# Patient Record
Sex: Female | Born: 1935 | Race: White | Hispanic: No | Smoking: Never smoker
Health system: Southern US, Community
[De-identification: ages and names within clinical notes are randomized; demographics above are authoritative.]

## PROBLEM LIST (undated history)

## (undated) DIAGNOSIS — F0280 Dementia in other diseases classified elsewhere without behavioral disturbance: Secondary | ICD-10-CM

## (undated) DIAGNOSIS — K5730 Diverticulosis of large intestine without perforation or abscess without bleeding: Secondary | ICD-10-CM

## (undated) DIAGNOSIS — E039 Hypothyroidism, unspecified: Secondary | ICD-10-CM

## (undated) DIAGNOSIS — I872 Venous insufficiency (chronic) (peripheral): Secondary | ICD-10-CM

## (undated) DIAGNOSIS — K5900 Constipation, unspecified: Secondary | ICD-10-CM

## (undated) DIAGNOSIS — E042 Nontoxic multinodular goiter: Secondary | ICD-10-CM

## (undated) DIAGNOSIS — I2510 Atherosclerotic heart disease of native coronary artery without angina pectoris: Secondary | ICD-10-CM

## (undated) DIAGNOSIS — R413 Other amnesia: Secondary | ICD-10-CM

## (undated) DIAGNOSIS — I10 Essential (primary) hypertension: Secondary | ICD-10-CM

## (undated) DIAGNOSIS — J45909 Unspecified asthma, uncomplicated: Secondary | ICD-10-CM

## (undated) DIAGNOSIS — G309 Alzheimer's disease, unspecified: Secondary | ICD-10-CM

## (undated) DIAGNOSIS — R269 Unspecified abnormalities of gait and mobility: Secondary | ICD-10-CM

## (undated) DIAGNOSIS — K219 Gastro-esophageal reflux disease without esophagitis: Secondary | ICD-10-CM

## (undated) DIAGNOSIS — M1990 Unspecified osteoarthritis, unspecified site: Secondary | ICD-10-CM

## (undated) DIAGNOSIS — E559 Vitamin D deficiency, unspecified: Secondary | ICD-10-CM

## (undated) DIAGNOSIS — E7800 Pure hypercholesterolemia, unspecified: Secondary | ICD-10-CM

## (undated) DIAGNOSIS — E785 Hyperlipidemia, unspecified: Secondary | ICD-10-CM

## (undated) DIAGNOSIS — K8020 Calculus of gallbladder without cholecystitis without obstruction: Secondary | ICD-10-CM

## (undated) DIAGNOSIS — N390 Urinary tract infection, site not specified: Secondary | ICD-10-CM

## (undated) DIAGNOSIS — G459 Transient cerebral ischemic attack, unspecified: Secondary | ICD-10-CM

## (undated) DIAGNOSIS — I671 Cerebral aneurysm, nonruptured: Secondary | ICD-10-CM

## (undated) DIAGNOSIS — F419 Anxiety disorder, unspecified: Secondary | ICD-10-CM

## (undated) DIAGNOSIS — Z9181 History of falling: Secondary | ICD-10-CM

## (undated) DIAGNOSIS — E876 Hypokalemia: Secondary | ICD-10-CM

## (undated) DIAGNOSIS — D649 Anemia, unspecified: Secondary | ICD-10-CM

## (undated) HISTORY — DX: Unspecified osteoarthritis, unspecified site: M19.90

## (undated) HISTORY — DX: Pure hypercholesterolemia, unspecified: E78.00

## (undated) HISTORY — DX: Other amnesia: R41.3

## (undated) HISTORY — DX: Transient cerebral ischemic attack, unspecified: G45.9

## (undated) HISTORY — DX: Hypothyroidism, unspecified: E03.9

## (undated) HISTORY — DX: Essential (primary) hypertension: I10

## (undated) HISTORY — DX: Cerebral aneurysm, nonruptured: I67.1

## (undated) HISTORY — DX: Atherosclerotic heart disease of native coronary artery without angina pectoris: I25.10

## (undated) HISTORY — PX: ABDOMINAL HYSTERECTOMY: SHX81

## (undated) HISTORY — DX: Anemia, unspecified: D64.9

## (undated) HISTORY — DX: Venous insufficiency (chronic) (peripheral): I87.2

## (undated) HISTORY — PX: OTHER SURGICAL HISTORY: SHX169

## (undated) HISTORY — DX: Urinary tract infection, site not specified: N39.0

## (undated) HISTORY — DX: Anxiety disorder, unspecified: F41.9

## (undated) HISTORY — DX: Unspecified asthma, uncomplicated: J45.909

## (undated) HISTORY — DX: Nontoxic multinodular goiter: E04.2

## (undated) HISTORY — PX: LUMBAR FUSION: SHX111

## (undated) HISTORY — DX: Gastro-esophageal reflux disease without esophagitis: K21.9

## (undated) HISTORY — DX: Diverticulosis of large intestine without perforation or abscess without bleeding: K57.30

---

## 1998-09-09 ENCOUNTER — Ambulatory Visit (HOSPITAL_COMMUNITY): Admission: RE | Admit: 1998-09-09 | Discharge: 1998-09-09 | Payer: Self-pay | Admitting: Gastroenterology

## 1998-10-17 ENCOUNTER — Ambulatory Visit (HOSPITAL_COMMUNITY): Admission: RE | Admit: 1998-10-17 | Discharge: 1998-10-17 | Payer: Self-pay | Admitting: Gastroenterology

## 1998-10-17 ENCOUNTER — Encounter: Payer: Self-pay | Admitting: Gastroenterology

## 1999-10-16 ENCOUNTER — Emergency Department (HOSPITAL_COMMUNITY): Admission: EM | Admit: 1999-10-16 | Discharge: 1999-10-16 | Payer: Self-pay | Admitting: Emergency Medicine

## 1999-10-31 ENCOUNTER — Emergency Department (HOSPITAL_COMMUNITY): Admission: EM | Admit: 1999-10-31 | Discharge: 1999-10-31 | Payer: Self-pay | Admitting: Emergency Medicine

## 1999-11-05 HISTORY — PX: OTHER SURGICAL HISTORY: SHX169

## 1999-11-06 ENCOUNTER — Encounter: Payer: Self-pay | Admitting: Orthopedic Surgery

## 1999-11-13 ENCOUNTER — Inpatient Hospital Stay (HOSPITAL_COMMUNITY): Admission: RE | Admit: 1999-11-13 | Discharge: 1999-11-18 | Payer: Self-pay | Admitting: Orthopedic Surgery

## 1999-11-14 ENCOUNTER — Encounter: Payer: Self-pay | Admitting: Orthopedic Surgery

## 1999-11-15 ENCOUNTER — Encounter: Payer: Self-pay | Admitting: Pulmonary Disease

## 2000-03-01 ENCOUNTER — Other Ambulatory Visit: Admission: RE | Admit: 2000-03-01 | Discharge: 2000-03-01 | Payer: Self-pay | Admitting: Gynecology

## 2001-03-10 ENCOUNTER — Other Ambulatory Visit: Admission: RE | Admit: 2001-03-10 | Discharge: 2001-03-10 | Payer: Self-pay | Admitting: Gynecology

## 2002-04-06 HISTORY — PX: OTHER SURGICAL HISTORY: SHX169

## 2002-04-12 ENCOUNTER — Inpatient Hospital Stay (HOSPITAL_COMMUNITY): Admission: EM | Admit: 2002-04-12 | Discharge: 2002-04-20 | Payer: Self-pay | Admitting: *Deleted

## 2002-04-14 ENCOUNTER — Encounter: Payer: Self-pay | Admitting: Orthopedic Surgery

## 2002-04-20 ENCOUNTER — Inpatient Hospital Stay
Admission: RE | Admit: 2002-04-20 | Discharge: 2002-05-01 | Payer: Self-pay | Admitting: Physical Medicine & Rehabilitation

## 2003-03-30 ENCOUNTER — Encounter (INDEPENDENT_AMBULATORY_CARE_PROVIDER_SITE_OTHER): Payer: Self-pay | Admitting: Specialist

## 2003-03-30 ENCOUNTER — Ambulatory Visit (HOSPITAL_COMMUNITY): Admission: RE | Admit: 2003-03-30 | Discharge: 2003-03-30 | Payer: Self-pay | Admitting: Gastroenterology

## 2004-04-26 ENCOUNTER — Inpatient Hospital Stay (HOSPITAL_COMMUNITY): Admission: EM | Admit: 2004-04-26 | Discharge: 2004-04-28 | Payer: Self-pay | Admitting: Emergency Medicine

## 2004-08-01 ENCOUNTER — Ambulatory Visit: Payer: Self-pay | Admitting: Pulmonary Disease

## 2004-08-14 ENCOUNTER — Ambulatory Visit: Payer: Self-pay | Admitting: Internal Medicine

## 2004-08-31 ENCOUNTER — Ambulatory Visit: Payer: Self-pay | Admitting: Pulmonary Disease

## 2004-10-30 ENCOUNTER — Ambulatory Visit: Payer: Self-pay | Admitting: Pulmonary Disease

## 2004-11-01 ENCOUNTER — Ambulatory Visit (HOSPITAL_COMMUNITY): Admission: RE | Admit: 2004-11-01 | Discharge: 2004-11-01 | Payer: Self-pay | Admitting: Pulmonary Disease

## 2004-11-14 ENCOUNTER — Ambulatory Visit: Payer: Self-pay | Admitting: Pulmonary Disease

## 2005-01-23 ENCOUNTER — Ambulatory Visit: Payer: Self-pay | Admitting: Pulmonary Disease

## 2005-04-25 ENCOUNTER — Ambulatory Visit: Payer: Self-pay | Admitting: Pulmonary Disease

## 2005-06-04 ENCOUNTER — Encounter: Admission: RE | Admit: 2005-06-04 | Discharge: 2005-06-04 | Payer: Self-pay | Admitting: Gastroenterology

## 2005-07-23 ENCOUNTER — Other Ambulatory Visit: Admission: RE | Admit: 2005-07-23 | Discharge: 2005-07-23 | Payer: Self-pay | Admitting: Gynecology

## 2005-07-26 ENCOUNTER — Ambulatory Visit: Payer: Self-pay | Admitting: Pulmonary Disease

## 2005-11-02 ENCOUNTER — Ambulatory Visit: Payer: Self-pay | Admitting: Pulmonary Disease

## 2005-11-27 ENCOUNTER — Ambulatory Visit: Payer: Self-pay | Admitting: Pulmonary Disease

## 2006-01-26 ENCOUNTER — Emergency Department (HOSPITAL_COMMUNITY): Admission: EM | Admit: 2006-01-26 | Discharge: 2006-01-26 | Payer: Self-pay | Admitting: Emergency Medicine

## 2006-05-28 ENCOUNTER — Ambulatory Visit: Payer: Self-pay | Admitting: Pulmonary Disease

## 2006-09-13 ENCOUNTER — Ambulatory Visit: Payer: Self-pay | Admitting: Pulmonary Disease

## 2006-09-13 LAB — CONVERTED CEMR LAB
Basophils Absolute: 0 10*3/uL (ref 0.0–0.1)
Hemoglobin: 14.8 g/dL (ref 12.0–15.0)
Lymphocytes Relative: 21.8 % (ref 12.0–46.0)
MCHC: 35 g/dL (ref 30.0–36.0)
MCV: 94 fL (ref 78.0–100.0)
Platelets: 195 10*3/uL (ref 150–400)
RBC: 4.49 M/uL (ref 3.87–5.11)
RDW: 12.2 % (ref 11.5–14.6)
TSH: 1.26 microintl units/mL (ref 0.35–5.50)

## 2006-10-17 ENCOUNTER — Ambulatory Visit: Payer: Self-pay | Admitting: Pulmonary Disease

## 2006-12-03 ENCOUNTER — Encounter: Admission: RE | Admit: 2006-12-03 | Discharge: 2006-12-03 | Payer: Self-pay | Admitting: Gastroenterology

## 2007-01-05 HISTORY — PX: LAPAROSCOPIC CHOLECYSTECTOMY: SUR755

## 2007-01-06 ENCOUNTER — Ambulatory Visit: Payer: Self-pay | Admitting: Pulmonary Disease

## 2007-01-16 ENCOUNTER — Ambulatory Visit (HOSPITAL_COMMUNITY): Admission: RE | Admit: 2007-01-16 | Discharge: 2007-01-16 | Payer: Self-pay | Admitting: General Surgery

## 2007-01-16 ENCOUNTER — Encounter (INDEPENDENT_AMBULATORY_CARE_PROVIDER_SITE_OTHER): Payer: Self-pay | Admitting: General Surgery

## 2007-04-14 ENCOUNTER — Ambulatory Visit: Payer: Self-pay | Admitting: Pulmonary Disease

## 2007-04-14 LAB — CONVERTED CEMR LAB
AST: 24 units/L (ref 0–37)
BUN: 17 mg/dL (ref 6–23)
Basophils Absolute: 0 10*3/uL (ref 0.0–0.1)
Basophils Relative: 0.3 % (ref 0.0–1.0)
Bilirubin, Direct: 0.1 mg/dL (ref 0.0–0.3)
Chloride: 111 meq/L (ref 96–112)
Creatinine, Ser: 0.9 mg/dL (ref 0.4–1.2)
Eosinophils Absolute: 0 10*3/uL (ref 0.0–0.6)
Eosinophils Relative: 0.1 % (ref 0.0–5.0)
GFR calc Af Amer: 79 mL/min
HCT: 38.7 % (ref 36.0–46.0)
Hemoglobin: 13.5 g/dL (ref 12.0–15.0)
Monocytes Absolute: 0.4 10*3/uL (ref 0.2–0.7)
Monocytes Relative: 6.4 % (ref 3.0–11.0)
Platelets: 209 10*3/uL (ref 150–400)
RDW: 12.5 % (ref 11.5–14.6)
Sodium: 148 meq/L — ABNORMAL HIGH (ref 135–145)
TSH: 0.95 microintl units/mL (ref 0.35–5.50)
Total Bilirubin: 0.7 mg/dL (ref 0.3–1.2)
Triglycerides: 204 mg/dL (ref 0–149)
WBC: 6 10*3/uL (ref 4.5–10.5)

## 2007-04-19 ENCOUNTER — Emergency Department (HOSPITAL_COMMUNITY): Admission: EM | Admit: 2007-04-19 | Discharge: 2007-04-19 | Payer: Self-pay | Admitting: Emergency Medicine

## 2007-05-05 ENCOUNTER — Ambulatory Visit: Payer: Self-pay | Admitting: Pulmonary Disease

## 2007-05-09 ENCOUNTER — Ambulatory Visit: Payer: Self-pay

## 2007-06-04 ENCOUNTER — Ambulatory Visit: Payer: Self-pay | Admitting: Pulmonary Disease

## 2007-06-26 ENCOUNTER — Ambulatory Visit: Payer: Self-pay | Admitting: Internal Medicine

## 2007-06-26 ENCOUNTER — Emergency Department (HOSPITAL_COMMUNITY): Admission: EM | Admit: 2007-06-26 | Discharge: 2007-06-26 | Payer: Self-pay | Admitting: Emergency Medicine

## 2007-06-27 ENCOUNTER — Ambulatory Visit: Payer: Self-pay

## 2007-07-01 ENCOUNTER — Other Ambulatory Visit: Admission: RE | Admit: 2007-07-01 | Discharge: 2007-07-01 | Payer: Self-pay | Admitting: Gynecology

## 2007-07-07 ENCOUNTER — Ambulatory Visit: Payer: Self-pay | Admitting: Pulmonary Disease

## 2007-07-14 ENCOUNTER — Telehealth (INDEPENDENT_AMBULATORY_CARE_PROVIDER_SITE_OTHER): Payer: Self-pay | Admitting: *Deleted

## 2007-07-15 ENCOUNTER — Telehealth: Payer: Self-pay | Admitting: Pulmonary Disease

## 2007-07-16 DIAGNOSIS — E7800 Pure hypercholesterolemia, unspecified: Secondary | ICD-10-CM

## 2007-07-16 DIAGNOSIS — F411 Generalized anxiety disorder: Secondary | ICD-10-CM | POA: Insufficient documentation

## 2007-07-16 DIAGNOSIS — I2510 Atherosclerotic heart disease of native coronary artery without angina pectoris: Secondary | ICD-10-CM | POA: Insufficient documentation

## 2007-07-16 DIAGNOSIS — I10 Essential (primary) hypertension: Secondary | ICD-10-CM

## 2007-07-16 DIAGNOSIS — M1990 Unspecified osteoarthritis, unspecified site: Secondary | ICD-10-CM | POA: Insufficient documentation

## 2007-07-16 DIAGNOSIS — D649 Anemia, unspecified: Secondary | ICD-10-CM

## 2007-07-16 DIAGNOSIS — N390 Urinary tract infection, site not specified: Secondary | ICD-10-CM

## 2007-07-16 DIAGNOSIS — K219 Gastro-esophageal reflux disease without esophagitis: Secondary | ICD-10-CM

## 2007-09-02 ENCOUNTER — Telehealth (INDEPENDENT_AMBULATORY_CARE_PROVIDER_SITE_OTHER): Payer: Self-pay | Admitting: *Deleted

## 2007-09-03 ENCOUNTER — Ambulatory Visit: Payer: Self-pay | Admitting: Pulmonary Disease

## 2007-09-03 DIAGNOSIS — G459 Transient cerebral ischemic attack, unspecified: Secondary | ICD-10-CM | POA: Insufficient documentation

## 2007-09-08 ENCOUNTER — Ambulatory Visit: Payer: Self-pay | Admitting: Internal Medicine

## 2007-09-09 ENCOUNTER — Telehealth: Payer: Self-pay | Admitting: Pulmonary Disease

## 2007-09-23 ENCOUNTER — Ambulatory Visit: Payer: Self-pay | Admitting: Pulmonary Disease

## 2007-09-23 DIAGNOSIS — K5730 Diverticulosis of large intestine without perforation or abscess without bleeding: Secondary | ICD-10-CM | POA: Insufficient documentation

## 2007-09-23 DIAGNOSIS — R413 Other amnesia: Secondary | ICD-10-CM | POA: Insufficient documentation

## 2007-10-15 ENCOUNTER — Ambulatory Visit: Payer: Self-pay | Admitting: Pulmonary Disease

## 2007-10-20 LAB — CONVERTED CEMR LAB
AST: 29 units/L (ref 0–37)
Albumin: 3.8 g/dL (ref 3.5–5.2)
Basophils Absolute: 0.1 10*3/uL (ref 0.0–0.1)
Basophils Relative: 1 % (ref 0.0–1.0)
Bilirubin, Direct: 0.1 mg/dL (ref 0.0–0.3)
Calcium: 9.9 mg/dL (ref 8.4–10.5)
Cholesterol: 143 mg/dL (ref 0–200)
Eosinophils Absolute: 0.1 10*3/uL (ref 0.0–0.6)
Glucose, Bld: 103 mg/dL — ABNORMAL HIGH (ref 70–99)
HCT: 41.2 % (ref 36.0–46.0)
HDL: 33 mg/dL — ABNORMAL LOW (ref >39.0)
Lymphocytes Relative: 30.4 % (ref 12.0–46.0)
MCHC: 33.4 g/dL (ref 30.0–36.0)
Monocytes Relative: 6.9 % (ref 3.0–11.0)
Neutro Abs: 3.5 10*3/uL (ref 1.4–7.7)
Neutrophils Relative %: 60.1 % (ref 43.0–77.0)
Platelets: 196 10*3/uL (ref 150–400)
Sodium: 144 meq/L (ref 135–145)
TSH: 0.98 microintl units/mL (ref 0.35–5.50)
Total CHOL/HDL Ratio: 4.3

## 2007-11-19 ENCOUNTER — Telehealth (INDEPENDENT_AMBULATORY_CARE_PROVIDER_SITE_OTHER): Payer: Self-pay | Admitting: *Deleted

## 2007-11-21 ENCOUNTER — Ambulatory Visit: Payer: Self-pay | Admitting: Internal Medicine

## 2007-11-24 LAB — CONVERTED CEMR LAB
Folate: >20 ng/mL
Sed Rate: 14 mm/hr (ref 0–22)
Vitamin B-12: 624 pg/mL (ref 211–911)

## 2007-12-24 ENCOUNTER — Ambulatory Visit: Payer: Self-pay | Admitting: Pulmonary Disease

## 2008-02-24 ENCOUNTER — Ambulatory Visit: Payer: Self-pay | Admitting: Internal Medicine

## 2008-03-11 ENCOUNTER — Emergency Department (HOSPITAL_COMMUNITY): Admission: EM | Admit: 2008-03-11 | Discharge: 2008-03-11 | Payer: Self-pay | Admitting: Emergency Medicine

## 2008-03-11 ENCOUNTER — Telehealth (INDEPENDENT_AMBULATORY_CARE_PROVIDER_SITE_OTHER): Payer: Self-pay | Admitting: *Deleted

## 2008-03-19 ENCOUNTER — Encounter: Payer: Self-pay | Admitting: Pulmonary Disease

## 2008-03-24 ENCOUNTER — Telehealth (INDEPENDENT_AMBULATORY_CARE_PROVIDER_SITE_OTHER): Payer: Self-pay | Admitting: *Deleted

## 2008-04-30 ENCOUNTER — Encounter: Payer: Self-pay | Admitting: Pulmonary Disease

## 2008-05-25 ENCOUNTER — Ambulatory Visit: Payer: Self-pay | Admitting: Pulmonary Disease

## 2008-05-25 DIAGNOSIS — E039 Hypothyroidism, unspecified: Secondary | ICD-10-CM

## 2008-05-25 DIAGNOSIS — J209 Acute bronchitis, unspecified: Secondary | ICD-10-CM | POA: Insufficient documentation

## 2008-05-26 ENCOUNTER — Encounter: Payer: Self-pay | Admitting: Pulmonary Disease

## 2008-05-26 LAB — CONVERTED CEMR LAB
Ketones, ur: NEGATIVE mg/dL
Mucus, UA: NEGATIVE
Nitrite: NEGATIVE
Specific Gravity, Urine: 1.015 (ref 1.000–1.03)
Total Protein, Urine: NEGATIVE mg/dL
Urine Glucose: NEGATIVE mg/dL
Urobilinogen, UA: 0.2 (ref 0.0–1.0)

## 2008-06-02 ENCOUNTER — Telehealth (INDEPENDENT_AMBULATORY_CARE_PROVIDER_SITE_OTHER): Payer: Self-pay | Admitting: *Deleted

## 2008-07-05 ENCOUNTER — Encounter: Payer: Self-pay | Admitting: Pulmonary Disease

## 2008-08-17 ENCOUNTER — Encounter: Payer: Self-pay | Admitting: Adult Health

## 2008-08-18 ENCOUNTER — Encounter: Payer: Self-pay | Admitting: Pulmonary Disease

## 2008-08-20 ENCOUNTER — Ambulatory Visit: Payer: Self-pay | Admitting: Internal Medicine

## 2008-08-20 ENCOUNTER — Encounter: Payer: Self-pay | Admitting: Adult Health

## 2008-08-20 DIAGNOSIS — J309 Allergic rhinitis, unspecified: Secondary | ICD-10-CM | POA: Insufficient documentation

## 2008-08-24 LAB — CONVERTED CEMR LAB
AST: 31 units/L (ref 0–37)
Albumin: 3.7 g/dL (ref 3.5–5.2)
Alkaline Phosphatase: 87 units/L (ref 39–117)
BUN: 15 mg/dL (ref 6–23)
CO2: 32 meq/L (ref 19–32)
Calcium: 9.6 mg/dL (ref 8.4–10.5)
Chloride: 104 meq/L (ref 96–112)
Creatinine, Ser: 1 mg/dL (ref 0.4–1.2)
GFR calc non Af Amer: 58 mL/min
HDL: 38.9 mg/dL — ABNORMAL LOW (ref >39.0)
Ketones, ur: NEGATIVE mg/dL
Nitrite: NEGATIVE
Potassium: 4.4 meq/L (ref 3.5–5.1)
Sodium: 142 meq/L (ref 135–145)
Specific Gravity, Urine: 1.02 (ref 1.000–1.03)
Total Protein, Urine: 30 mg/dL — AB
Total Protein: 6.6 g/dL (ref 6.0–8.3)
Triglycerides: 162 mg/dL — ABNORMAL HIGH (ref 0–149)
VLDL: 32 mg/dL (ref 0–40)
pH: 5.5 (ref 5.0–8.0)

## 2008-09-03 ENCOUNTER — Telehealth (INDEPENDENT_AMBULATORY_CARE_PROVIDER_SITE_OTHER): Payer: Self-pay | Admitting: *Deleted

## 2008-09-06 ENCOUNTER — Telehealth (INDEPENDENT_AMBULATORY_CARE_PROVIDER_SITE_OTHER): Payer: Self-pay | Admitting: *Deleted

## 2008-09-24 ENCOUNTER — Telehealth: Payer: Self-pay | Admitting: Pulmonary Disease

## 2008-09-24 ENCOUNTER — Ambulatory Visit: Payer: Self-pay | Admitting: Pulmonary Disease

## 2008-10-27 ENCOUNTER — Ambulatory Visit: Payer: Self-pay | Admitting: Pulmonary Disease

## 2008-11-18 ENCOUNTER — Ambulatory Visit: Payer: Self-pay | Admitting: Pulmonary Disease

## 2008-11-18 ENCOUNTER — Telehealth (INDEPENDENT_AMBULATORY_CARE_PROVIDER_SITE_OTHER): Payer: Self-pay | Admitting: *Deleted

## 2008-11-18 DIAGNOSIS — R209 Unspecified disturbances of skin sensation: Secondary | ICD-10-CM

## 2008-11-22 ENCOUNTER — Ambulatory Visit (HOSPITAL_COMMUNITY): Admission: RE | Admit: 2008-11-22 | Discharge: 2008-11-22 | Payer: Self-pay | Admitting: Pulmonary Disease

## 2008-11-24 LAB — CONVERTED CEMR LAB
BUN: 21 mg/dL (ref 6–23)
Calcium: 9.6 mg/dL (ref 8.4–10.5)
Chloride: 102 meq/L (ref 96–112)
Creatinine, Ser: 1.1 mg/dL (ref 0.4–1.2)

## 2008-12-29 ENCOUNTER — Telehealth (INDEPENDENT_AMBULATORY_CARE_PROVIDER_SITE_OTHER): Payer: Self-pay | Admitting: *Deleted

## 2009-01-11 ENCOUNTER — Encounter: Payer: Self-pay | Admitting: Pulmonary Disease

## 2009-02-28 ENCOUNTER — Telehealth (INDEPENDENT_AMBULATORY_CARE_PROVIDER_SITE_OTHER): Payer: Self-pay | Admitting: *Deleted

## 2009-03-02 ENCOUNTER — Ambulatory Visit: Payer: Self-pay | Admitting: Pulmonary Disease

## 2009-03-06 LAB — CONVERTED CEMR LAB
ALT: 21 units/L (ref 0–35)
Albumin: 3.8 g/dL (ref 3.5–5.2)
Basophils Relative: 0.5 % (ref 0.0–3.0)
Bilirubin, Direct: 0.1 mg/dL (ref 0.0–0.3)
Calcium: 9.5 mg/dL (ref 8.4–10.5)
Creatinine, Ser: 1 mg/dL (ref 0.4–1.2)
Eosinophils Relative: 2.9 % (ref 0.0–5.0)
Lymphocytes Relative: 31.5 % (ref 12.0–46.0)
Lymphs Abs: 1.5 10*3/uL (ref 0.7–4.0)
MCHC: 34.9 g/dL (ref 30.0–36.0)
MCV: 95.6 fL (ref 78.0–100.0)
Neutro Abs: 2.7 10*3/uL (ref 1.4–7.7)
Neutrophils Relative %: 56.9 % (ref 43.0–77.0)
Platelets: 185 10*3/uL (ref 150.0–400.0)
Potassium: 4.2 meq/L (ref 3.5–5.1)
RBC: 4.2 M/uL (ref 3.87–5.11)
RDW: 12.1 % (ref 11.5–14.6)
Sed Rate: 14 mm/hr (ref 0–22)
Sodium: 145 meq/L (ref 135–145)
Total CHOL/HDL Ratio: 4
Total Protein: 6.9 g/dL (ref 6.0–8.3)
Vit D, 25-Hydroxy: 31 ng/mL (ref 30–89)
WBC: 4.7 10*3/uL (ref 4.5–10.5)

## 2009-06-13 ENCOUNTER — Ambulatory Visit: Payer: Self-pay | Admitting: Pulmonary Disease

## 2009-06-15 ENCOUNTER — Telehealth: Payer: Self-pay | Admitting: Pulmonary Disease

## 2009-06-16 ENCOUNTER — Encounter: Payer: Self-pay | Admitting: Pulmonary Disease

## 2009-06-17 ENCOUNTER — Encounter: Payer: Self-pay | Admitting: Pulmonary Disease

## 2009-06-20 ENCOUNTER — Encounter: Payer: Self-pay | Admitting: Pulmonary Disease

## 2009-06-23 ENCOUNTER — Ambulatory Visit: Payer: Self-pay | Admitting: Internal Medicine

## 2009-07-04 ENCOUNTER — Telehealth (INDEPENDENT_AMBULATORY_CARE_PROVIDER_SITE_OTHER): Payer: Self-pay | Admitting: *Deleted

## 2009-07-06 ENCOUNTER — Telehealth (INDEPENDENT_AMBULATORY_CARE_PROVIDER_SITE_OTHER): Payer: Self-pay | Admitting: *Deleted

## 2009-10-19 ENCOUNTER — Encounter: Payer: Self-pay | Admitting: Pulmonary Disease

## 2009-11-11 ENCOUNTER — Ambulatory Visit: Payer: Self-pay | Admitting: Pulmonary Disease

## 2009-11-11 LAB — CONVERTED CEMR LAB
AST: 28 units/L (ref 0–37)
BUN: 18 mg/dL (ref 6–23)
CO2: 30 meq/L (ref 19–32)
Chloride: 107 meq/L (ref 96–112)
Cholesterol: 154 mg/dL (ref 0–200)
Eosinophils Relative: 2.8 % (ref 0.0–5.0)
Glucose, Bld: 118 mg/dL — ABNORMAL HIGH (ref 70–99)
HCT: 42.6 % (ref 36.0–46.0)
HDL: 44 mg/dL (ref >39.00)
Hemoglobin: 14.7 g/dL (ref 12.0–15.0)
Lymphocytes Relative: 27.6 % (ref 12.0–46.0)
Lymphs Abs: 1.5 10*3/uL (ref 0.7–4.0)
MCV: 97 fL (ref 78.0–100.0)
Monocytes Absolute: 0.4 10*3/uL (ref 0.1–1.0)
Monocytes Relative: 7.7 % (ref 3.0–12.0)
Neutro Abs: 3.3 10*3/uL (ref 1.4–7.7)
Platelets: 200 10*3/uL (ref 150.0–400.0)
Potassium: 4.2 meq/L (ref 3.5–5.1)
RDW: 12.8 % (ref 11.5–14.6)
TSH: 1.39 microintl units/mL (ref 0.35–5.50)

## 2009-11-16 ENCOUNTER — Encounter: Payer: Self-pay | Admitting: Pulmonary Disease

## 2009-12-25 ENCOUNTER — Emergency Department (HOSPITAL_COMMUNITY): Admission: EM | Admit: 2009-12-25 | Discharge: 2009-12-26 | Payer: Self-pay | Admitting: Emergency Medicine

## 2009-12-26 ENCOUNTER — Telehealth (INDEPENDENT_AMBULATORY_CARE_PROVIDER_SITE_OTHER): Payer: Self-pay | Admitting: *Deleted

## 2010-01-09 ENCOUNTER — Ambulatory Visit: Payer: Self-pay | Admitting: Pulmonary Disease

## 2010-01-09 DIAGNOSIS — I872 Venous insufficiency (chronic) (peripheral): Secondary | ICD-10-CM | POA: Insufficient documentation

## 2010-02-24 ENCOUNTER — Telehealth (INDEPENDENT_AMBULATORY_CARE_PROVIDER_SITE_OTHER): Payer: Self-pay | Admitting: *Deleted

## 2010-02-26 ENCOUNTER — Emergency Department (HOSPITAL_COMMUNITY): Admission: EM | Admit: 2010-02-26 | Discharge: 2010-02-26 | Payer: Self-pay | Admitting: Emergency Medicine

## 2010-02-27 ENCOUNTER — Telehealth: Payer: Self-pay | Admitting: Pulmonary Disease

## 2010-02-28 ENCOUNTER — Ambulatory Visit: Payer: Self-pay | Admitting: Pulmonary Disease

## 2010-02-28 ENCOUNTER — Encounter: Payer: Self-pay | Admitting: Adult Health

## 2010-03-01 ENCOUNTER — Telehealth (INDEPENDENT_AMBULATORY_CARE_PROVIDER_SITE_OTHER): Payer: Self-pay | Admitting: *Deleted

## 2010-03-01 LAB — CONVERTED CEMR LAB
Ketones, ur: NEGATIVE mg/dL
Nitrite: NEGATIVE
Specific Gravity, Urine: 1.015 (ref 1.000–1.030)
Total Protein, Urine: NEGATIVE mg/dL

## 2010-03-03 ENCOUNTER — Encounter: Payer: Self-pay | Admitting: Pulmonary Disease

## 2010-03-07 ENCOUNTER — Ambulatory Visit (HOSPITAL_COMMUNITY): Admission: RE | Admit: 2010-03-07 | Discharge: 2010-03-07 | Payer: Self-pay | Admitting: Pulmonary Disease

## 2010-03-10 ENCOUNTER — Encounter: Payer: Self-pay | Admitting: Pulmonary Disease

## 2010-03-10 ENCOUNTER — Encounter: Payer: Self-pay | Admitting: Cardiovascular Disease

## 2010-03-10 ENCOUNTER — Ambulatory Visit: Payer: Self-pay

## 2010-03-14 ENCOUNTER — Telehealth: Payer: Self-pay | Admitting: Pulmonary Disease

## 2010-03-17 ENCOUNTER — Ambulatory Visit (HOSPITAL_COMMUNITY): Admission: RE | Admit: 2010-03-17 | Discharge: 2010-03-17 | Payer: Self-pay | Admitting: Pulmonary Disease

## 2010-03-20 ENCOUNTER — Telehealth (INDEPENDENT_AMBULATORY_CARE_PROVIDER_SITE_OTHER): Payer: Self-pay | Admitting: *Deleted

## 2010-03-27 ENCOUNTER — Telehealth (INDEPENDENT_AMBULATORY_CARE_PROVIDER_SITE_OTHER): Payer: Self-pay | Admitting: *Deleted

## 2010-03-29 ENCOUNTER — Encounter: Admission: RE | Admit: 2010-03-29 | Discharge: 2010-03-29 | Payer: Self-pay | Admitting: Pulmonary Disease

## 2010-03-29 ENCOUNTER — Other Ambulatory Visit: Admission: RE | Admit: 2010-03-29 | Discharge: 2010-03-29 | Payer: Self-pay | Admitting: Interventional Radiology

## 2010-03-29 ENCOUNTER — Encounter: Payer: Self-pay | Admitting: Adult Health

## 2010-03-29 ENCOUNTER — Encounter: Payer: Self-pay | Admitting: Pulmonary Disease

## 2010-05-11 ENCOUNTER — Ambulatory Visit: Payer: Self-pay | Admitting: Pulmonary Disease

## 2010-05-13 DIAGNOSIS — E042 Nontoxic multinodular goiter: Secondary | ICD-10-CM | POA: Insufficient documentation

## 2010-05-13 DIAGNOSIS — I671 Cerebral aneurysm, nonruptured: Secondary | ICD-10-CM

## 2010-05-24 ENCOUNTER — Encounter: Payer: Self-pay | Admitting: Pulmonary Disease

## 2010-05-31 ENCOUNTER — Telehealth: Payer: Self-pay | Admitting: Pulmonary Disease

## 2010-06-08 ENCOUNTER — Telehealth (INDEPENDENT_AMBULATORY_CARE_PROVIDER_SITE_OTHER): Payer: Self-pay | Admitting: *Deleted

## 2010-06-12 ENCOUNTER — Ambulatory Visit: Payer: Self-pay | Admitting: Pulmonary Disease

## 2010-06-13 ENCOUNTER — Encounter: Payer: Self-pay | Admitting: Pulmonary Disease

## 2010-07-06 ENCOUNTER — Telehealth (INDEPENDENT_AMBULATORY_CARE_PROVIDER_SITE_OTHER): Payer: Self-pay | Admitting: *Deleted

## 2010-07-20 ENCOUNTER — Telehealth: Payer: Self-pay | Admitting: Internal Medicine

## 2010-07-21 ENCOUNTER — Telehealth (INDEPENDENT_AMBULATORY_CARE_PROVIDER_SITE_OTHER): Payer: Self-pay | Admitting: *Deleted

## 2010-08-04 ENCOUNTER — Ambulatory Visit: Payer: Self-pay | Admitting: Internal Medicine

## 2010-08-04 ENCOUNTER — Encounter: Payer: Self-pay | Admitting: Internal Medicine

## 2010-09-05 ENCOUNTER — Encounter: Payer: Self-pay | Admitting: Pulmonary Disease

## 2010-09-05 NOTE — Progress Notes (Signed)
Summary: rx refills  Phone Note Call from Patient Call back at Home Phone [phone]   Caller: Patient Call For: nadel Reason for Call: Talk to Nurse Summary of Call: Need refills on all her meds except X-Forge and Plavix. [facility] Initial call taken by: Eugene Gavia,  February 24, 2010 10:58 AM  Follow-up for Phone Call        called to speak with patient to verify all meds that need to be refilled.  pt stated she had someone "beeping" in and requested to be called back. Boone Master CNA/MA  February 24, 2010 11:21 AM   LMOMTCB [name]  February 24, 2010 11:38 AM   Additional Follow-up for Phone Call Additional follow up Details #1::        Spoke with pt and verified the rxs needed.  These were called to [facility].  Additional Follow-up by: [name],  February 24, 2010 11:59 AM    Prescriptions: ALPRAZOLAM 0.5 MG  TABS (ALPRAZOLAM) Take 1 tablet by mouth two times a day  #180 x 1   Entered by:   [name]   Authorized by:   Michele Mcalpine MD   Signed by:   [name] on 02/24/2010   Method used:   Telephoned to ...       [facility] (retail)       [address] [place], [place]  [address]       Ph: [phone]       Fax: [phone]   RxID:   [id number] AMITRIPTYLINE HCL 25 MG  TABS (AMITRIPTYLINE HCL) Take 1 tab by mouth at bedtime  #90 x 3   Entered by:   [name]   Authorized by:   Michele Mcalpine MD   Signed by:   [name] on 02/24/2010   Method used:   Telephoned to ...       [facility] ([facility])       [address] [place], [place]  [address]       Ph: [phone]       Fax: [phone]   RxID:   [id number] ARICEPT 10 MG  TABS (DONEPEZIL HCL) take 1 tab by mouth once daily...  #90 x 3   Entered by:   [name]   Authorized by:   Michele Mcalpine MD   Signed by:   [name] on 02/24/2010   Method used:   Telephoned to ...       [facility] ([facility])       [address] [place],  [place]  [address]       Ph: [phone]       Fax: [phone]   RxID:   [id number] LEVOTHYROXINE SODIUM 75 MCG  TABS (LEVOTHYROXINE SODIUM) Take 1 tablet by mouth once a day  #90 x 3   Entered by:   [name]   Authorized by:   Michele Mcalpine MD   Signed by:   [name] on 02/24/2010   Method used:   Telephoned to ...       [facility] ([facility])       [address] [place], [place]  [address]       Ph: [phone]       Fax: [phone]   RxID:   [id number] SIMVASTATIN 40 MG  TABS (SIMVASTATIN) Take 1 tablet by mouth once a day  #90 x 3   Entered by:   [name]   Authorized by:   Michele Mcalpine MD   Signed by:   [name] on 02/24/2010   Method used:   Telephoned to ...       [facility] ([facility])       [address] [place], [place]  [address]       Ph: [phone]       Fax: [phone]   RxID:   [id number] KLOR-CON M20 20 MEQ  TBCR (POTASSIUM CHLORIDE CRYS CR) Take 1 tablet by mouth once a day  #90 x 3   Entered by:   [name]   Authorized by:   Michele Mcalpine MD   Signed by:   [name] on 02/24/2010   Method used:   Telephoned to ...       [facility] ([facility])       [address] [place], [place]  [address]       Ph: [phone]       Fax: [phone]   RxID:   [id number] FUROSEMIDE 40 MG  TABS (FUROSEMIDE) Take 1 tablet by mouth once a day  #90 x 3   Entered by:   [name]   Authorized by:   Michele Mcalpine MD   Signed by:   [name] on 02/24/2010   Method used:   Telephoned to ...       [facility] ([facility])       [address] [place], [place]  [address]       Ph: [phone]       Fax: [phone]   RxID:   [id number] ISOSORBIDE MONONITRATE CR 30 MG  TB24 (ISOSORBIDE MONONITRATE) 1/2 tab once daily  #45 x 3   Entered by:   [name]   Authorized by:   Michele Mcalpine MD   Signed by:   [name] on 02/24/2010   Method used:   Telephoned to ...       [facility])       [address] [place], [place]  [address]       Ph: [phone]       Fax: [phone]   RxID:   [id number] TOPROL XL 100 MG  TB24 (METOPROLOL SUCCINATE) 1 by mouth once daily  #90 x 3   Entered by:   [name]   Authorized by:   Michele Mcalpine MD   Signed by:   [name] on 02/24/2010   Method used:   Telephoned to ...       [facility] ([facility]ail)       [address] [place], [place]  [address]       Ph: [phone]       Fax: [phone]   RxID:   [id number]

## 2010-09-05 NOTE — Progress Notes (Signed)
Summary: FYI  Phone Note Call from Patient Call back at [phone]   Caller: Daughter Andrea Mccoy Call For: NADEL Summary of Call: PT WENT TO ER FOR NUMBNESS IN HAND AND MOUTH/ WAS DIAG WITH URINARY INFECTION . DOES PT NEED TO BE SEEN BY DR NADEL Initial call taken by: Rickard Patience,  Dec 26, 2009 9:15 AM  Follow-up for Phone Call        Daughter c/o pt having shakes "to the point of jerking", decrease in appetite, mouth and hand numbness on yesterday. Pt was taken to the ER and had EKG, labs, and x-rays done. Pt is being treated with Bactrim DS two times a day. Today pt still c/o fingers and area around lips numb, no pain or other complaints. Does pt need to come in for HFU? If so when and where?  Thanks. [name] CMA  Dec 26, 2009 10:57 AM   Additional Follow-up for Phone Call Additional follow up Details #1::        per SN---use the alprazolam 0.5mg   1 by mouth three times a day regularly three times a day --rov with SN in 2-3 weeks--6-6 at 3:30.  thanks [name] CMA  Dec 26, 2009 2:21 PM     Additional Follow-up for Phone Call Additional follow up Details #2::    Spoke with pt's daughter Andrea Mccoy and advised her of the above recs per SN.  Appt was sched for 01/09/10 at 3:30 pm. Follow-up by: [name],  Dec 26, 2009 2:25 PM

## 2010-09-05 NOTE — Progress Notes (Signed)
Summary: questions for nurse  Phone Note Call from Patient Call back at Home Phone [phone]   Caller: Patient Call For: nadel Summary of Call: Pt wants to know it it's ok to eat and take meds before her biopsy on Wed.  Follow-up for Phone Call        Pt having thyroid bx on 03/29/10- wants to know if she can eat and take her meds same day before procedure.  Pls advise, thanks! Follow-up by: [name],  March 27, 2010 2:40 PM  Additional Follow-up for Phone Call Additional follow up Details #1::        per SN---ok meds with water and clear liquids for breakfast only.  thanks [name] CMA  March 27, 2010 3:49 PM     Additional Follow-up for Phone Call Additional follow up Details #2::    Spoke with pt and notified of the above recs per SN.  Pt verbalized understanding. Follow-up by: [name],  March 27, 2010 3:56 PM

## 2010-09-05 NOTE — Progress Notes (Signed)
Summary: DRUG INTERACTIONS- PHARM CALLING  Phone Note From Pharmacy   Caller: CONNIE W/ [facility] Call For: NADEL/ TP  Summary of Call: REQUESTS TO SPEAK TO NURSE RE: SIMVASTATIN AND AMLODIPINE (COMBINATION MAY CAUSE MYOPATHY PER CALLER). ALSO DOSAGE QUESTION RE: 40MG  OF SIMVASTATIN. [phone]. REF # [id number] Initial call taken by: Tivis Ringer, CNA,  March 01, 2010 11:14 AM  Follow-up for Phone Call        Okay to fill simvastatin and will forward msg so SN is aware of possible myopathy with combo of simvastatin and exforge. We will call or send new RX if any changes are made by SN. Michel Bickers [facility]  March 01, 2010 11:25 AM  called spoke with pharmacy to inform them that SN is out of the office until 8.1.11 - we will let him know when he returns to the office, but was told by pharmacist that Lawson Fiscal already called and told them that it is okay for pt to continue taking the simvastatin and amlodipine.  *note: these are NOT new meds for pt.  will sign off on msg and forward to [facility] for SN's review.  Follow-up by: Boone Master CNA/MA,  March 01, 2010 5:14 PM

## 2010-09-05 NOTE — Progress Notes (Signed)
Summary: ultrasound  Phone Note Other Incoming   Caller: daughter--regina--[phone] Summary of Call: Patient was called and given results of thryoid findings yesterday and she said that she wanted to hold off on [place].  Daughter, Rene Kocher, now calling asking for [place] to be scheduled. Initial call taken by: Lehman Prom,  March 14, 2010 12:27 PM  Follow-up for Phone Call        called and spoke with pt's daughter, Rene Kocher.  Rene Kocher states pt just spoke with TP today regarding doppler results.  Rene Kocher states TP wanted to hold off on [place] for now.  Rene Kocher states both pt and her would like to go ahead and schedule [place].  Will forward message to TP to address. Arman Filter LPN  March 14, 2010 1:12 PM   Additional Follow-up for Phone Call Additional follow up Details #1::        needs ov w/ Dr. Kriste Basque after [place]  pt had declined [place] previously as recommended  set up [place] of thryoid  Additional Follow-up by: Rubye Oaks NP,  March 14, 2010 7:21 PM    Additional Follow-up for Phone Call Additional follow up Details #2::    [place] set up for Friday, Aug 12 at 2:15.  Daughter requesting OV for that evening with SN but doesn't look like he is in the office.  Leigh, pls advise where we can work pt in.  Thanks! Gweneth Dimitri RN  March 15, 2010 9:19 AM   called and spoke with regina and explained the process of the ultrasound---pt is scheduled for [place] on friday and i told her that we will call with the results as soon as they are back---and if SN feels that biopsy or other tests are needed then we will do these on an outpt status.  regina voiced her understanding of this  and will call if any problems [name] CMA  March 15, 2010 9:40 AM

## 2010-09-05 NOTE — Assessment & Plan Note (Signed)
Summary: rov//lmr   Primary Care Provider:  Dr Kriste Basque  CC:  Office ROV- post ER visit 12/26/09....  History of Present Illness: [name] here for a follow up visit... she has multiple medical problems as noted below...    ~  Apr10:  pt fell in her yard 2d ago & laid there for 2H before help arrived- no apparent injury... she's had bilat THRs and right TKR in past> we discussed PhysTherapy for her...   She awoke today w/ numbness in her face bilaterally and in both arms- mostly hands... denies weakness anywhere, speech prob, etc... she notes hands stay numb most of the time & we discussed CTS & need for wrist splints... she had similar numbness & paresthesias in the past w/ neuro eval DrWeymann in 1996 w/ neg MRI (?TIA)... also seen 4/09 w/ similar symptoms & labs all normal- she was reassured and symptoms improved... 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 Plavix added.  ~  Jul10:  she is c/o incr edema- has VI weight up 5# on low sodium diet & Lasix 40mg /d... also notes sweating, bruising, no appetite, no energy, etc...   ~  Nov10:  41mo f/u doing satis- wt up 2# to 198#, she has 1-2+ edema but NOT restricting salt... BP controlled, prev Chol looked great on meds, stable neurologically... OK flu shot today.   ~  November 11, 2009:  she states "I feel pretty good" w/o new complaints or concerns today x bruising w/ thin skin & her ASA/ Plavix... BP controlled on meds;  no angina w/ activities;  Chol controlled on the Simva40;  & stable on her other meds as listed...   ~  January 09, 2010:  Post ER f/u> seen in ER 12/25/09 w/ mult symptoms- CC= "weakness", n/v, constip, shaking, perioral numbness, etc... CXR/ Abd films= neg;  Labs all WNL x UTI> Rx Bacterim... Improved, still sl weak, wants something for dry cough... note: some of ER presentation symptoms sound like hypervent  syndrome> encouraged to take the Alpraz 0.5mg  Bid regularly.   Current Problem List  ASTHMATIC BRONCHITIS, ACUTE (ICD-466.0) - she is a never smoker...  breathing has been good... but c/o dry cough, min yellow sputum, she wants cough syrup- OK Hydromet Prn + Mucinex DM 2Bid w/ fluids...  HYPERTENSION (ICD-401.9) - controlled on TOPROL XL 100/d,  EXFORGE 5-160 daily,  LASIX 40mg /d, KCl 55mEq/d, & IMDUR 30mg - 1/2tab/d... BP today= 114/72 today & even better at home- notes reduced symptoms of lightheadedness & denies HA, visual changes, CP, palipit, syncope, edema, etc...  ~  labs 4/11 showed normal BMet... & norm labs in ER 5/11 x BS=189.  ARTERIOSCLEROTIC HEART DISEASE (ICD-414.00) - on IMDUR 30mg - 1/2 tab daily, plus ASA 81mg /d & PLAVIX 75mg /d...  ~  cath 1998 & 2003 w/ LAD lesion ~50%, no other abn seen...  ~  neg Myoview 11/08 w/o ischemia and EF=83%.  ~  saw DrRoss on 2/09 w/ review of 11/08 hosp for atypical CP...  doing satis without recurrent CP but too sedentary and difficulty getting around w/ her arthritis...  ~  last saw DrRoss Jan10- stable, same meds.  VENOUS INSUFFICIENCY, CHRONIC (ICD-459.81) - she has chronic venous insuffic changes in her LE's w/ edema, bruising, etc... she knows to elim sodium, elevate legs, wear support hose when able, etc...  HYPERCHOLESTEROLEMIA (ICD-272.0) - on SIMVASTATIN 40mg /d &  FISH OIL...  ~  FLP 9/08 showed TChol 144, TG 204, HDL 34, LDL 72  ~  FLP 3/09 showed TChol 143, TG 178, HDL 33, LDL 74... rec- contin meds, incr exercise.  ~  FLP 1/10 showed TChol 150, TG 162, HDL 39, LDL 79... DrRoss rec same meds, better diet.  ~  FLP 7/10 showed TChol 143, TG 176, HDL 37, LDL 71  ~  FLP 4/11 showed TChol 154, TG 154, HDL 44, LDL 79  HYPOTHYROIDISM (ICD-244.9) - on LEVOTHYROID 107mcg/d...  ~  labs 3/09 showed TSH= 0.98  ~  labs 7/10 showed TSH= 1.37  ~  labs 4/11 showed TSH= 1.39  GERD (ICD-530.81) - EGD [facility] 6/06 showed HH, mild gastritis...  ~   last saw [facility] Aug09 w/ Carafate added (off now).  DIVERTICULOSIS OF COLON (ICD-562.10) - followed by [facility] and last colonoscopy was 4/08 showing divertics, 2 sm polyps (tubular adenomas), & hems...  CHOLELITHIASIS (ICD-574.20) - seen on prev CTAbd 4/08... s/p lap chole 6/08 by DrHoxworth...  URINARY TRACT INFECTION, CHRONIC (ICD-599.0) - mult cultures in 2009 showed sens EColi & Rx w/ Cipro... we discussed the need for Urology eval if UTI's continue to recur (for further eval).  ~  5/11:  ER eval for mult symptoms and found to have a UTI- treated w/ Bacterim & resolved.  DEGENERATIVE JOINT DISEASE (ICD-715.90) - she has severe DJD w/ prev bilat THR, right TKR, and left femur fx w/ pinning 2003... c/o difficulty w/ ambulation, exercise, etc... she takes Glucoamine Bid + MVI etc...  ~  labs 7/10 showed Vit D level = 31... rec> start Vit D 1000 u daily.  TIA (ICD-435.9) - she had left side numbness in 1996 and saw DrWeymann... MRI was neg... Rx w/ ASA... seen by TParrett,NP 4/09 w/ numbness & paresthesias- norm sed, B12, folate... improved w/ reassurance.  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 PLAVIX 75mg /d added.  MEMORY LOSS (ICD-780.93) - started on ARICEPT 2/09 and sl better over time... she does her own meds w/ daughter checking on her...  ANXIETY (ICD-300.00) - on ALPRAZOLAM 0.5mg  Bid,  & AMITRIPTYLINE 25mg Qhs...  Hx of ANEMIA (ICD-285.9) - Hx of anemia in the past... Hg has remained WNL since then...  ~  labs 7/10 showed Hg= 14.0  ~  labs 4/11 showed Hg= 14.7   Preventive Screening-Counseling & Management  Alcohol-Tobacco     Smoking Status: never  Allergies: 1)  ! Floxin 2)  ! Morphine  Past History:  Past Medical History: ALLERGIC RHINITIS (ICD-477.9) Hx of ASTHMATIC BRONCHITIS, ACUTE (ICD-466.0) HYPERTENSION  (ICD-401.9) ARTERIOSCLEROTIC HEART DISEASE (ICD-414.00) VENOUS INSUFFICIENCY, CHRONIC (ICD-459.81) HYPERCHOLESTEROLEMIA (ICD-272.0) HYPOTHYROIDISM (ICD-244.9) GERD (ICD-530.81) DIVERTICULOSIS OF COLON (ICD-562.10) CHOLELITHIASIS (ICD-574.20) URINARY TRACT INFECTION, CHRONIC (ICD-599.0) DEGENERATIVE JOINT DISEASE (ICD-715.90) TIA (ICD-435.9) MEMORY LOSS (ICD-780.93) ANXIETY (ICD-300.00) ANEMIA (ICD-285.9)  Past Surgical History: S/P lap cholecystectomy 6/08 by DrHoxsworth S/P hysterectomy S/P bilat THR's S/P right TKR 4/01 by DrAplington S/P left femur fracture nail 9/03 by DrNorris S/P lumbar fusion  Family History: Reviewed history from 12/24/2007 and no changes required. mother died at age 60 from heart attack father died at age 67 from abscessed liver 4 siblings: 1 brother alive and well at age 75 1 sister alive---hx of cancer age 58 1 sister alive and well age 27 1 sister alive and well age 66  Social History:  Reviewed history from 12/09/2008 and no changes required. pt is retired widowed lives alone has 2 children pt does not smoke or drink  Review of Systems      See HPI       The patient complains of dyspnea on exertion, peripheral edema, muscle weakness, and difficulty walking.  The patient denies anorexia, fever, weight loss, weight gain, vision loss, decreased hearing, hoarseness, chest pain, syncope, prolonged cough, headaches, hemoptysis, abdominal pain, melena, hematochezia, severe indigestion/heartburn, hematuria, incontinence, suspicious skin lesions, transient blindness, depression, unusual weight change, abnormal bleeding, enlarged lymph nodes, and angioedema.    Vital Signs:  Patient profile:   77 year old female Height:      63 inches Weight:      193 pounds BMI:     34.31 O2 Sat:      95 % on Room air Temp:     98.0 degrees F oral Pulse rate:   72 / minute BP sitting:   114 / 72  (left arm) Cuff size:   large  Vitals Entered By: [name] CMA (January 09, 2010 3:10 PM)  O2 Sat at Rest %:  95 O2 Flow:  Room air CC: Office ROV- post ER visit 12/26/09... Is Patient Diabetic? No Pain Assessment Patient in pain? no      Comments no changes in meds today   Physical Exam  Additional Exam:  WD, Overweight, [name] in NAD... GENERAL:  Alert & oriented; pleasant & cooperative... HEENT:  [place]/AT, EOM-wnl, PERRLA, EACs-clear, TMs-wnl, NOSE-clear, THROAT-clear & wnl. NECK:  Supple w/ fairROM; no JVD; normal carotid impulses w/o bruits; no thyromegaly or nodules palpated; no lymphadenopathy. CHEST:  Clear to P & A; without wheezes/ rales/ or rhonchi heard... HEART:  Regular Rhythm; without murmurs/ rubs/ or gallops detected... ABDOMEN:  Soft & nontender; normal bowel sounds; no organomegaly or masses palpated... EXT: mod arthritic changes; +varicose veins/ +venous insuffic/ 1+ edema;  intact pulses in LE's... NEURO:  CN's intact; no focal neuro deficit... no weakness, neg babinsky... DERM:  No lesions noted; no rash etc...    Impression & Recommendations:  Problem # 1:  URINARY TRACT INFECTION, CHRONIC (ICD-599.0) She was evaluated in ER 12/25/09 w/ mult symptoms> neg CXR, Abd film, Labs, etc... she had UTI Rx'd Bacterim & improved...  Problem # 2:  Hx of ASTHMATIC BRONCHITIS, ACUTE (ICD-466.0) She is c/o dry cough, congestion & we reviewed recent labs, CXR, etc... discussed Rx w/ Hydromet syrup Prn + Mucinx DM 2Bid w/ fluids...  Her updated medication list for this problem includes:    Mucinex Dm 30-600 Mg Xr12h-tab (Dextromethorphan-guaifenesin) .Marland Kitchen... Take 2 tabs by mouth two times a day w/ plenty of fluids...    Hydromet 5-1.5 Mg/51ml Syrp (Hydrocodone-homatropine) .Marland Kitchen... Take 1-2 tsp by mouth every 6 h as needed for cough...  Problem # 3:  HYPERTENSION (ICD-401.9) Controlled>  same meds. Her updated medication list for this problem includes:    Toprol Xl 100 Mg Tb24 (Metoprolol succinate) .Marland Kitchen... 1 by mouth once daily     Exforge 5-160 Mg Tabs (Amlodipine besylate-valsartan) .Marland Kitchen... Take 1 tab by mouth once daily...    Furosemide 40 Mg Tabs (Furosemide) .Marland Kitchen... Take 1 tablet by mouth once a day  Problem # 4:  ARTERIOSCLEROTIC HEART DISEASE (ICD-414.00) Stabler on current regimen>  no angina... Her updated medication list for this problem includes:    Adult Aspirin Low Strength 81 Mg Tbdp (Aspirin) .Marland Kitchen... Take 1 tablet by mouth once a day  Plavix 75 Mg Tabs (Clopidogrel bisulfate) .Marland Kitchen... Take 1 tablet by mouth once a day    Toprol Xl 100 Mg Tb24 (Metoprolol succinate) .Marland Kitchen... 1 by mouth once daily    Exforge 5-160 Mg Tabs (Amlodipine besylate-valsartan) .Marland Kitchen... Take 1 tab by mouth once daily...    Isosorbide Mononitrate Cr 30 Mg Tb24 (Isosorbide mononitrate) .Marland Kitchen... 1/2 tab once daily    Furosemide 40 Mg Tabs (Furosemide) .Marland Kitchen... Take 1 tablet by mouth once a day  Problem # 5:  VENOUS INSUFFICIENCY, CHRONIC (ICD-459.81) She has bruised her legs in falls> discussed care, offered more therapy, etc...  Problem # 6:  HYPERCHOLESTEROLEMIA (ICD-272.0) Stable on the Simva40... Her updated medication list for this problem includes:    Simvastatin 40 Mg Tabs (Simvastatin) .Marland Kitchen... Take 1 tablet by mouth once a day  Problem # 7:  HYPOTHYROIDISM (ICD-244.9) Stable on the Levo75... Her updated medication list for this problem includes:    Levothyroxine Sodium 75 Mcg Tabs (Levothyroxine sodium) .Marland Kitchen... Take 1 tablet by mouth once a day  Problem # 8:  ANXIETY (ICD-300.00) Reminded to take the Alpraz regularly at least Bid... Her updated medication list for this problem includes:    Amitriptyline Hcl 25 Mg Tabs (Amitriptyline hcl) .Marland Kitchen... Take 1 tab by mouth at bedtime    Alprazolam 0.5 Mg Tabs (Alprazolam) .Marland Kitchen... Take 1 tablet by mouth two times a day  Complete Medication List: 1)  Adult Aspirin Low Strength 81 Mg Tbdp (Aspirin) .... Take 1 tablet by mouth once a day 2)  Plavix 75 Mg Tabs (Clopidogrel bisulfate) .... Take 1 tablet  by mouth once a day 3)  Toprol Xl 100 Mg Tb24 (Metoprolol succinate) .Marland Kitchen.. 1 by mouth once daily 4)  Exforge 5-160 Mg Tabs (Amlodipine besylate-valsartan) .... Take 1 tab by mouth once daily.Marland KitchenMarland Kitchen 5)  Isosorbide Mononitrate Cr 30 Mg Tb24 (Isosorbide mononitrate) .... 1/2 tab once daily 6)  Furosemide 40 Mg Tabs (Furosemide) .... Take 1 tablet by mouth once a day 7)  Klor-con M20 20 Meq Tbcr (Potassium chloride crys cr) .... Take 1 tablet by mouth once a day 8)  Simvastatin 40 Mg Tabs (Simvastatin) .... Take 1 tablet by mouth once a day 9)  Omega-3 Fish Oil 1000 Mg Caps (Omega-3 fatty acids) .... Take 2 capsules by mouth once daily 10)  Levothyroxine Sodium 75 Mcg Tabs (Levothyroxine sodium) .... Take 1 tablet by mouth once a day 11)  Cosamin Ds 500-400 Mg Tabs (Glucosamine-chondroitin) .... Take 1 tablet by mouth once a day 12)  Centrum Silver Tabs (Multiple vitamins-minerals) .... Take 1 tablet by mouth once a day 13)  Aricept 10 Mg Tabs (Donepezil hcl) .... Take 1 tab by mouth once daily... 14)  Amitriptyline Hcl 25 Mg Tabs (Amitriptyline hcl) .... Take 1 tab by mouth at bedtime 15)  Alprazolam 0.5 Mg Tabs (Alprazolam) .... Take 1 tablet by mouth two times a day 16)  Mucinex Dm 30-600 Mg Xr12h-tab (Dextromethorphan-guaifenesin) .... Take 2 tabs by mouth two times a day w/ plenty of fluids... 17)  Hydromet 5-1.5 Mg/66ml Syrp (Hydrocodone-homatropine) .... Take 1-2 tsp by mouth every 6 h as needed for cough...  Patient Instructions: 1)  Today we updated your med list- see below.... 2)  Please start taking MUCINEX DM 2 tabs by mouth two times a day w/ plenty of fluids.Marland KitchenMarland Kitchen 3)  We also wrote a perscription for hydromet cough syrup to use as needed...  4)  We reviewed your recent XRay, & Labs from the ER visit.Marland KitchenMarland Kitchen 5)  Call for any problems.Marland KitchenMarland Kitchen 6)  Keep your prev sched f/u appt... Prescriptions: HYDROMET 5-1.5 MG/5ML SYRP (HYDROCODONE-HOMATROPINE) take 1-2 tsp by mouth every 6 H as needed for cough...   #8 oz x 2   Entered and Authorized by:   Michele Mcalpine MD   Signed by:   Michele Mcalpine MD on 01/09/2010   Method used:   Print then Give to Patient   RxID:   [id number]

## 2010-09-05 NOTE — Progress Notes (Signed)
Summary: refill  Phone Note Call from Patient Call back at Home Phone [phone] Call back at [phone]   Caller: Patient Call For: nadel Reason for Call: Talk to Nurse Summary of Call: Requesting refill--ALPRAZOLAM 0.5 MG--[facility] [place] Initial call taken by: Lehman Prom,  July 06, 2010 2:49 PM  Follow-up for Phone Call        Spoke with pt.  She states that she is needing a refill on her alprazolam.  I advised looks like we already faxed her a 90 day supply for this with 1 refill the the right source pharm.  She states that she received a call from them stating they faxed [place] something and "needing Dr Kriste Basque to do something" before she could get her med mailed.  She states that she only has a few pills left and wants new rx sent to [facility] [place].  Pls advise thanks Follow-up by: [name],  July 06, 2010 3:00 PM     Appended Document: refill Sorry, accidentally signed.  Will forward to [place].  Appended Document: refill alprazolam to [facility] in [place] Medications Added ALPRAZOLAM 0.5 MG  TABS (ALPRAZOLAM) Take 1/2-1  tablet by mouth three times a day as needed nerves       per SN: ok for alprazolam 0.5mg  #100.  sig: 1/2 - 1 tab by mouth three times a day as needed nerves.  rx called into [facility] [place].  pt is aware. Boone Master CNA/MA  July 06, 2010 5:12 PM   Clinical Lists Changes  Medications: Changed medication from ALPRAZOLAM 0.5 MG  TABS (ALPRAZOLAM) Take 1 tablet by mouth two times a day to ALPRAZOLAM 0.5 MG  TABS (ALPRAZOLAM) Take 1/2-1  tablet by mouth three times a day as needed nerves - Signed Rx of ALPRAZOLAM 0.5 MG  TABS (ALPRAZOLAM) Take 1/2-1  tablet by mouth three times a day as needed nerves;  #100 x 0;  Signed;  Entered by: Boone Master CNA/MA;  Authorized by: Michele Mcalpine MD;  Method used: Telephoned to [facility] [address], [place], [place], [place]  [address], Ph: [phone], Fax:  [phone]    Prescriptions: ALPRAZOLAM 0.5 MG  TABS (ALPRAZOLAM) Take 1/2-1  tablet by mouth three times a day as needed nerves  #100 x 0   Entered by:   Boone Master CNA/MA   Authorized by:   Michele Mcalpine MD   Signed by:   Boone Master CNA/MA on 07/06/2010   Method used:   Telephoned to ...       [facility] [address] (retail)       [address]       [place], [place]  [address]       Ph: [phone]       Fax: [phone]   RxID:   [id number]

## 2010-09-05 NOTE — Assessment & Plan Note (Signed)
Summary: 4-6 month return/mhh   Primary Care Provider:  Dr Kriste Basque  CC:  5 month ROV 7 review of mult medical problems....  History of Present Illness: [name] here for a follow up visit... she has multiple medical problems as noted below...    ~  Jan10:  saw DrRoss for f/u CAD, HBP, Dyslipidemia... BP was sl elevated- no changes made.  ~  Feb10:  states BP has been up at home- getting 160-170/ 80-90's much of the time... she notes sl lightheaded, sl SOB, otherw neg ROS... BP here is 140-160/ 80-92 on several checks today... ** we decided to add EXFORGE 5-160 (samples).  ~  Mar10:  BPs much improved w/ the Exforge 5-160 added... 122/70 today, and 120-130/ 70-80 at home... tolerating well & feeling better.  ~  Apr10:  pt fell in her yard 2d ago & laid there for 2H before help arrived- no apparent injury... she's had bilat THRs and right TKR in past- ** we discussed PhysTerapy for her...   She awoke today w/ numbness in her face bilaterally and in both arms- mostly hands... denies weakness anywhere, speech prob, etc... she notes hands stay numb most of the time & we discussed CTS & need for wrist splints... she had similar numbness & paresthesias in the past w/ neuro eval DrWeymann in 1996 w/ neg MRI (?TIA)... also seen 4/09 w/ similar symptoms & labs all normal- she was reassured and symptoms improved... ** exam neg & MRI was rechecked>>> sm vessel infarct & mild intracranial atherosclerotic dis + very small left paraophthalmic artery ICA aneurysm measuring 2x 3 x 3 mm (53yr f/u MRA rec)... plavix added.  ~  Jul10:  she is c/o incr edema- has VI weight up 5# on low sodium diet & Lasix 40mg /d... also notes sweating, bruising, no appetite, no energy, etc...   ~  Nov10:  12mo f/u doing satis- wt up 2# to 198#, she has 1-2+ edema but NOT restricting salt... BP controlled, prev Chol looked great on meds, stable neurologically... OK flu shot today.   ~  November 11, 2009:  she states "I feel pretty good" w/o  new complaints or concerns today x bruising w/ thin skin & her ASA/ Plavix... BP controlled on meds;  no angina w/ activities;  Chol controlled on the simva40;  & stable on her other meds as listed... f/u FASTING blood work today.   Current Problem List  ASTHMATIC BRONCHITIS, ACUTE (ICD-466.0) - she is a never smoker...  breathing has been good... no problems.  HYPERTENSION (ICD-401.9) - controlled on TOPROL XL 100/d,  EXFORGE 5-160 daily,  LASIX 40mg /d, KCl 84mEq/d, & IMDUR 30mg - 1/2tab/d... BP today= 130/84 today & even better at home- notes reduced symptoms of lightheadedness & denies HA, visual changes, CP, palipit, syncope, edema, etc...  ~  labs 4/11 showed normal BMet...  ARTERIOSCLEROTIC HEART DISEASE (ICD-414.00) - on IMDUR 30mg - 1/2 tab daily, plus ASA 81mg /d & PLAVIX 75mg /d...  ~  cath 1998 & 2003 w/ LAD lesion ~50%, no other abn seen...  ~  neg Myoview 11/08 w/o ischemia and EF=83%.  ~  saw DrRoss on 2/09 w/ review of 11/08 hosp for atypical CP...  doing satis without recurrent CP but too sedentary and difficulty getting around w/ her arthritis...  ~  last saw DrRoss Jan10- stable, same meds.  HYPERCHOLESTEROLEMIA (ICD-272.0) - on SIMVASTATIN 40mg /d & FISH OIL...  ~  FLP 9/08 showed TChol 144, TG 204, HDL 34, LDL 72  ~  FLP 3/09 showed TChol 143, TG 178, HDL 33, LDL 74... rec- contin meds, incr exercise.  ~  FLP 1/10 showed TChol 150, TG 162, HDL 39, LDL 79... DrRoss rec same meds, better diet.  ~  FLP 7/10 showed TChol 143, TG 176, HDL 37, LDL 71  ~  FLP 4/11 showed TChol 154, TG 154, HDL 44, LDL 79  HYPOTHYROIDISM (ICD-244.9) - on LEVOTHYROID 59mcg/d...  ~  labs 3/09 showed TSH= 0.98  ~  labs 7/10 showed TSH= 1.37  ~  labs 4/11 showed TSH= 1.39  GERD (ICD-530.81) - EGD by [facility] 6/06 showed HH, mild gastritis...  ~  last saw [facility] Aug09 w/ Carafate added (off now).  DIVERTICULOSIS OF COLON (ICD-562.10) - followed by [facility] and last colonoscopy was 4/08 showing  divertics, 2 sm polyps (tubular adenomas), & hems...  CHOLELITHIASIS (ICD-574.20) - seen on prev CTAbd 4/08... s/p lap chole 6/08 by DrHoxworth...  URINARY TRACT INFECTION, CHRONIC (ICD-599.0) - mult cultures in 2009 showed sens EColi & Rx w/ Cipro... we discussed the need for Urology eval if UTI's continue to recur (for further eval).  DEGENERATIVE JOINT DISEASE (ICD-715.90) - she has severe DJD w/ prev bilat THR, right TKR, and left femur fx w/ pinning 2003... c/o difficulty w/ ambulation, exercise, etc... she takes Glucoamine Bid + MVI etc...  ~  labs 7/10 showed Vit D level = 31... rec> start Vit D 1000 u daily.  TIA (ICD-435.9) - she had left side numbness in 1996 and saw DrWeymann... MRI was neg... Rx w/ ASA... seen by TParrett,NP 4/09 w/ numbness & paresthesias- norm sed, B12, folate... improved w/ reassurance.  ~  Apr10: add-on for numbness & recent fall-  MRI showed sm vessel infarct & mild intracranial atherosclerotic dis + very small left paraophthalmic artery ICA aneurysm measuring 2x 3 x 3 mm (29yr f/u MRA rec)... PLAVIX 75mg /d added.  MEMORY LOSS (ICD-780.93) - started on ARICEPT 2/09 and sl better over time... she does her own meds w/ daughter checking on her...  ANXIETY (ICD-300.00) - on ALPRAZOLAM 0.5mg  Bid,  & AMITRIPTYLINE 25mg Qhs...  Hx of ANEMIA (ICD-285.9) - Hx of anemia in the past... Hg has remained WNL since then...  ~  labs 7/10 showed Hg= 14.0  ~  labs 4/11 showed Hg= 14.7   Allergies: 1)  ! Floxin 2)  ! Morphine  Comments:  Nurse/Medical Assistant: The patient's medications and allergies were reviewed with the patient and were updated in the Medication and Allergy Lists.  Past History:  Past Medical History:  ALLERGIC RHINITIS (ICD-477.9) ARTERIOSCLEROTIC HEART DISEASE (ICD-414.00) HYPERTENSION (ICD-401.9) Hx of ASTHMATIC BRONCHITIS, ACUTE (ICD-466.0) HYPERCHOLESTEROLEMIA (ICD-272.0) HYPOTHYROIDISM (ICD-244.9) GERD (ICD-530.81) DIVERTICULOSIS OF  COLON (ICD-562.10) CHOLELITHIASIS (ICD-574.20) URINARY TRACT INFECTION, CHRONIC (ICD-599.0) DEGENERATIVE JOINT DISEASE (ICD-715.90) TIA (ICD-435.9) MEMORY LOSS (ICD-780.93) ANXIETY (ICD-300.00) ANEMIA (ICD-285.9)  Past Surgical History: S/P lap cholecystectomy 6/08 by DrHoxsworth S/P hysterectomy S/P bilat THR's S/P right TKR 4/01 by DrAplington S/P left femur fracture nail 9/03 by DrNorris S/P lumbar fusion  Family History: Reviewed history from 12/24/2007 and no changes required. mother died at age 82 from heart attack father died at age 33 from abscessed liver 4 siblings: 1 brother alive and well at age 22 1 sister alive---hx of cancer age 3 1 sister alive and well age 26 1 sister alive and well age 3  Social History: Reviewed history from 02-13-202010 and no changes required. pt is retired widowed lives alone has 2 children pt does not smoke or drink  Review of  Systems      See HPI       The patient complains of dyspnea on exertion, peripheral edema, and difficulty walking.  The patient denies anorexia, fever, weight loss, weight gain, vision loss, decreased hearing, hoarseness, chest pain, syncope, prolonged cough, headaches, hemoptysis, abdominal pain, melena, hematochezia, severe indigestion/heartburn, hematuria, incontinence, muscle weakness, suspicious skin lesions, transient blindness, depression, unusual weight change, abnormal bleeding, enlarged lymph nodes, and angioedema.    Vital Signs:  Patient profile:   77 year old female Height:      63 inches Weight:      195.38 pounds BMI:     34.74 O2 Sat:      96 % on Room air Temp:     97.6 degrees F oral Pulse rate:   62 / minute BP sitting:   138 / 80  (left arm) Cuff size:   regular  Vitals Entered By: [name] CMA (November 11, 2009 10:13 AM)  O2 Sat at Rest %:  96 O2 Flow:  Room air CC: 5 month ROV 7 review of mult medical problems... Is Patient Diabetic? No Pain Assessment Patient in pain? no        Comments no changes in meds today   Physical Exam  Additional Exam:  WD, Overweight, [name] in NAD... GENERAL:  Alert & oriented; pleasant & cooperative... HEENT:  [place]/AT, EOM-wnl, PERRLA, EACs-clear, TMs-wnl, NOSE-clear, THROAT-clear & wnl. NECK:  Supple w/ fairROM; no JVD; normal carotid impulses w/o bruits; no thyromegaly or nodules palpated; no lymphadenopathy. CHEST:  Clear to P & A; without wheezes/ rales/ or rhonchi heard... HEART:  Regular Rhythm; without murmurs/ rubs/ or gallops detected... ABDOMEN:  Soft & nontender; normal bowel sounds; no organomegaly or masses palpated... EXT: mod arthritic changes; +varicose veins/ +venous insuffic/ 1+ edema;  intact pulses in LE's... NEURO:  CN's intact; no focal neuro deficit... no weakness, neg babinsky... DERM:  No lesions noted; no rash etc...    MISC. Report  Procedure date:  11/11/2009  Findings:      Lipid Panel (LIPID)   Cholesterol               154 mg/dL                   1-610   Triglycerides        [H]  154.0 mg/dL                 9.6-045.4   HDL                       09.81 mg/dL                 >19.14   LDL Cholesterol           79 mg/dL                    7-82  BMP (METABOL)   Sodium                    144 mEq/L                   135-145   Potassium                 4.2 mEq/L                   3.5-5.1   Chloride  107 mEq/L                   96-112   Carbon Dioxide            30 mEq/L                    19-32   Glucose              [H]  118 mg/dL                   16-10   BUN                       18 mg/dL                    9-60   Creatinine                1.0 mg/dL                   4.5-4.0   Calcium                   9.8 mg/dL                   9.8-11.9   GFR                       57.37 mL/min                >60   Hepatic/Liver Function Panel (HEPATIC)   Total Bilirubin           0.5 mg/dL                   1.4-7.8   Direct Bilirubin          0.2 mg/dL                   2.9-5.6    Alkaline Phosphatase      73 U/L                      39-117   AST                       28 U/L                      0-37   ALT                       19 U/L                      0-35   Total Protein             7.1 g/dL                    2.1-3.0   Albumin                   4.0 g/dL                    8.6-5.7  Comments:      CBC Platelet w/Diff (CBCD)   White Cell Count          5.3 K/uL  4.5-10.5   Red Cell Count            4.39 Mil/uL                 3.87-5.11   Hemoglobin                14.7 g/dL                   16.1-09.6   Hematocrit                42.6 %                      36.0-46.0   MCV                       97.0 fl                     78.0-100.0   Platelet Count            200.0 K/uL                  150.0-400.0   Neutrophil %              61.5 %                      43.0-77.0   Lymphocyte %              27.6 %                      12.0-46.0   Monocyte %                7.7 %                       3.0-12.0   Eosinophils%              2.8 %                       0.0-5.0   Basophils %               0.4 %                       0.0-3.0   TSH (TSH)   FastTSH                   1.39 uIU/mL                 0.35-5.50   Impression & Recommendations:  Problem # 1:  HYPERTENSION (ICD-401.9) Controlled-  continue same meds... Her updated medication list for this problem includes:    Toprol Xl 100 Mg Tb24 (Metoprolol succinate) .Marland Kitchen... 1 by mouth once daily    Exforge 5-160 Mg Tabs (Amlodipine besylate-valsartan) .Marland Kitchen... Take 1 tab by mouth once daily...    Furosemide 40 Mg Tabs (Furosemide) .Marland Kitchen... Take 1 tablet by mouth once a day  Orders: TLB-Lipid Panel (80061-LIPID) TLB-BMP (Basic Metabolic Panel-BMET) (80048-METABOL) TLB-Hepatic/Liver Function Pnl (80076-HEPATIC) TLB-CBC Platelet - w/Differential (85025-CBCD) TLB-TSH (Thyroid Stimulating Hormone) (84443-TSH)  Problem # 2:  ARTERIOSCLEROTIC HEART DISEASE (ICD-414.00) Stable-  no angina, pt asked to incr  activity/ exercise. Her updated medication list for this problem includes:    Adult Aspirin Low Strength 81 Mg Tbdp (Aspirin) .Marland Kitchen... Take 1 tablet by mouth once a  day    Plavix 75 Mg Tabs (Clopidogrel bisulfate) .Marland Kitchen... Take 1 tablet by mouth once a day    Toprol Xl 100 Mg Tb24 (Metoprolol succinate) .Marland Kitchen... 1 by mouth once daily    Exforge 5-160 Mg Tabs (Amlodipine besylate-valsartan) .Marland Kitchen... Take 1 tab by mouth once daily...    Isosorbide Mononitrate Cr 30 Mg Tb24 (Isosorbide mononitrate) .Marland Kitchen... 1/2 tab once daily    Furosemide 40 Mg Tabs (Furosemide) .Marland Kitchen... Take 1 tablet by mouth once a day  Problem # 3:  HYPERCHOLESTEROLEMIA (ICD-272.0) Stable on Simva40-  continue same + diet & incr exerc... Her updated medication list for this problem includes:    Simvastatin 40 Mg Tabs (Simvastatin) .Marland Kitchen... Take 1 tablet by mouth once a day  Problem # 4:  HYPOTHYROIDISM (ICD-244.9) Stable on Levothy 75, continue same. Her updated medication list for this problem includes:    Levothyroxine Sodium 75 Mcg Tabs (Levothyroxine sodium) .Marland Kitchen... Take 1 tablet by mouth once a day  Problem # 5:  GERD (ICD-530.81) GI is stable-  same Rx.  Problem # 6:  DEGENERATIVE JOINT DISEASE (ICD-715.90) She is holding her own but needs incr exercise... Her updated medication list for this problem includes:    Adult Aspirin Low Strength 81 Mg Tbdp (Aspirin) .Marland Kitchen... Take 1 tablet by mouth once a day  Problem # 7:  TIA (ICD-435.9) Continue ASA, Plavix... Her updated medication list for this problem includes:    Adult Aspirin Low Strength 81 Mg Tbdp (Aspirin) .Marland Kitchen... Take 1 tablet by mouth once a day    Plavix 75 Mg Tabs (Clopidogrel bisulfate) .Marland Kitchen... Take 1 tablet by mouth once a day  Problem # 8:  MEMORY LOSS (ICD-780.93) Continue Aricept...  Complete Medication List: 1)  Adult Aspirin Low Strength 81 Mg Tbdp (Aspirin) .... Take 1 tablet by mouth once a day 2)  Plavix 75 Mg Tabs (Clopidogrel bisulfate) .... Take 1 tablet by mouth  once a day 3)  Toprol Xl 100 Mg Tb24 (Metoprolol succinate) .Marland Kitchen.. 1 by mouth once daily 4)  Exforge 5-160 Mg Tabs (Amlodipine besylate-valsartan) .... Take 1 tab by mouth once daily.Marland KitchenMarland Kitchen 5)  Isosorbide Mononitrate Cr 30 Mg Tb24 (Isosorbide mononitrate) .... 1/2 tab once daily 6)  Furosemide 40 Mg Tabs (Furosemide) .... Take 1 tablet by mouth once a day 7)  Klor-con M20 20 Meq Tbcr (Potassium chloride crys cr) .... Take 1 tablet by mouth once a day 8)  Simvastatin 40 Mg Tabs (Simvastatin) .... Take 1 tablet by mouth once a day 9)  Omega-3 Fish Oil 1000 Mg Caps (Omega-3 fatty acids) .... Take 2 capsules by mouth once daily 10)  Levothyroxine Sodium 75 Mcg Tabs (Levothyroxine sodium) .... Take 1 tablet by mouth once a day 11)  Cosamin Ds 500-400 Mg Tabs (Glucosamine-chondroitin) .... Take 1 tablet by mouth once a day 12)  Aricept 10 Mg Tabs (Donepezil hcl) .... Take 1 tab by mouth once daily... 13)  Amitriptyline Hcl 25 Mg Tabs (Amitriptyline hcl) .... Take 1 tab by mouth at bedtime 14)  Alprazolam 0.5 Mg Tabs (Alprazolam) .... Take 1 tablet by mouth two times a day 15)  Centrum Silver Tabs (Multiple vitamins-minerals) .... Take 1 tablet by mouth once a day  Patient Instructions: 1)  Today we updated your med list- see below.... 2)  Continue your current medications the same... 3)  Remember to stay as active as possible... NO SALT, & wear support hose if poss... 4)  Today we did your follow up FASTING blood  work... please call the "phone tree" in a few days for your lab results.Marland KitchenMarland Kitchen 5)  Call for any problems.Marland KitchenMarland Kitchen 6)  Please schedule a follow-up appointment in 6 months.

## 2010-09-05 NOTE — Miscellaneous (Signed)
Summary: Orders Update  Clinical Lists Changes  Orders: Added new Test order of Carotid Duplex (Carotid Duplex) - Signed  Appended Document: PMH update-nml carotid doppler except for ?thyroid mass.       Past History:  Past Medical History: ASTHMATIC BRONCHITIS, ACUTE (ICD-466.0) - she is a never smoker...  breathing has been good... but c/o dry cough, min yellow sputum, she wants cough syrup- OK Hydromet Prn + Mucinex DM 2Bid w/ fluids...  HYPERTENSION (ICD-401.9) - controlled on TOPROL XL 100/d,  EXFORGE 5-160 daily,  LASIX 40mg /d, KCl 65mEq/d, & IMDUR 30mg - 1/2tab/d...   ARTERIOSCLEROTIC HEART DISEASE (ICD-414.00) - ..  ~  cath 1998 & 2003 w/ LAD lesion ~50%, no other abn seen...  ~  neg Myoview 11/08 w/o ischemia and EF=83%.   VENOUS INSUFFICIENCY, CHRONIC (ICD-459.81) - she has chronic venous insuffic changes in her LE's w/ edema, bruising, etc..  HYPERCHOLESTEROLEMIA (ICD-272.0) - on SIMVASTATIN 40mg /d & FISH OIL...   ~  FLP 1/10 showed TChol 150, TG 162, HDL 39, LDL 79... DrRoss rec same meds, better diet.  ~  FLP 7/10 showed TChol 143, TG 176, HDL 37, LDL 71  ~  FLP 4/11 showed TChol 154, TG 154, HDL 44, LDL 79  HYPOTHYROIDISM (ICD-244.9) - on LEVOTHYROID 51mcg/d...   ~  labs 4/11 showed TSH= 1.39 03/11/10 ?Left lobe mass found on carotid doppler>>>pt wants to hold on [place] and discuss w/ Dr Kriste Basque on return    GERD (ICD-530.81) - EGD by [facility] 6/06 showed HH, mild gastritis...  ~  last saw [facility] Aug09 w/ Carafate added (off now).  TIA (ICD-435.9) - she had left side numbness in 1996 and saw DrWeymann... MRI was neg... Rx w/ ASA.   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 PLAVIX 75mg /d added.  ~7/11 MRI/MRA w/ no changes, neg carotid doppler.

## 2010-09-05 NOTE — Progress Notes (Signed)
Summary: memory loss > appt 11.7.11 w/ SN  Phone Note Call from Patient   Caller: Daughter--REGINA--[phone] Call For: nadel Reason for Call: Talk to Nurse Summary of Call: Daughter calling about mother.  She says she has noticed that her mother has been showing signs of memory loss.  It has really gotten bad the last couple of weeks. She is  has been acting confused, has trouble dailing and remember phone numbers, and this has never been an issue before.  Requesting appt. w/Nadel Initial call taken by: Lehman Prom,  June 08, 2010 1:35 PM  Follow-up for Phone Call        called spoke with Rene Kocher who states that pt has been having showing signs of memory loss and acting confused x2-3days.  states pt still does well with remember family members, etc but has trouble remember phone numbers and how to use the phone.  req appt with SN; declined appt with TP.  appt scheduled with SN 11.7.11 @ 1100.  Rene Kocher ok with this date and time. Follow-up by: Boone Master CNA/MA,  June 08, 2010 2:02 PM

## 2010-09-05 NOTE — Consult Note (Signed)
Summary: [facility]  [facility]   Imported By: Lennie Odor 06/01/2010 11:37:03  _____________________________________________________________________  External Attachment:    Type:   Image     Comment:   External Document

## 2010-09-05 NOTE — Progress Notes (Signed)
Summary: numbness> needs ov  Phone Note Call from Patient Call back at [phone]   Caller: Hulen Shouts Call For: Kriste Basque Reason for Call: Talk to Nurse Summary of Call: pt experiencing numbness in face and hands again.  Pt went to ED([facility]) over w/e.  They couldn't find anything.  Pt's caregiver would like a call back Initial call taken by: Eugene Gavia,  February 27, 2010 8:14 AM  Follow-up for Phone Call        pt went to ED 7.24.11 for symmetrical facial and hand numbness.  CT was normal, CBCD normal, BMET normal.  udip showed trace leukocytes, small blood, protein in urine, rare bacteria and epithelials.  no meds given in ED.  called spoke with Rene Kocher, pt's daughter/caregiver.  she states that pt's symptoms began yesterday morning and worsened throughout the day.  numbness was symmetrical but centered around the lips and both hands, with bilateral weakness.  denies any slurred speech, HA, blurred vision.  offered ED follow up appt w/ TP tomorrow, but appt was declined.  Rene Kocher would like to know what they need to do next for symptoms - she states that this same thing happened in May.  pt had UTI was treated and symptoms later improved with the same symtpoms/treatment repeating themselves approx 1 month later.  will forward to MW since SN and TP are out of the office.  Follow-up by: Boone Master CNA/MA,  February 27, 2010 9:30 AM  Additional Follow-up for Phone Call Additional follow up Details #1::        ov with Tammy 7/26 Additional Follow-up by: Nyoka Cowden MD,  February 27, 2010 10:17 AM    Additional Follow-up for Phone Call Additional follow up Details #2::    called and spoke with regina and she is aware of appt tomorrow with TP at 2:45. [name] CMA  February 27, 2010 10:24 AM

## 2010-09-05 NOTE — Op Note (Signed)
Summary: [facility]  [facility]   Imported By: Sherian Rein 06/16/2010 15:15:09  _____________________________________________________________________  External Attachment:    Type:   Image     Comment:   External Document

## 2010-09-05 NOTE — Progress Notes (Signed)
Summary: biopsy  Phone Note From Other Clinic Call back at [phone]   Caller: Patient Caller: [facility] ( tammy Call For: nadel Summary of Call: pt need to stop plavix for 7 days to have thyroid biopsy Initial call taken by: Rickard Patience,  March 20, 2010 12:03 PM  Follow-up for Phone Call        Pt scheduled for Thyroid Biopsy on 03-29-10 and she needs to be off of Plavix for 7 days. Tammy at [facility] just needs SN approval for pt to be off plavix for 7 days. She states it is ok fo rpt topcont aspirin. Please advsie if ok to stop plavix x 7 days. Thanks.Carron Curie CMA  March 20, 2010 12:18 PM   Additional Follow-up for Phone Call Additional follow up Details #1::        per SN---ok to stop the plavix for this biopsy.  thanks [name] CMA  March 20, 2010 2:36 PM    spoke with Tammy at [facility] and she understands SN approved for pt to stop Plavix.Reynaldo Minium CMA  March 20, 2010 2:43 PM

## 2010-09-05 NOTE — Assessment & Plan Note (Signed)
Summary: NP follow up - ER follow up   Primary Provider/Referring Provider:  Dr Kriste Basque  CC:  post ER follow up for facial and hand numbness - states is better today.  when symptoms present before was positive for UTI.  Marland Kitchen  History of Present Illness: 77  y/o WF with known history of  HTN, hyperlipidemia, GERD, and Dementia   ~  Nov10:  23mo f/u doing satis- wt up 2# to 198#, she has 1-2+ edema but NOT restricting salt... BP controlled, prev Chol looked great on meds, stable neurologically... OK flu shot today.  February 28, 2010--Presents for an ER follow up. Pt had another episode of  facial and hand numbness - states is better today.  when symptoms present before was positive for UTI. She has had several of these episodes in the past w/ neuro work up and CT/MRI scans.   1996 w/ neg MRI (?TIA)... also seen 4/09 w/ similar symptoms & labs all normal- she was reassured and symptoms improved... 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 Plavix added. she describes these episodes as sporadic, sudden onset of weakness , no energy "feels like a dish rag". no syncope, no chest pain, dyspnea, swelling, vomitting or nausea. She is due for MRIAfollow up of aneurysm. CT scan in ER was neg for acute findings.        Allergies: 1)  ! Floxin 2)  ! Morphine  Past History:  Past Medical History: Last updated: 01/09/2010 ALLERGIC RHINITIS (ICD-477.9) Hx of ASTHMATIC BRONCHITIS, ACUTE (ICD-466.0) HYPERTENSION (ICD-401.9) ARTERIOSCLEROTIC HEART DISEASE (ICD-414.00) VENOUS INSUFFICIENCY, CHRONIC (ICD-459.81) HYPERCHOLESTEROLEMIA (ICD-272.0) HYPOTHYROIDISM (ICD-244.9) GERD (ICD-530.81) DIVERTICULOSIS OF COLON (ICD-562.10) CHOLELITHIASIS (ICD-574.20) URINARY TRACT INFECTION, CHRONIC (ICD-599.0) DEGENERATIVE JOINT DISEASE (ICD-715.90) TIA (ICD-435.9) MEMORY LOSS (ICD-780.93) ANXIETY  (ICD-300.00) ANEMIA (ICD-285.9)  Past Surgical History: Last updated: 01/09/2010 S/P lap cholecystectomy 6/08 by DrHoxsworth S/P hysterectomy S/P bilat THR's S/P right TKR 4/01 by DrAplington S/P left femur fracture nail 9/03 by DrNorris S/P lumbar fusion  Family History: Last updated: 2008-01-10 mother died at age 47 from heart attack father died at age 22 from abscessed liver 4 siblings: 1 brother alive and well at age 13 1 sister alive---hx of cancer age 33 1 sister alive and well age 44 1 sister alive and well age 43  Social History: Last updated: 08/13/2008 pt is retired widowed lives alone has 2 children pt does not smoke or drink  Risk Factors: Exercise: no (09/03/2007)  Risk Factors: Smoking Status: never (01/09/2010)  Review of Systems      See HPI  Vital Signs:  Patient profile:   77 year old female Height:      63 inches Weight:      192.38 pounds BMI:     34.20 O2 Sat:      95 % on Room air Temp:     98.4 degrees F oral Pulse rate:   73 / minute BP sitting:   140 / 78  (left arm) Cuff size:   regular  Vitals Entered By: Boone Master CNA/MA (February 28, 2010 2:59 PM)  O2 Flow:  Room air CC: post ER follow up for facial and hand numbness - states is better today.  when symptoms present before was positive for UTI.   Is Patient Diabetic? No Comments Medications reviewed with patient Daytime contact number verified with patient. Boone Master CNA/MA  February 28, 2010 3:01 PM  Physical Exam  Additional Exam:  WD, Overweight, [name] in NAD... GENERAL:  Alert & oriented; pleasant & cooperative... HEENT:  [place]/AT, EOM-wnl, PERRLA, EACs-clear, TMs-wnl, NOSE-clear, THROAT-clear & wnl. NECK:  Supple w/ fairROM; no JVD; normal carotid impulses w/o bruits; no thyromegaly or nodules palpated; no lymphadenopathy. CHEST:  Clear to P & A; without wheezes/ rales/ or rhonchi heard... HEART:  Regular Rhythm; without murmurs/ rubs/ or gallops  detected... ABDOMEN:  Soft & nontender; normal bowel sounds; no organomegaly or masses palpated... EXT: mod arthritic changes; +varicose veins/ +venous insuffic/ 1+ edema;  intact pulses in LE's... NEURO:  CN's intact; no focal neuro deficit... equal strength of ext. nml grips, neg tremor.  DERM:  No lesions noted; no rash etc...    Impression & Recommendations:  Problem # 1:  NUMBNESS (ICD-782.0)  Recurrent episodes of facial numbness, paraesthesia, weakness , questionable etiology.  Possible TIA episodes. CT was neg for acute finding. Previous MRI positive for old stroke. She is on plavix.  MRA for aneurysm surveillance is due. will set up for MRA. , also check carotid dopplers.  Labs are pending will follow up accordingly  Orders: Est. Patient Level IV (72536)  Complete Medication List: 1)  Adult Aspirin Low Strength 81 Mg Tbdp (Aspirin) .... Take 1 tablet by mouth once a day 2)  Plavix 75 Mg Tabs (Clopidogrel bisulfate) .... Take 1 tablet by mouth once a day 3)  Toprol Xl 100 Mg Tb24 (Metoprolol succinate) .Marland Kitchen.. 1 by mouth once daily 4)  Exforge 5-160 Mg Tabs (Amlodipine besylate-valsartan) .... Take 1 tab by mouth once daily.Marland KitchenMarland Kitchen 5)  Isosorbide Mononitrate Cr 30 Mg Tb24 (Isosorbide mononitrate) .... 1/2 tab once daily 6)  Furosemide 40 Mg Tabs (Furosemide) .... Take 1 tablet by mouth once a day 7)  Klor-con M20 20 Meq Tbcr (Potassium chloride crys cr) .... Take 1 tablet by mouth once a day 8)  Simvastatin 40 Mg Tabs (Simvastatin) .... Take 1 tablet by mouth once a day 9)  Omega-3 Fish Oil 1000 Mg Caps (Omega-3 fatty acids) .... Take 2 capsules by mouth once daily 10)  Levothyroxine Sodium 75 Mcg Tabs (Levothyroxine sodium) .... Take 1 tablet by mouth once a day 11)  Cosamin Ds 500-400 Mg Tabs (Glucosamine-chondroitin) .... Take 1 tablet by mouth once a day 12)  Centrum Silver Tabs (Multiple vitamins-minerals) .... Take 1 tablet by mouth once a day 13)  Aricept 10 Mg Tabs (Donepezil  hcl) .... Take 1 tab by mouth once daily... 14)  Amitriptyline Hcl 25 Mg Tabs (Amitriptyline hcl) .... Take 1 tab by mouth at bedtime 15)  Alprazolam 0.5 Mg Tabs (Alprazolam) .... Take 1 tablet by mouth two times a day 16)  Mucinex Dm 30-600 Mg Xr12h-tab (Dextromethorphan-guaifenesin) .... Take 2 tabs by mouth two times a day w/ plenty of fluids... 17)  Hydromet 5-1.5 Mg/63ml Syrp (Hydrocodone-homatropine) .... Take 1-2 tsp by mouth every 6 h as needed for cough...  Other Orders: T-Urine Culture (Spectrum Order) [phone]) Radiology Referral (Radiology) Doppler Referral (Doppler) TLB-Udip w/ Micro (81001-URINE)  Patient Instructions: 1)  We are setting you up for doppler of your carotid vessels.  2)  We are setting you up for a MRA of your brain.  3)  I will call with lab and test results.  4)  follow up Dr. Kriste Basque as scheduled in 2 months  5)  Please contact office for sooner follow up if symptoms do not improve or worsen

## 2010-09-05 NOTE — Medication Information (Signed)
Summary: Clarification/[facility]  [facility]   Imported By: Lester [place] 03/09/2010 08:32:39  _____________________________________________________________________  External Attachment:    Type:   Image     Comment:   External Document

## 2010-09-05 NOTE — Progress Notes (Signed)
Summary: refills  Phone Note Call from Patient   Caller: Andrea Mccoy Call For: nadel Reason for Call: Talk to Nurse Summary of Call: Patient daughter calling needing all med refilled. Initial call taken by: Lehman Prom,  May 31, 2010 12:14 PM  Follow-up for Phone Call        called spoke with pt's daughter who could not verifiy what meds pt needs refilled.  Andrea Mccoy unable to verify.  called spoke with patient who states that she needs refills on her amitriptyline, xanax, klor con, lasix, exforge and toprol to [facility].  rx's printed off for SN to sign.  pt last seen 10.6.11. Follow-up by: Boone Master CNA/MA,  May 31, 2010 12:50 PM  Additional Follow-up for Phone Call Additional follow up Details #1::        rx have been printed out and signed and faxed to [facility] per pts request [name] [facility]  May 31, 2010 4:55 PM     Prescriptions: ALPRAZOLAM 0.5 MG  TABS (ALPRAZOLAM) Take 1 tablet by mouth two times a day  #180 x 1   Entered by:   Boone Master CNA/MA   Authorized by:   Michele Mcalpine MD   Signed by:   Boone Master CNA/MA on 05/31/2010   Method used:   Printed then faxed to ...       [facility] [address]* (retail)       [address]       [place], [place]  [address]       Ph: [phone]       Fax: [phone]   RxID:   [id number] AMITRIPTYLINE HCL 25 MG  TABS (AMITRIPTYLINE HCL) Take 1 tab by mouth at bedtime  #90 x 3   Entered by:   Boone Master CNA/MA   Authorized by:   Michele Mcalpine MD   Signed by:   Boone Master CNA/MA on 05/31/2010   Method used:   Printed then faxed to ...       [facility] [address]* (retail)       [address]       [place], [place]  [address]       Ph: [phone]       Fax: [phone]   RxID:   [id number] KLOR-CON M20 20 MEQ  TBCR (POTASSIUM CHLORIDE CRYS CR) Take 1 tablet by mouth once a day  #90 x 3   Entered by:   Boone Master CNA/MA   Authorized by:   Michele Mcalpine MD   Signed by:   Boone Master CNA/MA on 05/31/2010   Method used:   Printed then faxed to ...       [facility] [address]* (retail)       [address]       [place], [place]  [address]       Ph: [phone]       Fax: [phone]   RxID:   [id number] FUROSEMIDE 40 MG  TABS (FUROSEMIDE) Take 1 tablet by mouth once a day  #90 x 3   Entered by:   Boone Master CNA/MA   Authorized by:   Michele Mcalpine MD   Signed by:   Boone Master CNA/MA on 05/31/2010   Method used:   Printed then faxed to .Marland KitchenMarland Kitchen  [facility] [address] (retail)       [address]       [place], [place]  [address]       Ph: [phone]       Fax: [phone]   RxID:   [id number] EXFORGE 5-160 MG TABS (AMLODIPINE BESYLATE-VALSARTAN) take 1 tab by mouth once daily...  #90 x 3   Entered by:   Boone Master CNA/MA   Authorized by:   Michele Mcalpine MD   Signed by:   Boone Master CNA/MA on 05/31/2010   Method used:   Printed then faxed to ...       [facility] [address] (retail)       [address]       [place], [place]  [address]       Ph: [phone]       Fax: [phone]   RxID:   [id number] TOPROL XL 100 MG  TB24 (METOPROLOL SUCCINATE) 1 by mouth once daily  #90 x 3   Entered by:   Boone Master CNA/MA   Authorized by:   Michele Mcalpine MD   Signed by:   Boone Master CNA/MA on 05/31/2010   Method used:   Printed then faxed to ...       [facility] [address] (retail)       [address]       [place], [place]  [address]       Ph: [phone]       Fax: [phone]   RxID:   [id number]

## 2010-09-05 NOTE — Medication Information (Signed)
Summary: [facility]  [facility]   Imported By: Lester [place] 10/25/2009 09:40:53  _____________________________________________________________________  External Attachment:    Type:   Image     Comment:   External Document

## 2010-09-05 NOTE — Assessment & Plan Note (Signed)
Summary: memory loss x2-3weeks///Andrea Mccoy   Primary Care Provider:  Dr Kriste Basque  CC:  1 month ROV & add-on for memory prob....  History of Present Illness: [name] here for a follow up visit... she has multiple medical problems as noted below...    ~  January 09, 2010:  Post ER f/u> seen in ER 12/25/09 w/ mult symptoms- CC= "weakness", n/v, constip, shaking, perioral numbness, etc... CXR/ Abd films= neg;  Labs all WNL x UTI> Rx Bacterim... Improved, still sl weak, wants something for dry cough... note: some of ER presentation symptoms sound like hypervent syndrome> encouraged to take the Alpraz 0.5mg  Bid regularly.   ~  May 11, 2010:  she went to the ER again 7/11 w/ facial numbness& tingling- nothing found, & she decided to stop the Aricept... CT Br w/ atrophy, sm vessel dis, atherosclerotic changes, NAD.Marland Kitchen. subseq MRA for f/u sm left paraophthalmic art aneurysm was unchanged... CDopplers 8/11 showed mild calcif plaque bilat, 0-39% bilat ICA stenoses, & incidental thyroid nodule... they insisted on eval of thyroid nodule & Sonar revealed multinodular thyroid & bx of dom nodule= non-neoplastic goiter... BP remains stable on meds;  denies CP, palpit, ch in SOB, etc;  not fasting today for blood work- OK Flu shot.   ~  June 12, 2010:  she has persist/ recurrent numbness & tingling, and family notes more forgetful even after restarting the Aricept 10mg  "they won't let me drive" she says... we discussed f/u Neuro eval for these symptoms (prev eval DrWillis)... she had some right leg pain "it's from my back" & sched for epid shot tomorrow (ASA/ Plavix on hold)... Breathing is at baseline;  BP stable on meds;  Lipids have been good, thyroid stable etc...   Current Problem List  ASTHMATIC BRONCHITIS, ACUTE (ICD-466.0) - she is a never smoker...  breathing has been good... but c/o dry cough, min yellow sputum, she wants cough syrup- OK Hydromet Prn + Mucinex DM 2Bid w/ fluids...  HYPERTENSION (ICD-401.9) -  controlled on TOPROL XL 100/d,  EXFORGE 5-160 daily,  LASIX 40mg /d, KCl 39mEq/d, & IMDUR 30mg - 1/2tab/d... BP today= 116/80 today & even better at home- notes reduced symptoms of lightheadedness & denies HA, visual changes, CP, palipit, syncope, edema, etc...  ~  labs 4/11 showed normal BMet... & norm labs in ER 5/11 x BS=189.  ARTERIOSCLEROTIC HEART DISEASE (ICD-414.00) - on IMDUR 30mg - 1/2 tab daily, plus ASA 81mg /d & PLAVIX 75mg /d...  ~  cath 1998 & 2003 w/ LAD lesion ~50%, no other abn seen...  ~  neg Myoview 11/08 w/o ischemia and EF=83%.  ~  saw DrRoss on 2/09 w/ review of 11/08 hosp for atypical CP...  doing satis without recurrent CP but too sedentary and difficulty getting around w/ her arthritis...  ~  last saw DrRoss Jan10- stable, same meds.  VENOUS INSUFFICIENCY, CHRONIC (ICD-459.81) - she has chronic venous insuffic changes in her LE's w/ edema, bruising, etc... she knows to elim sodium, elevate legs, wear support hose when able, etc...  HYPERCHOLESTEROLEMIA (ICD-272.0) - on SIMVASTATIN 40mg /d & FISH OIL...  ~  FLP 9/08 showed TChol 144, TG 204, HDL 34, LDL 72  ~  FLP 3/09 showed TChol 143, TG 178, HDL 33, LDL 74... rec- contin meds, incr exercise.  ~  FLP 1/10 showed TChol 150, TG 162, HDL 39, LDL 79... DrRoss rec same meds, better diet.  ~  FLP 7/10 showed TChol 143, TG 176, HDL 37, LDL 71  ~  FLP 4/11 showed TChol 154, TG 154, HDL 44, LDL 79  HYPOTHYROIDISM (ICD-244.9) - on LEVOTHYROID 82mcg/d...  ~  labs 3/09 showed TSH= 0.98  ~  labs 7/10 showed TSH= 1.37  ~  labs 4/11 showed TSH= 1.39  ~  CDopplers 8/11 showed incidental thyroid nodule; subseq Thyroid Ultrasound showed mult bilat nodules & bx ofdom lesion= non-neoplastic goiter. continue same med.  GERD (ICD-530.81) - EGD [facility] 6/06 showed HH, mild gastritis...  ~  last saw [facility] Aug09 w/ Carafate added (off now).  DIVERTICULOSIS OF COLON (ICD-562.10) - followed by [facility] and last colonoscopy was 4/08 showing  divertics, 2 sm polyps (tubular adenomas), & hems...  CHOLELITHIASIS (ICD-574.20) - seen on prev CTAbd 4/08... s/p lap chole 6/08 by DrHoxworth...  URINARY TRACT INFECTION, CHRONIC (ICD-599.0) - mult cultures in 2009 showed sens EColi & Rx w/ Cipro... we discussed the need for Urology eval if UTI's continue to recur (for further eval).  ~  5/11:  ER eval for mult symptoms and found to have a UTI- treated w/ Bacterim & resolved.  DEGENERATIVE JOINT DISEASE (ICD-715.90) - she has severe DJD w/ prev bilat THR, right TKR, and left femur fx w/ pinning 2003... c/o difficulty w/ ambulation, exercise, etc... she takes Glucoamine Bid + MVI etc...  ~  labs 7/10 showed Vit D level = 31... rec> start Vit D 1000 u daily.  ~  11/11:  c/o right leg pain & she is sched for epid shot  TIA (ICD-435.9) & INTRACRANIAL ANEURYSM (ICD-437.3) - had left side numbness 1996- saw DrWeymann> MRI was neg... Rx w/ ASA.  ~  seen by TParrett 4/09 w/ numbness & paresthesias- norm sed, B12, folate... improved w/ reassurance.  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 PLAVIX 75mg /d added.  ~  ER 5/11 & 7/11> facial numbness & tingling w/ nothing found & she decided to stop the Aricept... CT Br w/ atrophy, sm vessel dis, atherosclerotic changes, NAD.Marland Kitchen. subseq MRA for f/u sm left paraophthalmic art aneurysm was unchanged... CDopplers 8/11 showed mild calcif plaque bilat, 0-39% bilat ICA stenoses, & incidental thyroid nodule.  MEMORY LOSS (ICD-780.93) - started on ARICEPT 2/09 and sl better over time... she does her own meds w/ daughter checking on her...  ~  10/11:  pt had stopped Aricept on her own & offered restart vs Neuro consult> she decided to restart.  ANXIETY (ICD-300.00) - on ALPRAZOLAM 0.5mg  Bid,  & AMITRIPTYLINE 25mg Qhs...  Hx of ANEMIA (ICD-285.9) - Hx of anemia in the  past... Hg has remained WNL since then...  ~  labs 7/10 showed Hg= 14.0  ~  labs 4/11 showed Hg= 14.7   Preventive Screening-Counseling & Management  Alcohol-Tobacco     Smoking Status: never  Caffeine-Diet-Exercise     Does Patient Exercise: no  Allergies: 1)  ! Floxin 2)  ! Morphine  Comments:  Nurse/Medical Assistant: The patient's medications and allergies were reviewed with the patient and were updated in the Medication and Allergy Lists.  Past History:  Past Medical History: ALLERGIC RHINITIS (ICD-477.9) Hx of ASTHMATIC BRONCHITIS, ACUTE (ICD-466.0) HYPERTENSION (ICD-401.9) ARTERIOSCLEROTIC HEART DISEASE (ICD-414.00) VENOUS INSUFFICIENCY, CHRONIC (ICD-459.81) HYPERCHOLESTEROLEMIA (ICD-272.0) HYPOTHYROIDISM (ICD-244.9) NONTOXIC MULTINODULAR GOITER (ICD-241.1) GERD (ICD-530.81) DIVERTICULOSIS OF COLON (ICD-562.10) CHOLELITHIASIS (ICD-574.20) URINARY TRACT INFECTION, CHRONIC (ICD-599.0) DEGENERATIVE JOINT DISEASE (ICD-715.90) TIA (ICD-435.9) MEMORY LOSS (ICD-780.93) INTRACRANIAL ANEURYSM (ICD-437.3) ANXIETY (ICD-300.00) ANEMIA (ICD-285.9)  Past Surgical History: S/P lap cholecystectomy 6/08 by DrHoxsworth S/P hysterectomy S/P bilat THR's S/P right TKR 4/01 by DrAplington S/P left femur fracture nail 9/03 by DrNorris S/P lumbar fusion  Family History: Reviewed history from 12/24/2007 and no changes required. mother died at age 7 from heart attack father died at age 43 from abscessed liver 4 siblings: 1 brother alive and well at age 13 1 sister alive---hx of cancer age 72 1 sister alive and well age 19 1 sister alive and well age 77  Social History: Reviewed history from 05/11/2010 and no changes required. pt is retired widowed lives alone has 2 children pt does not smoke or drink  Review of Systems      See HPI       The patient complains of decreased hearing, dyspnea on exertion, muscle weakness, and difficulty walking.  The patient denies  anorexia, fever, weight loss, weight gain, vision loss, hoarseness, chest pain, syncope, peripheral edema, prolonged cough, headaches, hemoptysis, abdominal pain, melena, hematochezia, severe indigestion/heartburn, hematuria, incontinence, suspicious skin lesions, transient blindness, depression, unusual weight change, abnormal bleeding, enlarged lymph nodes, and angioedema.    Vital Signs:  Patient profile:   77 year old female Height:      63 inches Weight:      187 pounds BMI:     33.25 O2 Sat:      99 % on Room air Temp:     96.6 degrees F oral Pulse rate:   57 / minute BP sitting:   116 / 80  (left arm) Cuff size:   regular  Vitals Entered By: [name] CMA (June 12, 2010 11:03 AM)  O2 Sat at Rest %:  99 O2 Flow:  Room air CC: 1 month ROV & add-on for memory prob... Is Patient Diabetic? No Pain Assessment Patient in pain? no      Comments meds updated today with pt   Physical Exam  Additional Exam:  WD, Overweight, [name] in NAD... GENERAL:  Alert & oriented; pleasant & cooperative... HEENT:  [place]/AT, EOM-wnl, PERRLA, EACs-clear, TMs-wnl, NOSE-clear, THROAT-clear & wnl. NECK:  Supple w/ fairROM; no JVD; normal carotid impulses w/o bruits; no thyromegaly or nodules palpated; no lymphadenopathy. CHEST:  Clear to P & A; without wheezes/ rales/ or rhonchi heard... HEART:  Regular Rhythm; without murmurs/ rubs/ or gallops detected... ABDOMEN:  Soft & nontender; normal bowel sounds; no organomegaly or masses palpated... EXT:  mod arthritic changes; +varicose veins/ +venous insuffic/ 1+ edema;  intact pulses in LE's... NEURO:  CN's intact; no focal neuro deficit... DERM:  No lesions noted; no rash etc...    Impression & Recommendations:  Problem # 1:  NUMBNESS (ICD-782.0) She has requested Neuro consult... continue current Rx w/ ASA/ Plavix/ Arcept. Orders: Neurology Referral (Neuro)  Problem # 2:  HYPERTENSION (ICD-401.9) BP well controlled> tol meds  well... Her updated medication list for this problem includes:    Toprol Xl 100 Mg Tb24 (Metoprolol succinate) .Marland Kitchen... 1 by mouth once daily    Exforge 5-160 Mg Tabs (Amlodipine besylate-valsartan) .Marland Kitchen... Take 1 tab by mouth once daily...    Furosemide 40 Mg Tabs (Furosemide) .Marland Kitchen... Take 1 tablet by mouth once a day  Problem # 3:  ARTERIOSCLEROTIC HEART DISEASE (ICD-414.00) Stable w/o angina, but too sedentary... rec incr activity. Her updated medication list for this problem includes:    Adult Aspirin Low Strength 81 Mg Tbdp (Aspirin) .Marland Kitchen... Take 1 tablet by mouth once a day    Plavix  75 Mg Tabs (Clopidogrel bisulfate) .Marland Kitchen... Take 1 tablet by mouth once a day    Toprol Xl 100 Mg Tb24 (Metoprolol succinate) .Marland Kitchen... 1 by mouth once daily    Exforge 5-160 Mg Tabs (Amlodipine besylate-valsartan) .Marland Kitchen... Take 1 tab by mouth once daily...    Isosorbide Mononitrate Cr 30 Mg Tb24 (Isosorbide mononitrate) .Marland Kitchen... 1/2 tab once daily    Furosemide 40 Mg Tabs (Furosemide) .Marland Kitchen... Take 1 tablet by mouth once a day  Problem # 4:  HYPERCHOLESTEROLEMIA (ICD-272.0) FLP is stable on diet + med... keep same. Her updated medication list for this problem includes:    Simvastatin 40 Mg Tabs (Simvastatin) .Marland Kitchen... Take 1 tablet by mouth once a day  Problem # 5:  NONTOXIC MULTINODULAR GOITER (ICD-241.1) Thyroid work up was neg & she is reassured...  Problem # 6:  TIA (ICD-435.9) She has been stable on ASA/ Plavix... Her updated medication list for this problem includes:    Adult Aspirin Low Strength 81 Mg Tbdp (Aspirin) .Marland Kitchen... Take 1 tablet by mouth once a day    Plavix 75 Mg Tabs (Clopidogrel bisulfate) .Marland Kitchen... Take 1 tablet by mouth once a day  Orders: Neurology Referral (Neuro)  Problem # 7:  INTRACRANIAL ANEURYSM (ICD-437.3) Small left paraophthalmic art aneurysm was unchanged on f/u scan via ER as noted... Orders: Neurology Referral (Neuro)  Problem # 8:  MEMORY LOSS (ICD-780.93) Pt & daughter will fell better once  Neuro consultation is obtained... Orders: Neurology Referral (Neuro)  Complete Medication List: 1)  Adult Aspirin Low Strength 81 Mg Tbdp (Aspirin) .... Take 1 tablet by mouth once a day 2)  Plavix 75 Mg Tabs (Clopidogrel bisulfate) .... Take 1 tablet by mouth once a day 3)  Toprol Xl 100 Mg Tb24 (Metoprolol succinate) .Marland Kitchen.. 1 by mouth once daily 4)  Exforge 5-160 Mg Tabs (Amlodipine besylate-valsartan) .... Take 1 tab by mouth once daily.Marland KitchenMarland Kitchen 5)  Isosorbide Mononitrate Cr 30 Mg Tb24 (Isosorbide mononitrate) .... 1/2 tab once daily 6)  Furosemide 40 Mg Tabs (Furosemide) .... Take 1 tablet by mouth once a day 7)  Klor-con M20 20 Meq Tbcr (Potassium chloride crys cr) .... Take 1 tablet by mouth once a day 8)  Simvastatin 40 Mg Tabs (Simvastatin) .... Take 1 tablet by mouth once a day 9)  Omega-3 Fish Oil 1000 Mg Caps (Omega-3 fatty acids) .... Take 2 capsules by mouth once daily 10)  Levothyroxine Sodium 75 Mcg Tabs (Levothyroxine sodium) .... Take 1 tablet by mouth once a day 11)  Cosamin Ds 500-400 Mg Tabs (Glucosamine-chondroitin) .... Take 1 tablet by mouth once a day 12)  Centrum Silver Tabs (Multiple vitamins-minerals) .... Take 1 tablet by mouth once a day 13)  Amitriptyline Hcl 25 Mg Tabs (Amitriptyline hcl) .... Take 1 tab by mouth at bedtime 14)  Alprazolam 0.5 Mg Tabs (Alprazolam) .... Take 1 tablet by mouth two times a day 15)  Mucinex Dm 30-600 Mg Xr12h-tab (Dextromethorphan-guaifenesin) .... Take 2 tabs by mouth two times a day w/ plenty of fluids... 16)  Hydromet 5-1.5 Mg/24ml Syrp (Hydrocodone-homatropine) .... Take 1-2 tsp by mouth every 6 h as needed for cough... 17)  Vitamin D3 400 Unit Tabs (Cholecalciferol) .... Take 1 tablet by mouth once a day 18)  Aricept 10 Mg Tabs (Donepezil hcl) .... Take 1 tab by mouth once daily...  Patient Instructions: 1)  Today we updated your med list- see below.... 2)  Continue your current meds the same... 3)  We will arrange for a Neurology  consultation about the numbness in your right hand & your memory issues.Marland KitchenMarland Kitchen 4)  Call for any problems.Marland KitchenMarland Kitchen 5)  Keep your prev sched follow up visit w/ me.Marland KitchenMarland Kitchen

## 2010-09-05 NOTE — Progress Notes (Signed)
Summary: PLAVIX ISSUE   Phone Note From Other Clinic   Caller: Tammy Summary of Call: [facility]  [phone] Initial call taken by: J REISS RN  Follow-up for Phone Call        Tammy from [facility] called me to see if patient could DC Plavix so she have a thyroid needle biopsy. Advised her that she is on Plavix for a previous left frontal infarct so she needed to contact Neuro or Dr.Nadel since cardiology did not start her Plavix. Follow-up by: Suzan Garibaldi RN

## 2010-09-05 NOTE — Assessment & Plan Note (Signed)
Summary: rov 6 months///kp   Primary Care Provider:  Dr Kriste Basque  CC:  4 mont ROV & review of mult medical problems....  History of Present Illness: [name] here for a follow up visit... she has multiple medical problems as noted below...    ~  Apr10:  pt fell in her yard 2d ago & laid there for 2H before help arrived- no apparent injury... she's had bilat THRs and right TKR in past> we discussed PhysTherapy for her...   She awoke today w/ numbness in her face bilaterally and in both arms- mostly hands... denies weakness anywhere, speech prob, etc... she notes hands stay numb most of the time & we discussed CTS & need for wrist splints... she had similar numbness & paresthesias in the past w/ neuro eval DrWeymann in 1996 w/ neg MRI (?TIA)... also seen 4/09 w/ similar symptoms & labs all normal- she was reassured and symptoms improved... 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 Plavix added.  ~  Jul10:  she is c/o incr edema- has VI weight up 5# on low sodium diet & Lasix 40mg /d... also notes sweating, bruising, no appetite, no energy, etc...   ~  Nov10:  9mo f/u doing satis- wt up 2# to 198#, she has 1-2+ edema but NOT restricting salt... BP controlled, prev Chol looked great on meds, stable neurologically... OK flu shot today.   ~  November 11, 2009:  she states "I feel pretty good" w/o new complaints or concerns today x bruising w/ thin skin & her ASA/ Plavix... BP controlled on meds;  no angina w/ activities;  Chol controlled on the Simva40;  & stable on her other meds as listed...   ~  January 09, 2010:  Post ER f/u> seen in ER 12/25/09 w/ mult symptoms- CC= "weakness", n/v, constip, shaking, perioral numbness, etc... CXR/ Abd films= neg;  Labs all WNL x UTI> Rx Bacterim... Improved, still sl weak, wants something for dry cough... note: some of ER presentation symptoms sound  like hypervent syndrome> encouraged to take the Alpraz 0.5mg  Bid regularly.   ~  May 11, 2010:  she went to the ER again 7/11 w/ facial numbness& tingling- nothing found, & she decided to stop the Aricept... CT Br w/ atrophy, sm vessel dis, atherosclerotic changes, NAD.Marland Kitchen. subseq MRA for f/u sm left paraophthalmic art aneurysm was unchanged... CDopplers 8/11 showed mild calcif plaque bilat, 0-39% bilat ICA stenoses, & incidental thyroid nodule... they insisted on eval of thyroid nodule & Sonar revealed multinodular thyroid & bx of dom nodule= non-neoplastic goiter... BP remains stable on meds;  denies CP, palpit, ch in SOB, etc;  not fasting today for blood work- OK Flu shot.   Current Problem List  ASTHMATIC BRONCHITIS, ACUTE (ICD-466.0) - she is a never smoker...  breathing has been good... but c/o dry cough, min yellow sputum, she wants cough syrup- OK Hydromet Prn + Mucinex DM 2Bid w/ fluids...  HYPERTENSION (ICD-401.9) - controlled on TOPROL XL 100/d,  EXFORGE 5-160 daily,  LASIX 40mg /d, KCl 64mEq/d, & IMDUR 30mg - 1/2tab/d... BP today= 118/60 today & even better at home- notes reduced symptoms of lightheadedness & denies HA, visual changes, CP, palipit, syncope, edema, etc...  ~  labs 4/11 showed normal BMet... & norm labs in ER 5/11 x BS=189.  ARTERIOSCLEROTIC HEART DISEASE (ICD-414.00) - on IMDUR 30mg - 1/2 tab daily,  plus ASA 81mg /d & PLAVIX 75mg /d...  ~  cath 1998 & 2003 w/ LAD lesion ~50%, no other abn seen...  ~  neg Myoview 11/08 w/o ischemia and EF=83%.  ~  saw DrRoss on 2/09 w/ review of 11/08 hosp for atypical CP...  doing satis without recurrent CP but too sedentary and difficulty getting around w/ her arthritis...  ~  last saw DrRoss Jan10- stable, same meds.  VENOUS INSUFFICIENCY, CHRONIC (ICD-459.81) - she has chronic venous insuffic changes in her LE's w/ edema, bruising, etc... she knows to elim sodium, elevate legs, wear support hose when able, etc...  HYPERCHOLESTEROLEMIA  (ICD-272.0) - on SIMVASTATIN 40mg /d & FISH OIL...  ~  FLP 9/08 showed TChol 144, TG 204, HDL 34, LDL 72  ~  FLP 3/09 showed TChol 143, TG 178, HDL 33, LDL 74... rec- contin meds, incr exercise.  ~  FLP 1/10 showed TChol 150, TG 162, HDL 39, LDL 79... DrRoss rec same meds, better diet.  ~  FLP 7/10 showed TChol 143, TG 176, HDL 37, LDL 71  ~  FLP 4/11 showed TChol 154, TG 154, HDL 44, LDL 79  HYPOTHYROIDISM (ICD-244.9) - on LEVOTHYROID 64mcg/d...  ~  labs 3/09 showed TSH= 0.98  ~  labs 7/10 showed TSH= 1.37  ~  labs 4/11 showed TSH= 1.39  ~  CDopplers 8/11 showed incidental thyroid nodule; subseq Thyroid Ultrasound showed mult bilat nodules & bx ofdom lesion= non-neoplastic goiter. continue same med.  GERD (ICD-530.81) - EGD by [facility] 6/06 showed HH, mild gastritis...  ~  last saw [facility] Aug09 w/ Carafate added (off now).  DIVERTICULOSIS OF COLON (ICD-562.10) - followed by [facility] and last colonoscopy was 4/08 showing divertics, 2 sm polyps (tubular adenomas), & hems...  CHOLELITHIASIS (ICD-574.20) - seen on prev CTAbd 4/08... s/p lap chole 6/08 by DrHoxworth...  URINARY TRACT INFECTION, CHRONIC (ICD-599.0) - mult cultures in 2009 showed sens EColi & Rx w/ Cipro... we discussed the need for Urology eval if UTI's continue to recur (for further eval).  ~  5/11:  ER eval for mult symptoms and found to have a UTI- treated w/ Bacterim & resolved.  DEGENERATIVE JOINT DISEASE (ICD-715.90) - she has severe DJD w/ prev bilat THR, right TKR, and left femur fx w/ pinning 2003... c/o difficulty w/ ambulation, exercise, etc... she takes Glucoamine Bid + MVI etc...  ~  labs 7/10 showed Vit D level = 31... rec> start Vit D 1000 u daily.  TIA (ICD-435.9) & INTRACRANIAL ANEURYSM (ICD-437.3) - had left side numbness 1996- saw DrWeymann> MRI was neg... Rx w/ ASA.  ~  seen by TParrett 4/09 w/ numbness & paresthesias- norm sed, B12, folate... improved w/ reassurance.  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 PLAVIX 75mg /d added.  ~  ER 5/11 & 7/11> facial numbness & tingling w/ nothing found & she decided to stop the Aricept... CT Br w/ atrophy, sm vessel dis, atherosclerotic changes, NAD.Marland Kitchen. subseq MRA for f/u sm left paraophthalmic art aneurysm was unchanged... CDopplers 8/11 showed mild calcif plaque bilat, 0-39% bilat ICA stenoses, & incidental thyroid nodule.  MEMORY LOSS (ICD-780.93) - started on ARICEPT 2/09 and sl better over time... she does her own meds w/ daughter checking on her...  ~  10/11:  pt had stopped Aricept on her own & offered restart vs Neuro consult.  ANXIETY (ICD-300.00) -  on ALPRAZOLAM 0.5mg  Bid,  & AMITRIPTYLINE 25mg Qhs...  Hx of ANEMIA (ICD-285.9) - Hx of anemia in the past... Hg has remained WNL since then...  ~  labs 7/10 showed Hg= 14.0  ~  labs 4/11 showed Hg= 14.7   Preventive Screening-Counseling & Management  Alcohol-Tobacco     Smoking Status: never  Caffeine-Diet-Exercise     Does Patient Exercise: no  Allergies: 1)  ! Floxin 2)  ! Morphine  Comments:  Nurse/Medical Assistant: The patient's medications and allergies were reviewed with the patient and were updated in the Medication and Allergy Lists.  Past History:  Past Medical History: ALLERGIC RHINITIS (ICD-477.9) Hx of ASTHMATIC BRONCHITIS, ACUTE (ICD-466.0) HYPERTENSION (ICD-401.9) ARTERIOSCLEROTIC HEART DISEASE (ICD-414.00) VENOUS INSUFFICIENCY, CHRONIC (ICD-459.81) HYPERCHOLESTEROLEMIA (ICD-272.0) HYPOTHYROIDISM (ICD-244.9) NONTOXIC MULTINODULAR GOITER (ICD-241.1) GERD (ICD-530.81) DIVERTICULOSIS OF COLON (ICD-562.10) CHOLELITHIASIS (ICD-574.20) URINARY TRACT INFECTION, CHRONIC (ICD-599.0) DEGENERATIVE JOINT DISEASE (ICD-715.90) TIA (ICD-435.9) MEMORY LOSS (ICD-780.93) INTRACRANIAL ANEURYSM (ICD-437.3) ANXIETY (ICD-300.00) ANEMIA  (ICD-285.9)  Past Surgical History: S/P lap cholecystectomy 6/08 by DrHoxsworth S/P hysterectomy S/P bilat THR's S/P right TKR 4/01 by DrAplington S/P left femur fracture nail 9/03 by DrNorris S/P lumbar fusion  Family History: Reviewed history from 12/24/2007 and no changes required. mother died at age 11 from heart attack father died at age 30 from abscessed liver 4 siblings: 1 brother alive and well at age 81 1 sister alive---hx of cancer age 28 1 sister alive and well age 76 1 sister alive and well age 51  Social History: Reviewed history from 07-14-2009 and no changes required. pt is retired widowed lives alone has 2 children pt does not smoke or drink  Review of Systems      See HPI       The patient complains of dyspnea on exertion, muscle weakness, and difficulty walking.  The patient denies anorexia, fever, weight loss, weight gain, vision loss, decreased hearing, hoarseness, chest pain, syncope, peripheral edema, prolonged cough, headaches, hemoptysis, abdominal pain, melena, hematochezia, severe indigestion/heartburn, hematuria, incontinence, suspicious skin lesions, transient blindness, depression, unusual weight change, abnormal bleeding, enlarged lymph nodes, and angioedema.    Vital Signs:  Patient profile:   77 year old female Height:      63 inches Weight:      190.25 pounds BMI:     33.82 O2 Sat:      93 % on Room air Temp:     97.1 degrees F oral Pulse rate:   66 / minute BP sitting:   118 / 60  (right arm) Cuff size:   regular  Vitals Entered By: [name] CMA (May 11, 2010 10:55 AM)  O2 Sat at Rest %:  93 O2 Flow:  Room air CC: 4 mont ROV & review of mult medical problems... Is Patient Diabetic? No Pain Assessment Patient in pain? no      Comments meds updated today with pt---pt brought all of her meds today   Physical Exam  Additional Exam:  WD, Overweight, 76 y/o WF in NAD... GENERAL:  Alert & oriented; pleasant &  cooperative... HEENT:  Hecker/AT, EOM-wnl, PERRLA, EACs-clear, TMs-wnl, NOSE-clear, THROAT-clear & wnl. NECK:  Supple w/ fairROM; no JVD; normal carotid impulses w/o bruits; no thyromegaly or nodules palpated; no lymphadenopathy. CHEST:  Clear to P & A; without wheezes/ rales/ or rhonchi heard... HEART:  Regular Rhythm; without murmurs/ rubs/ or gallops detected... ABDOMEN:  Soft & nontender; normal bowel sounds; no organomegaly or masses palpated... EXT:  mod arthritic changes; +varicose veins/ +venous insuffic/ 1+  edema;  intact pulses in LE's... NEURO:  CN's intact; no focal neuro deficit... DERM:  No lesions noted; no rash etc...    MISC. Report  Procedure date:  05/11/2010  Findings:      DATA REVIEWED:  ~  ER visit 12/25/09 & 02/26/10 w/ Labs, XRays, & Scans...  ~  CT Brain, MRI/ MRA, CDopplers, Thyroid Ultrasound & Bx...    Impression & Recommendations:  Problem # 1:  HYPERTENSION (ICD-401.9) Controlled>  same meds. Her updated medication list for this problem includes:    Toprol Xl 100 Mg Tb24 (Metoprolol succinate) .Marland Kitchen... 1 by mouth once daily    Exforge 5-160 Mg Tabs (Amlodipine besylate-valsartan) .Marland Kitchen... Take 1 tab by mouth once daily...    Furosemide 40 Mg Tabs (Furosemide) .Marland Kitchen... Take 1 tablet by mouth once a day  Problem # 2:  ARTERIOSCLEROTIC HEART DISEASE (ICD-414.00) Stable>  no angina, continue same meds... needs incr exercise program... Her updated medication list for this problem includes:    Adult Aspirin Low Strength 81 Mg Tbdp (Aspirin) .Marland Kitchen... Take 1 tablet by mouth once a day    Plavix 75 Mg Tabs (Clopidogrel bisulfate) .Marland Kitchen... Take 1 tablet by mouth once a day    Toprol Xl 100 Mg Tb24 (Metoprolol succinate) .Marland Kitchen... 1 by mouth once daily    Exforge 5-160 Mg Tabs (Amlodipine besylate-valsartan) .Marland Kitchen... Take 1 tab by mouth once daily...    Isosorbide Mononitrate Cr 30 Mg Tb24 (Isosorbide mononitrate) .Marland Kitchen... 1/2 tab once daily    Furosemide 40 Mg Tabs (Furosemide) .Marland Kitchen...  Take 1 tablet by mouth once a day  Problem # 3:  HYPERCHOLESTEROLEMIA (ICD-272.0) Continue Simva40, Fish Oil, + diet... Her updated medication list for this problem includes:    Simvastatin 40 Mg Tabs (Simvastatin) .Marland Kitchen... Take 1 tablet by mouth once a day  Problem # 4:  HYPOTHYROIDISM (ICD-244.9) She has Multinod thyroid & Bx of dominent nodule = non neoplastic goiter... continue same med. Her updated medication list for this problem includes:    Levothyroxine Sodium 75 Mcg Tabs (Levothyroxine sodium) .Marland Kitchen... Take 1 tablet by mouth once a day  Problem # 5:  NEURO>>> She has atherosclerotic changes c/w age- sm vessel dis, atrophy, memory loss, etc... on ASA & PLAVIX daily... also has sm left paraophthalmic art aneurysm w/o change xyrs... I explained that she is covered w/ all the best meds for theser problems... offered Neuro eval & review... she will restart the Aricept she stopped on her own...  Problem # 6:  DEGENERATIVE JOINT DISEASE (ICD-715.90) She's had bilat THR, right TKR, & left femur fx w/ pinning...  Her updated medication list for this problem includes:    Adult Aspirin Low Strength 81 Mg Tbdp (Aspirin) .Marland Kitchen... Take 1 tablet by mouth once a day  Problem # 7:  ANXIETY (ICD-300.00) She would benefit from taking the Alpraz more regularly... Her updated medication list for this problem includes:    Amitriptyline Hcl 25 Mg Tabs (Amitriptyline hcl) .Marland Kitchen... Take 1 tab by mouth at bedtime    Alprazolam 0.5 Mg Tabs (Alprazolam) .Marland Kitchen... Take 1 tablet by mouth two times a day  Problem # 8:  OTHER MEDICAL PROBLEMS AS NOTED>>>  Complete Medication List: 1)  Adult Aspirin Low Strength 81 Mg Tbdp (Aspirin) .... Take 1 tablet by mouth once a day 2)  Plavix 75 Mg Tabs (Clopidogrel bisulfate) .... Take 1 tablet by mouth once a day 3)  Toprol Xl 100 Mg Tb24 (Metoprolol succinate) .Marland Kitchen.. 1 by mouth once daily  4)  Exforge 5-160 Mg Tabs (Amlodipine besylate-valsartan) .... Take 1 tab by mouth once  daily.Marland KitchenMarland Kitchen 5)  Isosorbide Mononitrate Cr 30 Mg Tb24 (Isosorbide mononitrate) .... 1/2 tab once daily 6)  Furosemide 40 Mg Tabs (Furosemide) .... Take 1 tablet by mouth once a day 7)  Klor-con M20 20 Meq Tbcr (Potassium chloride crys cr) .... Take 1 tablet by mouth once a day 8)  Simvastatin 40 Mg Tabs (Simvastatin) .... Take 1 tablet by mouth once a day 9)  Omega-3 Fish Oil 1000 Mg Caps (Omega-3 fatty acids) .... Take 2 capsules by mouth once daily 10)  Levothyroxine Sodium 75 Mcg Tabs (Levothyroxine sodium) .... Take 1 tablet by mouth once a day 11)  Cosamin Ds 500-400 Mg Tabs (Glucosamine-chondroitin) .... Take 1 tablet by mouth once a day 12)  Centrum Silver Tabs (Multiple vitamins-minerals) .... Take 1 tablet by mouth once a day 13)  Amitriptyline Hcl 25 Mg Tabs (Amitriptyline hcl) .... Take 1 tab by mouth at bedtime 14)  Alprazolam 0.5 Mg Tabs (Alprazolam) .... Take 1 tablet by mouth two times a day 15)  Mucinex Dm 30-600 Mg Xr12h-tab (Dextromethorphan-guaifenesin) .... Take 2 tabs by mouth two times a day w/ plenty of fluids... 16)  Hydromet 5-1.5 Mg/45ml Syrp (Hydrocodone-homatropine) .... Take 1-2 tsp by mouth every 6 h as needed for cough... 17)  Vitamin D3 400 Unit Tabs (Cholecalciferol) .... Take 1 tablet by mouth once a day 18)  Aricept 10 Mg Tabs (Donepezil hcl) .... Take 1 tab by mouth once daily...  Patient Instructions: 1)  Today we updated your med list- see below.... 2)  Continue your current meds the same for now.Marland KitchenMarland KitchenWe wrote a new perscription for the non-generic ARICEPT so you can price it at the Pharmacy.Marland KitchenMarland Kitchen 3)  We gave you the 2011 Flu vaccine today... 4)  Stay as active as possible... 5)  Please schedule a follow-up appointment in 4-6 months. Prescriptions: ARICEPT 10 MG TABS (DONEPEZIL HCL) take 1 tab by mouth once daily...  #30 x 12   Entered and Authorized by:   Michele Mcalpine MD   Signed by:   Michele Mcalpine MD on 05/11/2010   Method used:   Print then Give to Patient    RxID:   [id number]

## 2010-09-07 NOTE — Assessment & Plan Note (Signed)
Summary: YEARLY/SL    Visit Type:  1 year follow up Primary Andrea Mccoy:  Dr Kriste Basque  CC:  Hands numbness.  History of Present Illness: Patient is a 77 year old with a history of CAD (cath in 2003, myoview in 2008, normal).  Also a history of HTN, dyslipididemia, TIA.  I saw hier in clinic 1 year ago. Since seen she has been followed by Jodelle Green on a regular basis.  She has had complaints about UE numbness.  She has also been noted to have membory problems. Since I saw her she denies chest pains.  No signif SOB with usual activity. She denies signif dizziness.  She has had a fall.  She lives alone.  Current Medications (verified): 1)  Adult Aspirin Low Strength 81 Mg  Tbdp (Aspirin) .... Take 1 Tablet By Mouth Once A Day 2)  Plavix 75 Mg Tabs (Clopidogrel Bisulfate) .... Take 1 Tablet By Mouth Once A Day 3)  Toprol Xl 100 Mg  Tb24 (Metoprolol Succinate) .Marland Kitchen.. 1 By Mouth Once Daily 4)  Exforge 5-160 Mg Tabs (Amlodipine Besylate-Valsartan) .... Take 1 Tab By Mouth Once Daily.Marland KitchenMarland Kitchen 5)  Isosorbide Mononitrate Cr 30 Mg  Tb24 (Isosorbide Mononitrate) .... 1/2 Tab Once Daily 6)  Furosemide 40 Mg  Tabs (Furosemide) .... Take 1 Tablet By Mouth Once A Day 7)  Klor-Con M20 20 Meq  Tbcr (Potassium Chloride Crys Cr) .... Take 1 Tablet By Mouth Once A Day 8)  Simvastatin 40 Mg  Tabs (Simvastatin) .... Take 1 Tablet By Mouth Once A Day 9)  Omega-3 Fish Oil 1000 Mg Caps (Omega-3 Fatty Acids) .... Take 2 Capsules By Mouth Once Daily 10)  Levothyroxine Sodium 75 Mcg  Tabs (Levothyroxine Sodium) .... Take 1 Tablet By Mouth Once A Day 11)  Cosamin Ds 500-400 Mg  Tabs (Glucosamine-Chondroitin) .... Take 1 Tablet By Mouth Once A Day 12)  Centrum Silver   Tabs (Multiple Vitamins-Minerals) .... Take 1 Tablet By Mouth Once A Day 13)  Amitriptyline Hcl 25 Mg  Tabs (Amitriptyline Hcl) .... Take 1 Tab By Mouth At Bedtime 14)  Alprazolam 0.5 Mg  Tabs (Alprazolam) .... Take 1/2-1  Tablet By Mouth Three Times A Day As Needed  Nerves 15)  Mucinex Dm 30-600 Mg Xr12h-Tab (Dextromethorphan-Guaifenesin) .... Take 2 Tabs By Mouth Two Times A Day W/ Plenty of Fluids... 16)  Vitamin D3 400 Unit Tabs (Cholecalciferol) .... Take 1 Tablet By Mouth Once A Day 17)  Aricept 10 Mg Tabs (Donepezil Hcl) .... Take 1 Tab By Mouth Once Daily...  Allergies: 1)  ! Floxin 2)  ! Morphine  Past History:  Past medical, surgical, family and social histories (including risk factors) reviewed, and no changes noted (except as noted below).  Past Medical History: Reviewed history from 06/12/2010 and no changes required. ALLERGIC RHINITIS (ICD-477.9) Hx of ASTHMATIC BRONCHITIS, ACUTE (ICD-466.0) HYPERTENSION (ICD-401.9) ARTERIOSCLEROTIC HEART DISEASE (ICD-414.00) VENOUS INSUFFICIENCY, CHRONIC (ICD-459.81) HYPERCHOLESTEROLEMIA (ICD-272.0) HYPOTHYROIDISM (ICD-244.9) NONTOXIC MULTINODULAR GOITER (ICD-241.1) GERD (ICD-530.81) DIVERTICULOSIS OF COLON (ICD-562.10) CHOLELITHIASIS (ICD-574.20) URINARY TRACT INFECTION, CHRONIC (ICD-599.0) DEGENERATIVE JOINT DISEASE (ICD-715.90) TIA (ICD-435.9) MEMORY LOSS (ICD-780.93) INTRACRANIAL ANEURYSM (ICD-437.3) ANXIETY (ICD-300.00) ANEMIA (ICD-285.9)  Past Surgical History: Reviewed history from 06/12/2010 and no changes required. S/P lap cholecystectomy 6/08 by DrHoxsworth S/P hysterectomy S/P bilat THR's S/P right TKR 4/01 by DrAplington S/P left femur fracture nail 9/03 by DrNorris S/P lumbar fusion  Family History: Reviewed history from 12/24/2007 and no changes required. mother died at age 23 from heart attack father died at age  63 from abscessed liver 4 siblings: 1 brother alive and well at age 72 1 sister alive---hx of cancer age 52 1 sister alive and well age 64 1 sister alive and well age 57  Social History: Reviewed history from 05/11/2010 and no changes required. pt is retired widowed lives alone has 2 children pt does not smoke or drink  Review of Systems        All systems reviewed.  Neg to the above problem except as noted above.  Vital Signs:  Patient profile:   77 year old female Height:      63 inches Weight:      182 pounds BMI:     32.36 Pulse rate:   67 / minute Pulse rhythm:   regular Resp:     18 per minute BP sitting:   112 / 60  (left arm) Cuff size:   large  Vitals Entered By: Vikki Ports (August 04, 2010 1:41 PM)  Physical Exam  Additional Exam:  Patient is in NAD HEENT:  Normocephalic, atraumatic. EOMI, PERRLA.  Neck: JVP is normal.   Lungs: clear to auscultation. No rales no wheezes.  Heart: Regular rate and rhythm. Normal S1, S2. No S3.   No significant murmurs. PMI not displaced.  Abdomen:  Supple, nontender. Normal bowel sounds. No masses. No hepatomegaly.  Extremities:   Good distal pulses throughout. No lower extremity edema.  Musculoskeletal :moving all extremities.  Neuro:   alert and oriented x3.    EKG  Procedure date:  08/04/2010  Findings:      NSR.  67 bpm.  NSST T wave changes.  Impression & Recommendations:  Problem # 1:  HYPERTENSION (ICD-401.9) Good control.  Continue meds.  Problem # 2:  ARTERIOSCLEROTIC HEART DISEASE (ICD-414.00) No symptoms to sugg angina.  Will review med list.  COnitnue for now.  Problem # 3:  HYPERCHOLESTEROLEMIA (ICD-272.0) Continue. Her updated medication list for this problem includes:    Simvastatin 40 Mg Tabs (Simvastatin) .Marland Kitchen... Take 1 tablet by mouth once a day  Patient Instructions: 1)  Your physician recommends that you schedule a follow-up appointment in: 1 year

## 2010-09-07 NOTE — Progress Notes (Signed)
Summary: STILL HAVING NUMBNESS  Phone Note Call from Patient   Caller: DAUGHTER-REGINA [phone] Call For: NADEL Summary of Call: PT IS STILL HAVING NUMBNESS HAS APPT WITH DR Anne Hahn ON 1/31 IS THERE ANYTHING SHE SHOULD DO OR CAN WE SEE HER SOONER Initial call taken by: Lacinda Axon,  July 21, 2010 9:09 AM  Follow-up for Phone Call        Spoke with pt daugher and she states that yesterday the pt was not feeling well and had numbness in face and hands. Per Regina BP was normal, denies headache or blurry vision.  She has an appt with Dr. Anne Hahn on 09-05-09 at neuro. Rene Kocher wants to [facility] is there anythign that can be done in the meantime, or can we get her a sooner appt. She states she spoke with her mother this AM and she states the numbness has improved some this morning. Rene Kocher wanted to [facility] if ther is anythign else that cn be done. please advise.Carron Curie CMA  July 21, 2010 9:51 AM   Additional Follow-up for Phone Call Additional follow up Details #1::        per SN---pt will need to be on a cancellation list to get a sooner appt---cont her current meds for now.  thanks [name] CMA  July 21, 2010 12:37 PM     Additional Follow-up for Phone Call Additional follow up Details #2::    Called Dr. Clarisa Kindred office.  Spoke with [place].  Per Andrey Campanile, she placed pt on cancellation list.    Called, spoke with Rene Kocher.  She is aware per SN, pt needs to be on cancellation list with Dr. Anne Hahn to get an appt sooner but for now cont with same meds.  Rene Kocher verbalized understanding of this and is aware I have spoke with Dr. Anne Hahn office, and they have placed pt on the list. Follow-up by: Gweneth Dimitri RN,  July 21, 2010 12:45 PM

## 2010-09-07 NOTE — Progress Notes (Signed)
Summary: On call- Phone from dtr. Pt's face and hands numb.  Phone Note Call from Patient   Summary of Call: ON CALL- Daughter Hulen Shouts called. Mother c/o numbness in face and both hands all day. Not new, just seems to be lasting longer this time. Pending appointment with Dr Anne Hahn late January. Already on daily BASA. No new features. "Nobody has been able to figure out what this is". I suggested: If concerned enough, take her to ER. Otherwise, put her to bed for now and call Dr Kriste Basque at office tomorrow morning. Daughter was ok with this.  Initial call taken by: Waymon Budge MD,  July 20, 2010 8:48 PM

## 2010-10-03 NOTE — Consult Note (Signed)
Summary: [facility]  [facility]   Imported By: Sherian Rein 09/26/2010 11:22:47  _____________________________________________________________________  External Attachment:    Type:   Image     Comment:   External Document

## 2010-10-13 ENCOUNTER — Ambulatory Visit: Payer: Self-pay | Admitting: Pulmonary Disease

## 2010-10-16 ENCOUNTER — Telehealth (INDEPENDENT_AMBULATORY_CARE_PROVIDER_SITE_OTHER): Payer: Self-pay | Admitting: *Deleted

## 2010-10-18 ENCOUNTER — Ambulatory Visit (INDEPENDENT_AMBULATORY_CARE_PROVIDER_SITE_OTHER): Payer: Medicare PPO | Admitting: Adult Health

## 2010-10-18 ENCOUNTER — Encounter: Payer: Self-pay | Admitting: Adult Health

## 2010-10-18 ENCOUNTER — Other Ambulatory Visit: Payer: Self-pay | Admitting: Adult Health

## 2010-10-18 ENCOUNTER — Other Ambulatory Visit: Payer: Medicare PPO

## 2010-10-18 DIAGNOSIS — N390 Urinary tract infection, site not specified: Secondary | ICD-10-CM

## 2010-10-18 DIAGNOSIS — E7800 Pure hypercholesterolemia, unspecified: Secondary | ICD-10-CM

## 2010-10-18 DIAGNOSIS — I10 Essential (primary) hypertension: Secondary | ICD-10-CM

## 2010-10-18 LAB — CBC WITH DIFFERENTIAL/PLATELET
Basophils Relative: 0.1 % (ref 0.0–3.0)
Eosinophils Absolute: 0.3 10*3/uL (ref 0.0–0.7)
Hemoglobin: 13.6 g/dL (ref 12.0–15.0)
MCHC: 34.3 g/dL (ref 30.0–36.0)
MCV: 100.4 fl — ABNORMAL HIGH (ref 78.0–100.0)
Monocytes Absolute: 0.5 10*3/uL (ref 0.1–1.0)
Neutro Abs: 4.2 10*3/uL (ref 1.4–7.7)
Neutrophils Relative %: 65.1 % (ref 43.0–77.0)
RBC: 3.94 Mil/uL (ref 3.87–5.11)

## 2010-10-18 LAB — BASIC METABOLIC PANEL
BUN: 28 mg/dL — ABNORMAL HIGH (ref 6–23)
Calcium: 9.7 mg/dL (ref 8.4–10.5)
Creatinine, Ser: 1.2 mg/dL (ref 0.4–1.2)
GFR: 47.28 mL/min — ABNORMAL LOW (ref >60.00)

## 2010-10-18 LAB — URINALYSIS, ROUTINE W REFLEX MICROSCOPIC
Bilirubin Urine: NEGATIVE
Ketones, ur: NEGATIVE
Nitrite: NEGATIVE
Urobilinogen, UA: 0.2 (ref 0.0–1.0)

## 2010-10-18 LAB — LIPID PANEL
Cholesterol: 150 mg/dL (ref 0–200)
VLDL: 33.2 mg/dL (ref 0.0–40.0)

## 2010-10-18 LAB — TSH: TSH: 1.16 u[IU]/mL (ref 0.35–5.50)

## 2010-10-18 LAB — HEPATIC FUNCTION PANEL
AST: 24 U/L (ref 0–37)
Albumin: 3.9 g/dL (ref 3.5–5.2)

## 2010-10-19 ENCOUNTER — Ambulatory Visit: Payer: Self-pay | Admitting: Pulmonary Disease

## 2010-10-20 ENCOUNTER — Telehealth: Payer: Self-pay | Admitting: Pulmonary Disease

## 2010-10-21 LAB — URINALYSIS, ROUTINE W REFLEX MICROSCOPIC
Bilirubin Urine: NEGATIVE
Ketones, ur: NEGATIVE mg/dL
Nitrite: NEGATIVE
Urobilinogen, UA: 0.2 mg/dL (ref 0.0–1.0)

## 2010-10-21 LAB — BASIC METABOLIC PANEL
CO2: 24 mEq/L (ref 19–32)
Chloride: 105 mEq/L (ref 96–112)
GFR calc Af Amer: >60 mL/min (ref >60)
Sodium: 139 mEq/L (ref 135–145)

## 2010-10-21 LAB — DIFFERENTIAL
Eosinophils Absolute: 0.1 10*3/uL (ref 0.0–0.7)
Eosinophils Relative: 1 % (ref 0–5)
Lymphs Abs: 1.4 10*3/uL (ref 0.7–4.0)
Monocytes Relative: 9 % (ref 3–12)

## 2010-10-21 LAB — URINE CULTURE

## 2010-10-21 LAB — CBC
HCT: 41.2 % (ref 36.0–46.0)
Hemoglobin: 13.9 g/dL (ref 12.0–15.0)
MCH: 33.3 pg (ref 26.0–34.0)
MCV: 98.5 fL (ref 78.0–100.0)
RBC: 4.18 MIL/uL (ref 3.87–5.11)

## 2010-10-23 LAB — URINALYSIS, ROUTINE W REFLEX MICROSCOPIC
Bilirubin Urine: NEGATIVE
Ketones, ur: NEGATIVE mg/dL
Nitrite: POSITIVE — AB
Protein, ur: 100 mg/dL — AB
Specific Gravity, Urine: 1.018 (ref 1.005–1.030)
Urobilinogen, UA: 0.2 mg/dL (ref 0.0–1.0)

## 2010-10-23 LAB — URINE MICROSCOPIC-ADD ON

## 2010-10-23 LAB — DIFFERENTIAL
Basophils Absolute: 0 10*3/uL (ref 0.0–0.1)
Basophils Relative: 0 % (ref 0–1)
Eosinophils Absolute: 0.1 10*3/uL (ref 0.0–0.7)
Monocytes Absolute: 1.1 10*3/uL — ABNORMAL HIGH (ref 0.1–1.0)
Neutro Abs: 7.6 10*3/uL (ref 1.7–7.7)

## 2010-10-23 LAB — CBC
Hemoglobin: 14.4 g/dL (ref 12.0–15.0)
MCHC: 34.6 g/dL (ref 30.0–36.0)
MCV: 98.1 fL (ref 78.0–100.0)
RDW: 12.9 % (ref 11.5–15.5)

## 2010-10-23 LAB — BASIC METABOLIC PANEL
CO2: 27 mEq/L (ref 19–32)
Calcium: 9.3 mg/dL (ref 8.4–10.5)
Creatinine, Ser: 1 mg/dL (ref 0.4–1.2)
Glucose, Bld: 189 mg/dL — ABNORMAL HIGH (ref 70–99)
Sodium: 138 mEq/L (ref 135–145)

## 2010-10-23 LAB — URINE CULTURE: Colony Count: >=100000

## 2010-10-24 NOTE — Progress Notes (Signed)
Summary: wants to know if we have any samples of alprazolam-lmomtcb x1  Phone Note Call from Patient   Caller: DAUGHter/REGINA SWAIM Call For: Kanen Mottola Summary of Call: patients daughter phoned stated that they received all of her prescriptions from [facility] and the Alprazolam was not in the package and she wants to know if we have any samples in the office. She can be reached at [phone] Initial call taken by: Vedia Coffer,  October 20, 2010 3:25 PM  Follow-up for Phone Call        lmomtcb x1 Carver Fila  October 20, 2010 4:48 PM   Advised we do not have any samples of this medication. Carron Curie CMA  October 20, 2010 5:21 PM

## 2010-10-24 NOTE — Assessment & Plan Note (Signed)
Summary: Acute NP office visit - increased confusion / UTI   Primary Provider/Referring Provider:  Dr Kriste Basque  CC:  increased confusion x4days.  pt reports possible UTI x1 week w/ darker urine, some discomfort when urinating, increased frequency - denies fever, and back pain.Marland Kitchen  History of Present Illness: 77   y/o WF with known history of  HTN, hyperlipidemia, GERD, and Dementia   October 18, 2010--Presents for an acute office visit. Complains of increased confusion x4days.  pt reports possible UTI x1 week w/ darker urine, some discomfort when urinating, increased frequency . Unable to give urine, she used bathroom in waiting area. Denies chest pain, dyspnea, orthopnea, hemoptysis, fever, n/v/d, edema, headache,back pain, hematuria, visual/speech changes, extremity weakness. Last UTI >8 months ago. Ran out of xanax for 1 week restarted 3 days ago confusion and mood swings are better last 2 days.   Medications Prior to Update: 1)  Adult Aspirin Low Strength 81 Mg  Tbdp (Aspirin) .... Take 1 Tablet By Mouth Once A Day 2)  Plavix 75 Mg Tabs (Clopidogrel Bisulfate) .... Take 1 Tablet By Mouth Once A Day 3)  Toprol Xl 100 Mg  Tb24 (Metoprolol Succinate) .Marland Kitchen.. 1 By Mouth Once Daily 4)  Exforge 5-160 Mg Tabs (Amlodipine Besylate-Valsartan) .... Take 1 Tab By Mouth Once Daily.Marland KitchenMarland Kitchen 5)  Isosorbide Mononitrate Cr 30 Mg  Tb24 (Isosorbide Mononitrate) .... 1/2 Tab Once Daily 6)  Furosemide 40 Mg  Tabs (Furosemide) .... Take 1 Tablet By Mouth Once A Day 7)  Klor-Con M20 20 Meq  Tbcr (Potassium Chloride Crys Cr) .... Take 1 Tablet By Mouth Once A Day 8)  Simvastatin 40 Mg  Tabs (Simvastatin) .... Take 1 Tablet By Mouth Once A Day 9)  Omega-3 Fish Oil 1000 Mg Caps (Omega-3 Fatty Acids) .... Take 2 Capsules By Mouth Once Daily 10)  Levothyroxine Sodium 75 Mcg  Tabs (Levothyroxine Sodium) .... Take 1 Tablet By Mouth Once A Day 11)  Cosamin Ds 500-400 Mg  Tabs (Glucosamine-Chondroitin) .... Take 1 Tablet By Mouth Once  A Day 12)  Centrum Silver   Tabs (Multiple Vitamins-Minerals) .... Take 1 Tablet By Mouth Once A Day 13)  Amitriptyline Hcl 25 Mg  Tabs (Amitriptyline Hcl) .... Take 1 Tab By Mouth At Bedtime 14)  Alprazolam 0.5 Mg  Tabs (Alprazolam) .... Take 1/2-1  Tablet By Mouth Three Times A Day As Needed Nerves 15)  Mucinex Dm 30-600 Mg Xr12h-Tab (Dextromethorphan-Guaifenesin) .... Take 2 Tabs By Mouth Two Times A Day W/ Plenty of Fluids... 16)  Vitamin D3 400 Unit Tabs (Cholecalciferol) .... Take 1 Tablet By Mouth Once A Day 17)  Aricept 10 Mg Tabs (Donepezil Hcl) .... Take 1 Tab By Mouth Once Daily...  Current Medications (verified): 1)  Adult Aspirin Low Strength 81 Mg  Tbdp (Aspirin) .... Take 1 Tablet By Mouth Once A Day 2)  Plavix 75 Mg Tabs (Clopidogrel Bisulfate) .... Take 1 Tablet By Mouth Once A Day 3)  Toprol Xl 100 Mg  Tb24 (Metoprolol Succinate) .Marland Kitchen.. 1 By Mouth Once Daily 4)  Exforge 5-160 Mg Tabs (Amlodipine Besylate-Valsartan) .... Take 1 Tab By Mouth Once Daily.Marland KitchenMarland Kitchen 5)  Isosorbide Mononitrate Cr 30 Mg  Tb24 (Isosorbide Mononitrate) .... 1/2 Tab Once Daily 6)  Furosemide 40 Mg  Tabs (Furosemide) .... Take 1 Tablet By Mouth Once A Day 7)  Klor-Con M20 20 Meq  Tbcr (Potassium Chloride Crys Cr) .... Take 1 Tablet By Mouth Once A Day 8)  Simvastatin 40 Mg  Tabs (Simvastatin) .Marland KitchenMarland KitchenMarland Kitchen  Take 1 Tablet By Mouth Once A Day 9)  Omega-3 Fish Oil 1000 Mg Caps (Omega-3 Fatty Acids) .... Take 2 Capsules By Mouth Once Daily 10)  Levothyroxine Sodium 75 Mcg  Tabs (Levothyroxine Sodium) .... Take 1 Tablet By Mouth Once A Day 11)  Cosamin Ds 500-400 Mg  Tabs (Glucosamine-Chondroitin) .... Take 1 Tablet By Mouth Once A Day 12)  Centrum Silver   Tabs (Multiple Vitamins-Minerals) .... Take 1 Tablet By Mouth Once A Day 13)  Alprazolam 0.5 Mg  Tabs (Alprazolam) .... Take 1/2-1  Tablet By Mouth Three Times A Day As Needed Nerves 14)  Mucinex Dm 30-600 Mg Xr12h-Tab (Dextromethorphan-Guaifenesin) .... Take 2 Tabs By Mouth  Two Times A Day W/ Plenty of Fluids... 15)  Vitamin D3 400 Unit Tabs (Cholecalciferol) .... Take 1 Tablet By Mouth Once A Day 16)  Aricept 10 Mg Tabs (Donepezil Hcl) .... Take 1 Tab By Mouth Once Daily...  Allergies (verified): 1)  ! Floxin 2)  ! Morphine  Past History:  Past Medical History: Last updated: 06/12/2010 ALLERGIC RHINITIS (ICD-477.9) Hx of ASTHMATIC BRONCHITIS, ACUTE (ICD-466.0) HYPERTENSION (ICD-401.9) ARTERIOSCLEROTIC HEART DISEASE (ICD-414.00) VENOUS INSUFFICIENCY, CHRONIC (ICD-459.81) HYPERCHOLESTEROLEMIA (ICD-272.0) HYPOTHYROIDISM (ICD-244.9) NONTOXIC MULTINODULAR GOITER (ICD-241.1) GERD (ICD-530.81) DIVERTICULOSIS OF COLON (ICD-562.10) CHOLELITHIASIS (ICD-574.20) URINARY TRACT INFECTION, CHRONIC (ICD-599.0) DEGENERATIVE JOINT DISEASE (ICD-715.90) TIA (ICD-435.9) MEMORY LOSS (ICD-780.93) INTRACRANIAL ANEURYSM (ICD-437.3) ANXIETY (ICD-300.00) ANEMIA (ICD-285.9)  Past Surgical History: Last updated: 06/12/2010 S/P lap cholecystectomy 6/08 by DrHoxsworth S/P hysterectomy S/P bilat THR's S/P right TKR 4/01 by DrAplington S/P left femur fracture nail 9/03 by DrNorris S/P lumbar fusion  Family History: Last updated: 01/10/08 mother died at age 72 from heart attack father died at age 14 from abscessed liver 4 siblings: 1 brother alive and well at age 88 1 sister alive---hx of cancer age 81 1 sister alive and well age 28 1 sister alive and well age 41  Social History: Last updated: 05/11/2010 pt is retired widowed lives alone has 2 children pt does not smoke or drink  Risk Factors: Smoking Status: never (06/12/2010)  Review of Systems      See HPI  Vital Signs:  Patient profile:   77 year old female Height:      63 inches Weight:      182.25 pounds BMI:     32.40 O2 Sat:      98 % on Room air Temp:     97.2 degrees F oral Pulse rate:   55 / minute BP sitting:   140 / 78  (left arm) Cuff size:   large  Vitals Entered By: Boone Master CNA/MA (October 18, 2010 9:50 AM)  O2 Flow:  Room air CC: increased confusion x4days.  pt reports possible UTI x1 week w/ darker urine, some discomfort when urinating, increased frequency - denies fever, back pain. Is Patient Diabetic? No Comments Medications reviewed with patient Daytime contact number verified with patient. Boone Master CNA/MA  October 18, 2010 9:52 AM    Physical Exam  Additional Exam:  WD, Overweight, [name] in NAD... GENERAL:  Alert & oriented; pleasant & cooperative... HEENT:  [place]/AT,  EACs-clear, TMs-wnl, NOSE-clear, THROAT-clear & wnl. NECK:  Supple w/ fairROM; no JVD; normal carotid impulses w/o bruits; no thyromegaly or nodules palpated; no lymphadenopathy. CHEST:  Clear to P & A; without wheezes/ rales/ or rhonchi heard... HEART:  Regular Rhythm; without murmurs/ rubs/ or gallops detected... ABDOMEN:  Soft & nontender; normal bowel sounds; no organomegaly or masses palpated...no guarding or  rebound, neg CVA tenderness.  EXT: mod arthritic changes; +varicose veins/ +venous insuffic/ 1+ edema;  intact pulses in LE's... NEURO:  no focal neuro deficit DERM:  No lesions noted; no rash etc...    Impression & Recommendations:  Problem # 1:  URINARY TRACT INFECTION, CHRONIC (ICD-599.0)  suspect acute UTI Plan;  Begin Cipro 250mg  two times a day for 7 days Fluids and rest Urine hygiene discussed.  Please contact office for sooner follow up if symptoms do not improve or worsen   Her updated medication list for this problem includes:    Cipro 250 Mg Tabs (Ciprofloxacin hcl) .Marland Kitchen... 1 by mouth two times a day  Orders: Est. Patient Level IV (04540) TLB-Udip w/ Micro (81001-URINE) T-Urine Culture (Spectrum Order) [id number])  Medications Added to Medication List This Visit: 1)  Cipro 250 Mg Tabs (Ciprofloxacin hcl) .Marland Kitchen.. 1 by mouth two times a day  Other Orders: TLB-Hepatic/Liver Function Pnl (80076-HEPATIC) TLB-Lipid Panel (80061-LIPID) TLB-CBC  Platelet - w/Differential (85025-CBCD) TLB-BMP (Basic Metabolic Panel-BMET) (80048-METABOL) TLB-TSH (Thyroid Stimulating Hormone) (84443-TSH)  Patient Instructions: 1)  Begin Cipro 250mg  two times a day for 7 days 2)  Fluids and rest 3)  Urine hygiene discussed.  4)  Please contact office for sooner follow up if symptoms do not improve or worsen  5)  follow up Dr. Kriste Basque in 3 months  6)  I will call with labs  Prescriptions: CIPRO 250 MG TABS (CIPROFLOXACIN HCL) 1 by mouth two times a day  #14 x 0   Entered and Authorized by:   Rubye Oaks NP   Signed by:   Rubye Oaks NP on 10/18/2010   Method used:   Electronically to        [facility] (retail)       [address]       [place], [place]  [address]       Ph: [phone]       Fax: [phone]   RxID:   [id number]    Immunization History:  Influenza Immunization History:    Influenza:  historical (05/06/2010)

## 2010-10-24 NOTE — Progress Notes (Signed)
Summary: pt needs to be seen this wk cant keep 3/15 appt  Phone Note Call from Patient   Caller: DAUGHTER/Andrea Mccoy Call For: Andrea Mccoy Summary of Call: Patients daughter Andrea Mccoy phoned stated that Andrea Mccoy had an appt on Friday the 16th and our office called to reschedule for the 115th. she cant bring her mother that day due to her daughter having a byopsy. She wants to know if her mother can be worked in another day this week. His next available isnt until April and she thinks that she needs to be seen this week due to problems I offered Tammy and she delicned. She can be reached at [phone] or [phone] Initial call taken by: Vedia Coffer,  October 16, 2010 10:15 AM  Follow-up for Phone Call        Spoke with pt's daughter Andrea Mccoy.  She states that pt has been feeling confused and forgetting things recently.  She had to cancel 6 month rov with SN and so I sched her to see TP on Wed 3/14 at 9:45 am (I had offered appt today and she refused).  ED or UC sooner if needed was advised. Follow-up by: [name],  October 16, 2010 10:23 AM

## 2010-11-20 ENCOUNTER — Other Ambulatory Visit: Payer: Self-pay | Admitting: Gynecology

## 2010-12-11 ENCOUNTER — Encounter: Payer: Self-pay | Admitting: Pulmonary Disease

## 2010-12-18 ENCOUNTER — Ambulatory Visit (INDEPENDENT_AMBULATORY_CARE_PROVIDER_SITE_OTHER): Payer: Medicare PPO | Admitting: Pulmonary Disease

## 2010-12-18 ENCOUNTER — Other Ambulatory Visit: Payer: Medicare PPO

## 2010-12-18 ENCOUNTER — Encounter: Payer: Self-pay | Admitting: Pulmonary Disease

## 2010-12-18 ENCOUNTER — Other Ambulatory Visit (INDEPENDENT_AMBULATORY_CARE_PROVIDER_SITE_OTHER): Payer: Medicare PPO | Admitting: Pulmonary Disease

## 2010-12-18 DIAGNOSIS — N3000 Acute cystitis without hematuria: Secondary | ICD-10-CM

## 2010-12-18 DIAGNOSIS — R413 Other amnesia: Secondary | ICD-10-CM

## 2010-12-18 DIAGNOSIS — N390 Urinary tract infection, site not specified: Secondary | ICD-10-CM

## 2010-12-18 DIAGNOSIS — F411 Generalized anxiety disorder: Secondary | ICD-10-CM

## 2010-12-18 DIAGNOSIS — E039 Hypothyroidism, unspecified: Secondary | ICD-10-CM

## 2010-12-18 DIAGNOSIS — I2510 Atherosclerotic heart disease of native coronary artery without angina pectoris: Secondary | ICD-10-CM

## 2010-12-18 DIAGNOSIS — E7800 Pure hypercholesterolemia, unspecified: Secondary | ICD-10-CM

## 2010-12-18 DIAGNOSIS — I10 Essential (primary) hypertension: Secondary | ICD-10-CM

## 2010-12-18 DIAGNOSIS — G459 Transient cerebral ischemic attack, unspecified: Secondary | ICD-10-CM

## 2010-12-18 DIAGNOSIS — M1990 Unspecified osteoarthritis, unspecified site: Secondary | ICD-10-CM

## 2010-12-18 DIAGNOSIS — I872 Venous insufficiency (chronic) (peripheral): Secondary | ICD-10-CM

## 2010-12-18 LAB — URINALYSIS, ROUTINE W REFLEX MICROSCOPIC
Bilirubin Urine: NEGATIVE
Ketones, ur: NEGATIVE
Leukocytes, UA: NEGATIVE
Urine Glucose: NEGATIVE
Urobilinogen, UA: 0.2 (ref 0.0–1.0)

## 2010-12-18 NOTE — Progress Notes (Signed)
HPI: [name] here for a follow up visit... she has multiple medical problems as noted below...   ~  May 11, 2010:  she went to the ER again 7/11 w/ facial numbness& tingling- nothing found, & she decided to stop the Aricept... CT Br w/ atrophy, sm vessel dis, atherosclerotic changes, NAD.Marland Kitchen. subseq MRA for f/u sm left paraophthalmic art aneurysm was unchanged... CDopplers 8/11 showed mild calcif plaque bilat, 0-39% bilat ICA stenoses, & incidental thyroid nodule... they insisted on eval of thyroid nodule & Sonar revealed multinodular thyroid & bx of dom nodule= non-neoplastic goiter... BP remains stable on meds;  denies CP, palpit, ch in SOB, etc;  not fasting today for blood work- OK Flu shot.  ~  June 12, 2010:  she has persist/ recurrent numbness & tingling, and family notes more forgetful even after restarting the Aricept 10mg  "they won't let me drive" she says... we discussed f/u Neuro eval for these symptoms (prev eval DrWillis)... she had some right leg pain "it's from my back" & sched for epid shot tomorrow (ASA/ Plavix on hold)... Breathing is at baseline;  BP stable on meds;  Lipids have been good, thyroid stable etc...  ~  Dec 18, 2010: 44mo ROV & reasonably stable>  She saw DrRamos 11/11 for LBP w/ eval revealing multilevel DDD & she has an epid steriod injection w/ some temp benefit & she uses OTC meds for pain relief...  She had Cards f/u DrRoss 12/11 & was felt to be stable, no angina, no SOB, & no change in med Rx...  She had a Neuro eval from DrWillis 1/12 & he agreed w/ Aricept Rx & will consider adding Namenda later; he rec stopping the Amitriptyline & they feel her paresthesias are sl better off this med...  She was also treated for a UTI 3/12 by TP w/ a pansens EColi (given Cipro250 w/ resolution)...         Problem List:  Hx of ASTHMATIC BRONCHITIS (ICD-466.0) - she is a never smoker;  breathing has been good & she denies cough, sputum, dyspnea, etc...  HYPERTENSION  (ICD-401.9) - controlled on TOPROL XL 100/d,  EXFORGE 5-160 daily,  LASIX 40mg /d, KCl 19mEq/d, & IMDUR 30mg - 1/2tab/d... ~  labs 4/11 showed normal BMet... & norm labs in ER 5/11 x BS=189. ~  11/11: BP= 116/80 & similar at home- notes reduced symptoms of lightheadedness & denies HA, visual changes, CP, palipit, syncope, edema, etc... ~  5/12:  BP= 134/78, feeling OK & denies symptoms...  ARTERIOSCLEROTIC HEART DISEASE (ICD-414.00) - on IMDUR 30mg - 1/2 tab daily;  + ASA 81mg /d & PLAVIX 75mg /d...  ~  cath 1998 & 2003 w/ LAD lesion~50%, no other abn seen... ~  neg Myoview 11/08 w/o ischemia and EF=83%. ~  She follows up w/ DrRoss yearly... ~  saw DrRoss on 2/09 w/ review of 11/08 hosp for atypical CP...  doing satis without recurrent CP but too sedentary and difficulty getting around w/ her arthritis... ~  she had Cards f/u DrRoss 12/11 & was felt to be stable, no angina, no SOB, & no change in med Rx; EKG= NSR, WNL.Marland KitchenMarland Kitchen  VENOUS INSUFFICIENCY, CHRONIC (ICD-459.81) - she has chronic venous insuffic changes in her LE's w/ edema, bruising, etc;  ~  she knows to elim sodium, elevate legs, wear support hose when able, etc...  HYPERCHOLESTEROLEMIA (ICD-272.0) - on SIMVASTATIN 40mg /d & FISH OIL... ~  FLP 9/08 showed TChol 144, TG 204, HDL 34, LDL 72 ~  FLP 3/09 showed TChol 143, TG 178, HDL 33, LDL 74... rec- contin meds, incr exercise. ~  FLP 1/10 showed TChol 150, TG 162, HDL 39, LDL 79... DrRoss rec same meds, better diet. ~  FLP 7/10 showed TChol 143, TG 176, HDL 37, LDL 71 ~  FLP 4/11 showed TChol 154, TG 154, HDL 44, LDL 79 ~  FLP 3/12 showed TChol 150, TG 166, HDL 39, LDL 78  HYPOTHYROIDISM (ICD-244.9) - on LEVOTHYROID 52mcg/d... ~  labs 3/09 showed TSH= 0.98 ~  labs 7/10 showed TSH= 1.37 ~  labs 4/11 showed TSH= 1.39 ~  CDopplers 8/11 showed incidental thyroid nodule; subseq Thyroid Ultrasound showed mult bilat nodules & bx of dom lesion= non-neoplastic goiter> continue same med. ~  Labs 3/12  showed TSH= 1.16  GERD (ICD-530.81) - EGD by [facility] 6/06 showed HH, mild gastritis... ~  [facility] Aug09 w/ Carafate added (off now).  DIVERTICULOSIS OF COLON (ICD-562.10) - followed by [facility] and last colonoscopy was 4/08 showing divertics, 2 sm polyps (tubular adenomas), & hems...  CHOLELITHIASIS (ICD-574.20) - seen on prev CTAbd 4/08... s/p lap chole 6/08 by DrHoxworth...  URINARY TRACT INFECTION, CHRONIC (ICD-599.0) - mult cultures in 2009 showed sens EColi & Rx w/ Cipro... we discussed the need for Urology eval if UTI's continue to recur (for further eval). ~  5/11:  ER eval for mult symptoms and found to have a UTI- treated w/ Bacterim & resolved. ~  3/12:  Saw TP w/ UTI from a pansens EColi & Rx w/ Cipro- resolved...  DEGENERATIVE JOINT DISEASE (ICD-715.90) - she has severe DJD w/ prev bilat THR, right TKR, and left femur fx w/ pinning 2003... c/o difficulty w/ ambulation, exercise, etc... she takes Glucoamine Bid + MVI etc... ~  labs 7/10 showed Vit D level = 31... rec> start Vit D 1000 u daily. ~  11/11:  c/o right leg pain & she saw DrRamos w/ eval revealing multilevel DDD & she has an epid steriod injection w/ some temp benefit. ~  5/12: she uses OTC meds for pain relief...  TIA (ICD-435.9) & INTRACRANIAL ANEURYSM (ICD-437.3) - had left side numbness 1996- saw DrWeymann> MRI was neg... Rx w/ ASA. ~  seen by TParrett 4/09 w/ numbness & paresthesias- norm sed, B12, folate... improved w/ reassurance. 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 PLAVIX 75mg /d added. ~  ER 5/11 & 7/11> facial numbness & tingling w/ nothing found & she decided to stop the Aricept... CT Br w/ atrophy, sm vessel dis, atherosclerotic changes, NAD.Marland Kitchen. subseq MRA for f/u sm left paraophthalmic art aneurysm was unchanged... CDopplers 8/11 showed mild calcif plaque  bilat, 0-39% bilat ICA stenoses, & incidental thyroid nodule.  MEMORY LOSS (ICD-780.93) - started on ARICEPT 2/09 and sl better over time... she does her own meds w/ daughter checking on her... ~  10/11:  pt had stopped Aricept on her own & offered restart vs Neuro consult> she decided to restart. ~  She had a Neuro eval from DrWillis 1/12 & he agreed w/ Aricept Rx & will consider adding Namenda later; he rec stopping the Amitriptyline & they feel her paresthesias are sl better off this med...  ANXIETY (ICD-300.00) - on ALPRAZOLAM 0.5mg  Bid,  & AMITRIPTYLINE 25mg Qhs...  Hx of ANEMIA (ICD-285.9) - Hx of anemia in the past... Hg has remained WNL since then... ~  labs 7/10 showed Hg= 14.0 ~  labs 4/11 showed Hg= 14.7   Past Surgical History  Procedure Date  . Laparoscopic cholecystectomy 01/2007    Dr. Odie Sera  . Abdominal hysterectomy   . Bilateral thr's   . Right total knee 11/1999    Dr. Leslee Home  . Left femur fracture 04/2002    Dr. Ranell Patrick  . Lumbar fusion     Outpatient Encounter Prescriptions as of 12/18/2010  Medication Sig Dispense Refill  . ALPRAZolam (XANAX) 0.5 MG tablet Take 1/2 to 1 tablet by mouth three times daily as needed for nerves       . amLODipine-valsartan (EXFORGE) 5-160 MG per tablet Take 1 tablet by mouth daily.        Marland Kitchen aspirin 81 MG tablet Take 81 mg by mouth daily.        . clopidogrel (PLAVIX) 75 MG tablet Take 75 mg by mouth daily.        Marland Kitchen donepezil (ARICEPT) 10 MG tablet Take 10 mg by mouth at bedtime as needed.        . fish oil-omega-3 fatty acids 1000 MG capsule Take 1 g by mouth daily.        . furosemide (LASIX) 40 MG tablet Take 40 mg by mouth daily.        Marland Kitchen glucosamine-chondroitin 500-400 MG tablet Take 1 tablet by mouth daily.        . isosorbide mononitrate (IMDUR) 30 MG 24 hr tablet Take 1/2 tablet by mouth daily       . levothyroxine (SYNTHROID, LEVOTHROID) 75 MCG tablet Take 75 mcg by mouth daily.        . metoprolol (TOPROL-XL) 100 MG  24 hr tablet Take 100 mg by mouth daily.        . Multiple Vitamins-Minerals (CENTRUM SILVER PO) Take 1 tablet by mouth daily.        . potassium chloride SA (K-DUR,KLOR-CON) 20 MEQ tablet Take 20 mEq by mouth daily.        . simvastatin (ZOCOR) 40 MG tablet Take 40 mg by mouth at bedtime.        . vitamin D, CHOLECALCIFEROL, 400 UNITS tablet Take 400 Units by mouth daily.        Marland Kitchen dextromethorphan-guaiFENesin (MUCINEX DM) 30-600 MG per 12 hr tablet Take 2 tablets by mouth every 12 (twelve) hours. With plenty of fluids       . DISCONTD: ciprofloxacin (CIPRO) 250 MG tablet Take 250 mg by mouth 2 (two) times daily.          Allergies  Allergen Reactions  . Morphine     Hallucinations   . Ofloxacin     Unable to remember    REVIEW OF SYSTEMS:     See HPI - all other systems neg except as noted.    The patient complains of decreased hearing, dyspnea on exertion, muscle weakness, and difficulty walking.  The patient denies anorexia, fever, weight loss, weight gain, vision loss, hoarseness, chest pain, syncope, peripheral edema, prolonged cough, headaches, hemoptysis, abdominal pain, melena, hematochezia, severe indigestion/heartburn, hematuria, incontinence, suspicious skin lesions, transient blindness, depression, unusual weight change, abnormal bleeding, enlarged lymph nodes, and angioedema.     PHYSICAL EXAM:  WD, Overweight, [name] in NAD... GENERAL:  Alert & oriented; pleasant & cooperative... HEENT:  [place]/AT, EOM-wnl, PERRLA, EACs-clear, TMs-wnl, NOSE-clear, THROAT-clear & wnl. NECK:  Supple w/ fairROM; no JVD; normal carotid impulses w/o bruits; no thyromegaly or nodules palpated; no lymphadenopathy.  CHEST:  Clear to P & A; without wheezes/ rales/ or rhonchi heard... HEART:  Regular Rhythm; without murmurs/ rubs/ or gallops detected... ABDOMEN:  Soft & nontender; normal bowel sounds; no organomegaly or masses palpated... EXT:  mod arthritic changes; +varicose veins/ +venous  insuffic/ 1+ edema;  intact pulses in LE's... NEURO:  CN's intact; no focal neuro deficit... DERM:  No lesions noted; no rash etc...   ASSESSMENT & PLAN:  HBP>  Stable on her med regimen above; Continue the same...  ASHD>  Stable on these meds + IMDUR & her ASA/ PLAVIX;  Continue same meds...  Ven Insuffic>  Stable, at baseline w/ mild swelling esp late in the day;  Continue Rx...  CHOL>  Labs look good on Simva + Fish Oil...  Hypothy>  Stable on the Levo75...  GU>  She responded to the Cipro, wants another UA to be sure infection not coming back...  DJD/ LBP>  She had epidural steroid injection from DrRamos in the past;  Currently using OTC meds prn...  ASPVD, TIA, ANEURYSM>  See above, stable w/o cerebral ischemic symptoms  MEMORY LOSS>  On Aricept & followed by DrWillis.Marland KitchenMarland Kitchen

## 2010-12-18 NOTE — Patient Instructions (Signed)
Today we updated your med list in our EPIC system...    Continue your current meds the same...    We added ASTEPRO for the runny nose- 1 sp in each nostril up to twice dailt as needed...  Today we did your follow up urinalysis...    Please call the PH[facility]REE in a few days for your results...    Dial [id number] & when prompted enter your patient number followed by the # symbol...    Your patient number is:  [id number]#  Call for any problems...  Let's plan a follow up visit in about 6 months, sooner if the need arises.Marland KitchenMarland Kitchen

## 2010-12-19 NOTE — Op Note (Signed)
NAME:  PLACIDA, CAMBRE              [name]:  [id number]   MEDICAL RECORD NO.:  [id number]          PATIENT TYPE:  AMB   LOCATION:  SDS                          FACILITY:  [facility]   PHYSICIAN:  Sharlet Salina T. Hoxworth, M.D.DATE OF BIRTH:  06-Aug-1932   DATE OF PROCEDURE:  01/16/2007  DATE OF DISCHARGE:                               OPERATIVE REPORT   PREOPERATIVE DIAGNOSES:  1. Cholelithiasis.  2. Cholecystitis.   POSTOPERATIVE DIAGNOSES:  1. Cholelithiasis.  2. Cholecystitis.   SURGICAL PROCEDURES:  Laparoscopic cholecystectomy with intraoperative  cholangiogram.   SURGEON:  Sharlet Salina T. Hoxworth, M.D.   ANESTHESIA:  General.   BRIEF HISTORY:  Ms. Lightle is a 78 year old female with persistent  episodic right flank and subcostal abdominal pain.  She has had a  thorough workup which is all negative except for cholelithiasis.  She is  felt to likely have symptomatic gallbladder disease and laparoscopic  cholecystectomy with cholangiogram has been recommended and accepted.  The nature of procedure, indications, risks of bleeding, infection, bile  leak and bile duct injury were discussed and understood.  She is now  brought to the operating room for this procedure.   DESCRIPTION OF OPERATION:  The patient was brought to the operating room  and placed in a supine position on the operating table and general  orotracheal anesthesia was induced.  She received preoperative  antibiotics.  The abdomen was widely sterilely prepped and draped.  Correct patient and procedure were verified.  Local anesthesia was used  to infiltrate the trocar sites prior to the incisions.  A 1-cm incision  was made at the umbilicus and dissection carried down to the midline  fascia, which was sharply incised for 1 cm, and the peritoneum entered  under direct vision.  Through a mattress suture of 0 Vicryl, the Hasson  trocar was placed and pneumoperitoneum established.  Standard 4-port  technique was used  under direct vision.  There were some omental  adhesions to the gallbladder that were taken down with careful blunt  dissection.  The infundibulum was exposed and retracted inferolaterally.  Peritoneum anterior and posterior to Calot's triangle was incised and  the distal gallbladder in Calot's triangle thoroughly dissected.  The  cystic artery was clearly identified coursing up over the gallbladder  wall.  The cystic duct-gallbladder junction was dissected 360 agrees and  the cystic duct dissected out over about a centimeter.  When the anatomy  was clear, the cystic artery was doubly clipped proximally and clipped  distally and the cystic duct was clipped at the gallbladder junction.  An operative cholangiogram was then obtained through the cystic duct,  which showed good filling of the common bile duct and intrahepatic ducts  with free flow into the duodenum and no filling defects.  Following  this, the cholangiocath was removed.  The cystic was doubly clipped  proximally and divided.  The cystic artery was divided.  The gallbladder  was dissected free from its bed using hook cautery and removed through  the umbilicus.  Complete hemostasis was assured.  The abdomen was  thoroughly irrigated and  suctioned until  clear.  The trocars were removed under direct vision and  all CO2 evacuated.  Mattress suture was secured at the umbilicus.  Skin  incisions were closed with interrupted subcuticular Monocryl and  Dermabond.  Sponge, needle and instrument counts were correct.  The  patient was taken to Recovery in good condition.      Lorne Skeens. Hoxworth, M.D.  Electronically Signed     BTH/MEDQ  D:  01/16/2007  T:  01/17/2007  Job:  [id number]

## 2010-12-19 NOTE — Assessment & Plan Note (Signed)
[facility]                            CARDIOLOGY OFFICE NOTE   Andrea Mccoy, Andrea Mccoy                     MRN:          [id number]  DATE:06-20-2009                            DOB:          Nov 19, 1931    IDENTIFICATION:  Andrea Mccoy is a 77 year old woman with a history of  moderate CAD by cath in 2003.  Also, history of hypertension,  dyslipidemia, and TIAs.  She was last seen in April of last year.   In the interval, she has been doing okay.  She had a spell in August.  I  had the ER report where she was feeling weak.  She is not sure what  happened.  She was sitting in a chair.  She might fall in sleep.  She  might blacked out.  No chest pain.  No shortness of breath with this.  No dizziness prior.  Has not had any other recurrent spell.  It looks  like workup was unremarkable.  She is also followed by Dr. Kriste Basque and  seen back in October.   She comes in today.  She denies chest pain.  Breathing is okay.  She  does activities as she wants no change.  She is not that active.  She  did not take her medicines, because she is due to have fasting labs  today.   CURRENT MEDICATIONS:  1. Klor-Con 20 daily.  2. Lasix 40.  3. Isorbid 15.  4. Alprazolam 0.5 b.i.d.  5. Amitriptyline 25 nightly.  6. Centrum Silver.  7. Aspirin 81.  8. Simvastatin 40.  9. Aricept 10.  10.Synthroid 75 mcg.  11.Metoprolol 100.  12.Omega-3 1 g b.i.d.  13.Glucosamine daily.   PHYSICAL EXAMINATION:  GENERAL:  On exam, the patient is in no distress.  VITAL SIGNS:  Blood pressure lying 155/86 and pulse 64, sitting 177/87  and pulse 66, standing at 0 minutes 173/94 and pulse 69, at 2 minutes  161/94 and pulse 62, and at 5 minutes 149/86 and pulse 63.  Asymptomatic  throughout.  LUNGS:  Clear.  NECK:  No JVD or bruits.  CARDIAC:  Regular rate and rhythm.  S1 and S2.  No S3.  No significant  murmurs.  ABDOMEN:  Obese.  EXTREMITIES:  1+ edema, chronic.   A 12-lead EKG  normal sinus rhythm, 64 beats per minute.   IMPRESSION:  1. Coronary artery disease.  It appears to be stable.  We would      continue on medical therapy.  2. Dyslipidemia.  Check fasting lipids today.  3. Hypertension, needs to be back on her medicines.  On Dr. Jodelle Green      office, the blood pressure is under control on last visit.  4. Question spell.  I am not sure what this was.  I would keep an eye      and I would not plan any aggressive workup for right now.  She had      a nonischemic Myoview a year ago.  I am not convinced her symptoms      are changed.  Left ventricular function is  normal.   I will set to see the patient back in the winter, sooner if problems  develop.     Pricilla Riffle, MD, [facility]  Electronically Signed    PVR/MedQ  DD: 05-27-202010  DT: 08/21/2008  Job #: [id number]   cc:   Lonzo Cloud. Kriste Basque, MD

## 2010-12-19 NOTE — H&P (Signed)
[name], Andrea Mccoy              [name]:  [id number]   MEDICAL RECORD NO.:  [id number]           PATIENT TYPE:   LOCATION:                                 FACILITY:   PHYSICIAN:  Bevelyn Buckles. Bensimhon, MDDATE OF BIRTH:  [birth year]   DATE OF ADMISSION:  DATE OF DISCHARGE:                              HISTORY & PHYSICAL   CARDIOLOGIST:  Pricilla Riffle, MD, [facility]   PRIMARY CARE PHYSICIAN:  Lonzo Cloud. Kriste Basque, MD   CHIEF COMPLAINT:  Right-sided chest pressure.   HISTORY OF PRESENT ILLNESS:  This is a 77 year old female with a history  of nonobstructive CAD, hypertension, and hypothyroidism who comes in  complaining of intermittent mild right-sided chest pressure since  yesterday.  The chest pressure began initially when she was crocheting  and quickly went away.  The patient went on to have several episodes  throughout the night and one episode that woke her up this morning.  The  pressure was mild-to-moderate in intensity; not associated with any  nausea, vomiting, or diaphoresis.  The pressure did not radiate.  She  did not taking anything for the pressure including nitroglycerin.   PAST MEDICAL HISTORY:  1. Prior catheterization in 2003 showing a 50% LAD lesion, and no      changes since her prior cath in 1998. Other vessels were clear.  2. Hypertension.  3. Hypothyroidism.  4. Hiatal hernia.  5. Hypercholesterolemia.  6. Status post bilateral total hip arthroplasties.  7. History of lumbar fusion.  8. History of right total knee replacement.  9. History of cholecystectomy.  10.History of hysterectomy.  11.Depression with anxiety component.   ALLERGIES:  OFLOXACIN, which causes hallucination.   MEDICATIONS:  1. Levothyroxine 25 mcg per day.  2. Aspirin 81 mg p.o. per day.  3. Lasix 40 mg p.o. daily.  4. Zocor 40 mg p.o. daily.  5. Amitriptyline 25 mg p.o. daily.  6. Xanax 0.5 mg 3 times daily as needed.  7. KCL 20 mEq p.o. daily.  8. Metoprolol 50 mg ER daily.  9.  Multivitamin.  10.Isordil 15 mg ER daily.   SOCIAL HISTORY:  The patient lives in [place] alone.  She does not have a  smoking history.  She does not drink alcohol.  She exercises 1 time a  week, usually on a treadmill.   FAMILY HISTORY:  Mother died of coronary artery disease in her 57s.  Father died of cirrhosis in his 37s.   REVIEW OF SYSTEMS:  CONSTITUTIONAL:  No fevers, chills, or night sweats.  HEENT:  No recent lightheadedness.  Recent decrease vision in left eye  due to cataract.  No recent headaches.  CARDIOPULMONARY:  Recent chest  pressure.  No shortness of breath.  No peripheral edema.  No  palpitations, syncope, or coughing recently.  GU:  No dysuria or  hematuria or urgency.  NEUROLOGIC/PSYCHIATRIC:  Generalized weakness has  been worsening over the last year.  No numbness, depression, or anxiety.  GI:  No nausea, vomiting, or diarrhea recently.  No change in bowel  habits.  No melena or hematochezia.   PHYSICAL  EXAMINATION:  VITAL SIGNS:  Temperature 97.4, pulse 87,  respirations 20, blood pressure 160/93 that improved to 149/92, and  sating 98% on 2 L.  GENERAL:  No acute distress, recumbent, and appearing comfortable.  HEENT:  Pupils equally round and reactive to light.  Extraocular eye  movements intact.  Sclerae clear.  Mild erythema in the oropharynx with  no exudates or swelling.  NECK:  Supple without lymphadenopathy.  No JVD.  CARDIOVASCULAR:  Heart has a regular rate and rhythm.  S1 and S2 are  normal.  No murmurs, rubs, or gallops heard on exam.  A +2 radial pulses  bilaterally.  Dorsalis pedis pulses are nonpalpable bilaterally.  Chest  pressure is reproducible by palpation of the right chest.  LUNGS:  Clear to auscultation bilaterally.  No accessory muscle use.  ABDOMEN:  Soft, nontender, and nondistended.  No hepatosplenomegaly.  EXTREMITIES:  No clubbing, cyanosis, or edema.  The patient has a right  lower extremity focal area swelling secondary to a  recent injury.  The  area of swelling is nontender with some light bruising.  NEUROLOGIC:  Alert and oriented x3.  Cranial nerves II through XII  grossly intact.  Muscles strength +5 in all extremities.   EKG: Rate, 69.  Rhythm, normal sinus rhythm.  Axis normal.  PR interval  0.16.  Q-wave 0.08.  QTc 398.  There are no ischemic changes, and no  left ventricular hypertrophy.   LABORATORY DATA:  Hemoglobin 14.5, white count 5.9, platelets 216,000,  sodium 139, potassium 4.4, chloride 105, BUN 18, creatinine 1.1, and a  glucose of 121.  Point-of-care cardiac markers:  CK 101, MB less than  1.0, and troponin less than 0.05.   ASSESSMENT AND PLAN:  This is a 77 year old female with nonobstructive  coronary artery disease seen in the ER for 24 hours of intermittent  right-sided mild chest pressure.  1. Chest pressure.  The differential for the patient's chest pressure      could include musculoskeletal pain, gastroesophageal reflux      disease, or unstable angina.  The patient's chest history was      negative.  The patient's EKG and point-of-care cardiac enzymes were      also negative.  The patient's chest pressure is actually      reproducible on exam.  We feel comfortable letting the patient go      home and having her follow up with our cardiology clinic for a      stress test tomorrow.  We recommend bedrest at home until then and      informed the patient that she should not take her Metoprolol 50 mg      extended release today or tomorrow before the stress test.  The      patient has an outpatient appointment for a Myoview on June 27, 2007, at 11:30 a.m.  2. Hypertension.  The patient's blood pressure improved from 160/93 to      149/92 during her stay in the ER.  Her hypertension at this point      seems mild.  We feel comfortable sending her home overnight and      having her follow up with Dr. Tenny Craw and her primary care physician      Dr. Kriste Basque in regards to her high  blood pressure.      Lollie Sails, MD  Electronically Signed      Bevelyn Buckles. Bensimhon, MD  Electronically Signed  CB/MEDQ  D:  06/26/2007  T:  06/26/2007  Job:  [id number]   cc:   Pricilla Riffle, MD, [facility]

## 2010-12-19 NOTE — Assessment & Plan Note (Signed)
[facility]                            CARDIOLOGY OFFICE NOTE   Andrea Mccoy, Andrea Mccoy                     MRN:          [id number]  DATE:09/08/2007                            DOB:          May 26, 1932    IDENTIFICATION:  The patient is a 77 year old woman with a history of  moderate CAD by catheterization (50% LAD in 2003).  I last saw her in  2006.   In the interval she actually got admitted for chest pain back in  November.  Was an atypical pain.  Right-sided.  She was a little  hypertensive that time. She ruled out and had a Myoview scan done on  June 27, 2007 which showed normal perfusion LVEF of greater than  70%.   In the interval she has done okay from a cardiac standpoint.  No chest  complaints.  No shortness of breath.  She has, since I have seen her,  had her gallbladder out.   The patient was seen most recently by Rubye Oaks, NP.  I do not have  the records of this because it is in electronic record.  Her Toprol was  increased to 2 tablets of 50 mg each because of high blood pressure.  She is tolerating it okay.  Also she was switched back to simvastatin in  the fall.  Her current medications include Klor-Con 20, Synthroid 0.75,  Lasix 40, Isorbid 15 daily, Xanax 0.5 b.i.d., amitriptyline 25 q.h.s.,  Centrum Silver, aspirin 81, simvastatin 40, and Toprol XL 100.   PHYSICAL EXAM:  The patient is in no distress.  Blood pressure is 128/80 pulse is 66 and regular weight 189.  NECK:  No bruits.  Lungs are clear.  No rales or wheezes.  Cardiac exam regular rate and rhythm, S1-S2 no murmurs.  ABDOMEN:  Benign.  Well-healed site for a cholecystectomy.  EXTREMITIES:  Lite edema.  No other edema.  2+ pulses.   12-lead EKG sinus rhythm 63 beats per minute, motion artifact.   IMPRESSION:  1. Coronary artery disease, moderate by cath several years ago.      Normal Myoview asymptomatic.  Encourage to stay active.  2. Dyslipidemia.  Have  her get a fasting lipid panel as well as an AST      at her convenience.   Otherwise I will set to see her back in 12 months and sooner if problems  develop.     Pricilla Riffle, MD, [facility]  Electronically Signed    PVR/MedQ  DD: 09/08/2007  DT: 09/08/2007  Job #: [id number]

## 2010-12-22 ENCOUNTER — Other Ambulatory Visit: Payer: Self-pay | Admitting: *Deleted

## 2010-12-22 MED ORDER — ALPRAZOLAM 0.5 MG PO TABS: 0.5000 mg | ORAL_TABLET | Freq: Three times a day (TID) | ORAL | Status: DC | PRN

## 2010-12-22 NOTE — Discharge Summary (Signed)
[name], Andrea Mccoy                     [name]:  [id number]   MEDICAL RECORD NO.:  [id number]                   PATIENT TYPE:  INP   LOCATION:  5022                                 FACILITY:  [facility]   PHYSICIAN:  Almedia Balls. Ranell Patrick, M.D.              DATE OF BIRTH:  March 31, 1932   DATE OF ADMISSION:  04/12/2002  DATE OF DISCHARGE:  04/20/2002                                 DISCHARGE SUMMARY   ADMITTING DIAGNOSES:  1. Left supracondylar comminuted fracture of the left distal femur.  2. Hypertension.  3. Hypercholesterolemia.  4. Hypothyroidism.   DISCHARGE DIAGNOSES:  1. Left supracondylar comminuted fracture of the left distal femur.  2. Hypertension.  3. Hypercholesterolemia.  4. Hypothyroidism.  5. Coronary artery disease.  6. Postoperative anemia.   CONSULTATIONS:  1. Dr. Granville Lewis. Bensimhon.  2. Dr. Salvadore Farber.  3. Rehabilitation medicine.   OPERATION:  On April 14, 2002, the patient underwent closed  intramedullary nailing of supracondylar femoral fracture retrograde; Dr.  Illene Labrador. [place] and Roma Schanz, PA assisted.   BRIEF HISTORY:  This 77 year old lady seen by [place] in the past and has  undergone bilateral total hip replacement, as well as right total knee  replacement by Dr. Chilton Greathouse.  She fell on September 7 with her leg up  underneath her.  She was unable to ambulate and was brought to the emergency  room, where it was noted she had a supracondylar femoral fracture.  The  fracture, fortunately, was below the stem of her previously done total hip.  She was seen in [facility] Emergency Room but, due to her severe cardiac  history, it was decided she would be best handled at [facility]  She underwent cardiac catheterization on September 8 for cardiac clearance  for the surgical procedure.  Once this was done, she underwent the above  procedure.   HOSPITAL COURSE:  The patient tolerated the surgical procedure quite  well.  She had a mild amount of confusion postoperatively from the anesthetics, as  well as the pain medication.  The family was in close attendance throughout  her hospitalization.  [name], Dr. Tenny Craw, followed the patient  postoperatively.  She had no cardiac incidents.  She did have a drop in  hemoglobin postoperatively to 8.4.  She was transfused and her final  hemoglobin was 10.9, hematocrit was 31.5.   She was entered into physical therapy and she progressed very slowly,  particularly in light of the fact that she could only perform toe touch on  her operative side.  She was mainly transfers with some ambulation in the  room.  We asked for a consult from rehabilitation medicine and they felt  that she would be best served for her level of activity and ambulation to be  in the subacute care unit and arrangements were made for that transfer.  On  the day of discharge, the patient  was stable, vital signs were stable, the  wound was dry.  She was switched to aspirin for DVT prophylaxis.   LABORATORY DATA:  Hematologically showed a CBC preoperatively with an RBC of  3.5, hemoglobin of 7.6, hematocrit was 33.7.  Final hemoglobin was 10.9 with  a hematocrit of 31.5.  Blood chemistries were essentially normal.  She had  some mild hypokalemia.  CK showed no indication of myocardial injury.  Lipids were 145, triglycerides 155.   Chest x-ray showed no active lung disease.  Electrocardiogram showed  normal sinus rhythm with low-voltage QRS.   CONDITION ON DISCHARGE:  Improved and stable.   PLAN:  The patient is transferred to [facility] Unit to continue  with her postoperative rehabilitation.  Recommend she continue with her  medications that were in-hospital and any change in them should be under the  direction of [place] cardiologist.  Analgesics used p.r.n.  Continue toe  touch weightbearing on the operative side and dry dressing p.r.n.  Staples  may be removed two  weeks after date of surgery.     Dooley L. Shela Nevin, P.A.             Almedia Balls. Ranell Patrick, M.D.    DLU/MEDQ  D:  04/20/2002  T:  04/20/2002  Job:  [id number]   cc:   Pricilla Riffle, M.D. [facility]

## 2010-12-22 NOTE — Cardiovascular Report (Signed)
   [name], Andrea Mccoy                     [name]:  [id number]   MEDICAL RECORD NO.:  [id number]                   PATIENT TYPE:  INP   LOCATION:  5022                                 FACILITY:  [facility]   PHYSICIAN:  Salvadore Farber, M.D. [facility]         DATE OF BIRTH:  [birth year]   DATE OF PROCEDURE:  04/13/2002  DATE OF DISCHARGE:                              CARDIAC CATHETERIZATION   PROCEDURES:  Coronary angiography, left heart catheterization, left  ventriculography.   INDICATIONS:  Stable angina, known left anterior descending stenosis,  preoperative for pinning of left hip fracture.   DIAGNOSTIC TECHNIQUE:  Informed consent was obtained. Under 2% lidocaine  local anesthesia, a 6 French sheath was placed in the right femoral artery  using the modified Seldinger technique. Coronary angiography was performed  using a JL4 and JR4 catheters. A 6 French pigtail catheter was advanced in  the left ventricle.  Pressures were measured. Ventriculography was performed  by power injection. Following the procedure, the sheaths were removed.   COMPLICATIONS:  None.   FINDINGS:  1. Left main:  Angiographically normal.  2. LAD:  There is a 50% stenosis at the midportion of the LAD between the     midportion of the LAD between the two large diagonal branches.  3. Circumflex:  The circumflex is a large vessel giving rise to a single     large obtuse marginal branch. The vessel is angiographically normal.  4. RCA:  Dominant vessel.  This is angiographically normal.  5. LV:  Ventricular ectopy during ventriculography precludes assessment of     ejection fraction and mitral regurgitation. LVEDP 22 after the     administration of contrast.  6. No aortic stenosis on pullback.   IMPRESSION/RECOMMENDATIONS:  The patient has stable moderate left anterior  descending stenosis, which is unchanged compared to the report of March 29, 1997.  She needs no further cardiac evaluation before  proceeding with the  orthopedic operative plan for pinning of her left hip. With the moderate  left anterior descending stenosis, beta blocker should be continued  throughout the perioperative period.                                                 Salvadore Farber, M.D. [facility]    WED/MEDQ  D:  04/13/2002  T:  04/14/2002  Job:  [id number]   cc:   Lonzo Cloud. Kriste Basque, M.D. [facility]

## 2010-12-22 NOTE — Telephone Encounter (Signed)
Pt seen 12/18/2010. Pls advise on Alprazolam refills to [facility] for 90 day supply.

## 2010-12-22 NOTE — Discharge Summary (Signed)
[facility]  Patient:    Andrea Mccoy, Andrea Mccoy                  MRN: [id number] Adm. Date:  [id number] Disc. Date: [id number] Attending:  Marlowe Kays Page Dictator:   Ralene Bathe, P.A. CC:         Pulmonary CCU                           Discharge Summary  ADMISSION DIAGNOSES: 1. End-stage osteoarthritis, right knee. 2. Hypertension. 3. Hypothyroidism. 4. Hiatal hernia. 5. Coronary artery disease.  DISCHARGE DIAGNOSES: 1. End-stage osteoarthritis, right knee. 2. Hypertension. 3. Hypothyroidism. 4. Hiatal hernia. 5. Coronary artery disease. 6. Postoperative oversedation/obtundation secondary to morphine use. 7. Postoperative hemorrhagic anemia requiring transfusion without sequelae.  CONSULTANTS: 1. Danice Goltz, M.D., pulmonologist. 2. Faith Rogue, M.D., rehabilitation.  OPERATION:  Right total knee arthroplasty, surgeon -- Dr. Illene Labrador. Aplington, assistant -- Dr. Georges Lynch. Gioffre, under general anesthesia.  BRIEF HISTORY:  This is a 77 year old female with end-stage osteoarthritis affecting the the right knee and has failed conservative outpatient therapies and now wishes to proceed with total joint replacement.  Risks and benefits were discussed with the patient at length and patient was in agreement and wished to proceed.  HOSPITAL COURSE:  Patient was admitted and underwent the above-named procedure and tolerated this well.  All appropriate IV antibiotics and analgesics were provided.  Postoperative day #1, Hemovac was discontinued without complications.  She was placed on total knee replacement protocol with partial weightbearing and on Coumadin for DVT and PE prophylaxis.  Her morphine PCA was continued through postoperative day #2; however, patient had complications on November 14, 1999 with oversedation and hypoxemia and obtundation.  A pulmonary CCU consult was ordered to assist.  She was felt to have been overmedicated with  morphine and all narcotics were withdrawn over the next 24 hours.  She had good results and was much more alert, awake and had no more episodes of respiratory distress.  CCU pulmonary team continued to follow patient through hospital course and patient remained stable.  On November 15, 1999, postoperative day #2, she was also noted to have hemoglobin of 8.3 and she was transfused two units of autologous blood without complications.  Her ABGs were much improved after being off of the PCA morphine.  She was able to return to working with physical therapy.  A rehab consult was ordered; however, patient desired discharge to home instead.  She worked with the therapist and the goal was for home disposition and on November 18, 1999, postoperative day #5, she was doing extremely well, she was afebrile, vital signs were stable and O2 saturations were stable and her knee incision was dry without drainage.  She had passed all physical therapy goals and was stable for discharge to home.  LABORATORY AND X-RAY FINDINGS:  ABGs were ordered on the 10th and on the 11th. Initial ABG:  PO2 of 56.9; the second one is improved at 71.5.  Hemoglobin on admission 11.2; postoperatively, 8.3; after transfusion 10.9.  Chemistries within normal limits except for mild elevated glucose.  Pro time and INR were followed by pharmacy, on Coumadin.  Urinalysis showed trace urobilinogen; otherwise, normal.  Blood type showed A-positive.  EKG showed normal sinus rhythm, occasional PVCs on admission.  The second one was on the 10th, which showed sinus tachycardia.  Chest x-ray I do not find at time of dictation.  CONDITION ON DISCHARGE:  Stable and improved.  DISCHARGE MEDICATIONS AND PLANS:  Patient is being discharged to home in care of her family.  Continue partial weightbearing to the operative extremity. Daily dressing changes p.r.n.  Resume regular diet and home medications. [facility] for physical therapy and  home R.N.  May shower at this time.  Call for followup appointment at two weeks postoperatively.  She was given a prescriptions for Vicodin one to two every four to six hours p.r.n. pain and Coumadin per pharmacy. DD:  12/29/99 TD:  12/31/99 Job: [id number]

## 2010-12-22 NOTE — H&P (Signed)
[name], Andrea Mccoy                     [name]:  [id number]   MEDICAL RECORD NO.:  [id number]                   PATIENT TYPE:  EMS   LOCATION:  ED                                   FACILITY:  [facility]   PHYSICIAN:  Almedia Balls. Ranell Patrick, M.D.              DATE OF BIRTH:  05/16/1928   DATE OF ADMISSION:  04/12/2002  DATE OF DISCHARGE:                                HISTORY & PHYSICAL   CHIEF COMPLAINT:  Left leg pain.   HISTORY OF PRESENT ILLNESS:  The patient is a pleasant 77 year old female  who was mopping her floor around 1 p.m. earlier today.  She slipped and  fell, and her left leg went out to the side and behind her when she fell.  She was subsequently brought to [facility] by  ambulance, where it was noted that she had a comminuted supracondylar distal  femur fracture on the left.   PAST MEDICAL HISTORY:  1. Hypertension.  2. Hypercholesterolemia.  3. Hypothyroid.   PAST SURGICAL HISTORY:  1. Bilateral total hip arthroplasties.  2. Lumbar fusion.  3. Right total knee arthroplasty.   MEDICATIONS:  1. Dilacor XR 240 mg 1 p.o. q.d.  2. Furosemide 40 mg 1 p.o. q.a.m.  3. Zocor 10 mg 1 p.o. q.p.m.  4. Toprol XL 50 mg 1/2 p.o. q.d.  5. Amitriptyline 25 mg 2 p.o. q.p.m.  6. Synthroid 0.15 mg 1/2 p.o. q.d.  7. Alprazolam 0.5 mg 1/2-1 p.o. q.i.d.  8. Isosorbide MN 30 mg 1/2 p.o. q.d.  9. Premarin 0.625 mg 1 p.o. q.d.  10.      Aspirin 325 mg 1 p.o. q.d.  11.      Plavix __________  mg 1 p.o. q.d.   SOCIAL HISTORY:  The patient denies any tobacco or alcohol use.  She is  married.  She lives in a [place] house.  Has three steps to enter.  She is  accompanied to the emergency department by her son and daughter.   FAMILY HISTORY:  Her mother had an MI, mother deceased from MI.  Father  deceased, liver abscess.   REVIEW OF SYSTEMS:  GENERAL:  Denies fevers, chills, night sweats, bleeding  tendencies.  CNS:  Denies blurry or double vision,  seizures, headaches,  paralysis.  RESPIRATORY:  Positive shortness of breath.  Denies productive  cough, hemoptysis.  CARDIOVASCULAR:  Positive chest pain with radiation to  left arm.  Denies orthopnea.  GASTROINTESTINAL:  Positive palpitations.  Denies nausea, vomiting, diarrhea, melena, bloody stools.  GENITOURINARY:  Denies dysuria, hematuria, discharge.  MUSCULOSKELETAL:  As pertinent to  HPI.   PHYSICAL EXAMINATION:  VITAL SIGNS:  Temperature 97.2, pulse 76,  respirations 16, blood pressure 138/75.  GENERAL:  Well-developed, well-nourished 77 year old female.  NECK:  Supple.  No carotid bruit noted.  CHEST:  Clear to auscultation bilaterally.  No wheezes or crackles.  HEART:  Regular rate and rhythm.  Without murmurs, rubs, or gallops.  ABDOMEN:  Soft, nontender, nondistended.  Positive bowel sounds x 4.  EXTREMITIES:  Mild to moderate swelling of the left knee, and neurovascular  to the left lower extremity is intact distally.  Left thigh, left knee warm  to touch.  Has a good dorsalis pedis pulse.  SKIN:  No rashes or lesions.  NEUROLOGIC:  Alert and oriented x 3.   LABORATORY DATA:  X-ray revealed supracondylar comminuted fracture of the  left distal femur.   IMPRESSION:  1. Left supracondylar comminuted fracture of the left distal femur.  2. Hypertension.  3. Hypercholesterolemia.  4. Hypothyroid.   PLAN:  The patient will be [facility] to [facility] and undergo  retrograde IM nail to the left femur pending cardiology clearance, which has  been called.  The surgery could possibly be done on April 12, 2002, or  possibly at a later date over the next two days.     Clarene Reamer, P.A.-C.                   Almedia Balls. Ranell Patrick, M.D.    SW/MEDQ  D:  04/12/2002  T:  04/12/2002  Job:  [id number]

## 2010-12-22 NOTE — Discharge Summary (Signed)
[name], Andrea Mccoy                          [name]:  [id number]   MEDICAL RECORD NO.:  [id number]                   PATIENT TYPE:   LOCATION:                                       FACILITY:  [facility]   PHYSICIAN:  Daniel L. Thomasena Edis, M.D.             DATE OF BIRTH:   DATE OF ADMISSION:  04/20/2002  DATE OF DISCHARGE:  05/01/2002                                 DISCHARGE SUMMARY   DISCHARGE DIAGNOSES:  1. Left supracondylar comminuted fracture on the left distal femur, status     post intramedullary nailing.  2. History of cardiovascular disease.  3. Hypokalemia.  4. History of hypertension.  5. Status post urinary tract infection.  6. History of thyroid disease.  7. History of hyperlipidemia.   HISTORY OF PRESENT ILLNESS:  The patient is a 77 year old white female  admitted on April 12, 2002 after fall with loss of consciousness,  sustaining a left suprcondylar comminuted femur fracture, preoperative  cardiac clearance and cardiac catheterization secondary to chest tightness  on April 13, 2002 per Dr. Salvadore Farber which showed a moderate LAD  stenosis; she was cleared for surgery.  The patient underwent a retrograde  IM nailing on April 13, 2002 by Dr. Almedia Balls. Norris.  PT report at this  time indicated the patient is toe-touchdown weightbearing, could transfers  moderate assist, bed mobility, moderate assist.  Postoperative complications  include decreased blood pressure with mental status changes secondary to  narcotics, anemia and hypokalemia.  The patient is presently on no DVT  prophylaxis, only aspirin 325 mg p.o. q.d.   PAST MEDICAL HISTORY:  Past medical history is significant for  cardiovascular disease/angina, TIA, hypertension, increased lipids,  hypothyroidism and MVP.   PAST SURGICAL HISTORY:  Past surgical history is significant for bilateral  total hip replacement, right total knee replacement and back fusion in 1989.   ALLERGIES:   Allergies to CIPROFLOXACIN.   FAMILY HISTORY:  Noncontributory.   PRIMARY CARE Aneli Zara:  Dr. Lonzo Cloud. Nadel.   CARDIOLOGIST:  Dr. Pricilla Riffle.   SOCIAL HISTORY:  The patient lives with husband in [place] and she was  independent prior to admission.  They live in a one-level home with three  steps to entry.  Her husband is legally blind and daughter works during the  day, with no significant family to assist at discharge.   REVIEW OF SYSTEMS:  Review of systems is significant for joint swelling in  ankles and constipation.   HOSPITAL COURSE:  The patient was admitted to [facility] department  where she received at least an hour and a half of physical therapy daily.  Overall, she made fair progress in rehab.  She was discharged at close  supervision level, ambulating approximately 25 feet.  Her hospital course  was significant for the following:  #1 - Urinary tract infection, #2 -  hypokalemia, #3 - mild hypotension.  Throughout her stay in [facility], the  patient remained on Lovenox 30 mg subcu. q.12h. for DVT prophylaxis.  She  had venous Dopplers performed on April 21, 2002 which demonstrated no  obvious evidence of deep or superficial thrombus or Baker's cyst.  The  patient has continued to have struggle with her weightbearing precautions;  she is touch-down weightbearing on the left lower extremity.  She has had a  hard time maintaining that, therefore, she has decided not to put any weight  on the left leg and does hop using her right leg for ambulation.  Overall,  she was able to tolerate therapies very well.   Admission labs demonstrate the patient had a hemoglobin of 10.8, potassium  level of 4.4.  She remained on a multivitamins with minerals throughout her  entire stay.  The patient remained on K-Dur 20 mEq p.o. b.i.d. because she  was taking Lasix 40 mg p.o. every day.  She remained on Synthroid 75 mcg  p.o. every day for hypothyroidism.  Latest TSH was performed on  April 21, 2002 and was 3.978.  On April 20, 2002, the patient developed a  urinary tract infection; she had greater than 100,000 colonies of E. coli in  her urine, therefore, she was started on Macrodantin 50 mg p.o. q.i.d. for a  total of seven days.  Blood pressure remained fairly under control while in  rehab.  Due to history of hypertension prior to admission to [facility], the  patient remained on Lasix as well as Toprol 50 mg p.o. q.d. and diltiazem  240 mg, placed on hold during time spent in rehab.  Pain was being  controlled with Vicodin and Tylenol.  No adjustment was necessary in pain  medications.  On April 24, 2002, staples were removed from her left leg  and Steri-Strips were applied.  The patient did developed a mild abrasion on  her buttocks; skin care was managed by R.N.  The patient had Restore on her  right buttock.  There were no other major medical issues that occurred while  in [facility].   Latest labs indicated that she had four Hemoccults performed on her stool;  they were all negative.  The latest hemoglobin was 10.8, hematocrit 31.7,  white blood count was 8.4, platelet count 306,000.  Latest potassium level  was 4.4, sodium was 138, chloride 106, CO2 25, glucose 118, BUN 20,  creatinine 0.9, AST 27, ALT was 16, alkaline phosphatase 166.  At time of  discharge, all vitals were stable; blood pressure was 115/72, respiratory  rate was 20, pulse was 88, temperature was 97.7.  Left leg incision was well-  healed.  Steri-Strips were applied.  Mild edema.  Rest of physical exam was  unremarkable.  PT report indicated that the patient continued her touch-down  weightbearing status.  She was ambulating approximately 25 feet with close  supervision with rolling walker, could transfer sit-to-stand with close  supervision, bed mobility at supervision level.  She can perform most ADLs  with supervision to modified independent.  The patient was discharged home with  environment with family care.   DISCHARGE MEDICATIONS:  1. Aspirin 325 mg daily.  2. Zocor 10 mg every other day.  3. Toprol 50 mg daily.  4. Elavil 25 mg two tabs in the evening.  5. Synthroid 150 mcg one-half tablet daily.  6. Xanax 0.25 mg four times daily.  7. Dilacor XR 240 mg -- she is to hold for now, to follow up  with Dr. Tenny Craw.  8. Lasix 40 mg daily.  9. Premarin 0.625 mg daily.  10.      Plavix -- to resume 75 mg daily.  11.      Isosorbide -- do not take, hold for now, to follow up with Dr.     Dietrich Pates.  12.      Potassium -- K-Dur 20 mEq daily.  13.      She is to take Vicodin one to two tabs every four to six hours as     needed for pain.  14.      Tylenol as needed.   ACTIVITY:  She is to maintain her toe-touch-down weightbearing, use walker,  use wheelchair.  She is to have [facility] for physical  therapy and occupational therapy.   DIET:  Diet restriction includes low cholesterol.   FOLLOWUP:  She is to follow up with Dr. Dietrich Pates, November 3rd at 9:15,  and to call the office.  She is to follow up with orthopedist, Dr. Ranell Patrick,  within 10 to 14 days, call for appointment.  She is to follow up with Dr.  Reuel Boom L. Collins as needed.      Junie Bame, P.A.                       Daniel L. Thomasena Edis, M.D.    LH/MEDQ  D:  05/01/2002  T:  05/04/2002  Job:  [id number]   cc:   Almedia Balls. Ranell Patrick, M.D.  [address]  [place]  [place]  [address]  Fax: [phone]   Pricilla Riffle, M.D. [facility]

## 2010-12-22 NOTE — Op Note (Signed)
[name], Andrea Mccoy                     [name]:  [id number]   MEDICAL RECORD NO.:  [id number]                   PATIENT TYPE:  INP   LOCATION:  5022                                 FACILITY:  [facility]   PHYSICIAN:  Almedia Balls. Ranell Patrick, M.D.              DATE OF BIRTH:  1931-08-09   DATE OF PROCEDURE:  04/14/2002  DATE OF DISCHARGE:                                 OPERATIVE REPORT   PREOPERATIVE DIAGNOSES:  Left distal femur supracondylar fracture.   POSTOPERATIVE DIAGNOSES:  Left distal femur supracondylar fracture.   OPERATION PERFORMED:  Closed intramedullary nailing of supracondylar femur  fracture, retrograde.   SURGEON:  Almedia Balls. Ranell Patrick, M.D.   ASSISTANT:  1. Illene Labrador. Aplington, M.D.  2. Roma Schanz, [place]   ANESTHESIA:  General.   ESTIMATED BLOOD LOSS:  Minimal.   FLUIDS REPLACED:  1200 cc crystalloid.   INSTRUMENT COUNT:  Correct.   COMPLICATIONS:  None.   URINE OUTPUT:  350 cc.   INDICATIONS FOR PROCEDURE:  The patient is a 77 year old female who is a  patient of Dr. Rosanne Ashing Aplington status post bilateral total hip replacement and  right total knee replacement who fell on April 12, 2002.  The patient  presented complaining of immediate pain and deformity in her left leg and  inability to ambulate.  She presented to [facility]  emergency room where x-rays demonstrated a displaced supracondylar femur  fracture.  The patient was noted to have a femoral stem from her total hip  replacement extending down to the junction of the mid and proximal third of  the femur.  This fracture was approximately 10 to 12 cm below this stem.  The patient upon presenting to the emergency room reported a cardiac history  as well as current symptoms.  Thus it was decided based on consultation with  cardiology to transfer the patient to [facility] for cardiac  catheterization which was performed on April 13, 2002.  After clearance  by  cardiology after cardiac catheterization, the patient was cleared for  surgery to stabilize her distal femur.  Discussion with the patient's family  of possible risks and complications was done.  All questions were encouraged  and answered.  Informed consent was obtained for surgery.   DESCRIPTION OF PROCEDURE:  After an adequate level of anesthesia was  achieved and 1 gm of Ancef was given preoperatively, the patient was  positioned in the supine position on a radiolucent fracture table.  The left  leg was prepped and draped in its entirety in the usual sterile fashion.  A  small longitudinal skin incision was created, starting at the distal pole of  the patella extending towards the tibial tubercle.  This was developed using  a Bovie electrocautery.  Dissection was carried down to the peritenon which  was incised in line with the skin incision.  The patellar tendon was then  split in its  midline exposing retropatellar fat.  This was excised exposing  the femoral notch.  At this point the knee was flexed utilizing C-arm  fluoroscopy.  A guide pin was used for the step cut drill to open the distal  femur.  Once this guide pin location was confirmed.  The step cut reamer was  used to overdrill and make an entry portal into the distal femur.  This was  a 12 mm step cut drill.  At this point a ball tip guidewire was inserted  across the fracture site and utilized to ream up to a size 13.5 reamer.  This would accept a 12 mm diameter nail.  Size was selected at 25 cm.  At  this point the retrograde femoral nail by DePuy Ace was inserted in a  retrograde fashion under C-arm fluoroscopy across the fracture site with  traction on the femur to obtain the best alignment possible.  With continued  traction and outrigger jig, interlocking screws were placed from lateral to  medial in a bicortical fashion gaining excellent fixation of the distal  fragment and the proximal femur.  At this point multiple  view C-arm were  obtained to ensure adequate reduction and hardware placement.  Once this was  confirmed, the knee was thoroughly irrigated.  The patellar tendon was  reapproximated using 0 Vicryl followed by 2-0 staples for the skin. Sterile  dressings applied followed by a knee immobilizer.  The patient tolerated  surgery well and was taken to the recovery room in stable condition.                                                 Almedia Balls. Ranell Patrick, M.D.    SRN/MEDQ  D:  04/16/2002  T:  04/16/2002  Job:  [id number]

## 2010-12-22 NOTE — Telephone Encounter (Signed)
Ok for refills to [facility] for refills of alprazolam.

## 2010-12-22 NOTE — Consult Note (Signed)
[name], Andrea Mccoy                     [name]:  [id number]   MEDICAL RECORD NO.:  [id number]                   PATIENT TYPE:  INP   LOCATION:  5022                                 FACILITY:  [facility]   PHYSICIAN:  Almedia Balls. Ranell Patrick, M.D.              DATE OF BIRTH:  Mar 13, 1932   DATE OF CONSULTATION:  04/13/2002  DATE OF DISCHARGE:                                   CONSULTATION   PRIMARY CARDIOLOGIST:  Pricilla Riffle, M.D.   REASON FOR CONSULTATION:  Cardiology clearance for femur fracture repair.   HISTORY OF PRESENT ILLNESS:  The patient is a 77 year old white female with  a history of coronary artery disease, hypertension, and hyperlipidemia who  presented to [facility] following a fall that resulted in a left  supracondylar fracture.  She states that she slipped on a step and had no  presyncope or syncope.  She had no chest discomfort, dyspnea, or  palpitations.  For the past two months, however, she has had intermittent,  4/10, dull chest pain that radiates to her left upper extremity.  She states  this pain occurs spontaneously and is occasionally relieved with  nitroglycerin.  She has dyspnea, though this is not always associated with  her chest pain.  She has had presyncope, though the last episode was greater  than one month ago.  She denies orthopnea or PND.  She does admit to lower  extremity edema.   PAST MEDICAL HISTORY:  1. Coronary artery disease.  The patient had a cardiac catheterization     approximately five years ago showing a 50% blockage.  2. Questionable transient ischemic attacks.  3. Hypertension.  4. Hyperlipidemia.  5. Hypothyroidism.   SOCIAL HISTORY:  She lives in [place] with her husband.  She denies  tobacco or alcohol.   FAMILY HISTORY:  Significant for a mother who died of a myocardial  infarction in her 65's.   REVIEW OF SYMPTOMS:  Positive for chest pain, shortness of breath, dyspnea  on exertion, edema, and  presyncope.  She is having none of those symptoms at  this time, however.  She has minimal pain from her left femur fracture.   ALLERGIES:  She states that FLUOROQUINOLONES cause delirium.   MEDICATIONS:  1. Aspirin 325 mg q.d.  2. Plavix 75 mg q.d.  3. Zocor 10 mg q.o.d.  4. Lasix 40 mg q.d.  5. Toprol XL 50 mg q.d.  6. Dilacor 240 mg q.d.  7. Synthroid 0.075 mg q.d.  8. Elavil 50 mg q.h.s.  9. Alprazolam 0.25 mg q.i.d.  10.      Imdur 15 mg q.d.  11.      Premarin 0.625 mg q.d.   PHYSICAL EXAMINATION:  VITAL SIGNS:  Temperature 97.2, blood pressure  138/55, pulse 78, respiratory rate 16, saturation 98% on room air.  GENERAL:  She is comfortable and in no acute distress.  HEENT:  Unremarkable.  NECK:  Negative for bruits, JVD, or thyromegaly.  LUNGS:  Clear.  CARDIOVASCULAR:  Regular rate and rhythm without murmurs, rubs, or gallops.  SKIN:  Unremarkable.  RECTAL:  Deferred.  EXTREMITIES:  Revealed some tenderness at the site of her left distal femur  fracture.  She had 1 to 2+ edema bilaterally, and she had strong distal  pulses.  NEUROLOGIC:  Nonfocal.   LABORATORY DATA:  Chest x-ray was unavailable.  Her ECG revealed normal  sinus rhythm without evidence of ischemia.  Her labs:  White blood cell  count 11.6, hemoglobin 13.2, hematocrit 37.8, platelets 195.  Sodium 140,  potassium 3.3, chloride 104, bicarbonate 28, BUN 23, creatinine 1.2, glucose  138.  GI panel was normal.  Albumin was 3.9.   ASSESSMENT AND PLAN:  Left supracondylar fracture with a two month history  of chest discomfort/subacute coronary syndrome.  I suspect that the risk of  surgery is moderate at best.  My index of suspicion for significant coronary  disease is relatively high, however.  The patient has been having chest pain  that is relieved with nitroglycerin for the past two months.  Her symptoms  are not completely typical of cardiac chest pain, but she is known to have  at least some coronary  disease based on a catheterization done five years  ago.  I recommend continuing the aspirin and the beta blocker throughout her  perioperative course.  It would be preferable to define her coronary anatomy  with coronary angiography.  Should she need revascularization, either  percutaneous or surgical, we will coordinate this with you.  It is  reasonable to hold Plavix for now, however, she should receive adequate deep  venous thrombosis prophylaxis.      [name], M.D. [facility]             [employer]. Ranell Patrick, M.D.    DRB/MEDQ  D:  04/13/2002  T:  04/13/2002  Job:  [id number]

## 2010-12-22 NOTE — Telephone Encounter (Signed)
RX called to [facility].

## 2010-12-22 NOTE — Discharge Summary (Signed)
[name], Andrea Mccoy              [name]:  [id number]   MEDICAL RECORD NO.:  [id number]          PATIENT TYPE:  INP   LOCATION:  5151                         FACILITY:  [facility]   PHYSICIAN:  Lonzo Cloud. Kriste Basque, M.D. [facility] OF BIRTH:  06-28-1932   DATE OF ADMISSION:  04/25/2004  DATE OF DISCHARGE:  04/27/2004                                 DISCHARGE SUMMARY   FINAL DIAGNOSIS:  1.  Admitted April 26, 2004, via the emergency room  with acute      bronchitis characterized by cough, yellow sputum production, fever,      chills, weakness, and marked constitutional symptoms.  The patient      responded to intravenous fluids and Avelox.  2.  Known nonobstructive coronary artery disease with previous      catheterization in 2003 showing 50% left anterior descending one vessel      disease treated with Toprol, Imdur, Lasix, and potassium.  She had a      Cardiolite scheduled as an outpatient after a recent follow up with Dr.      Dietrich Pates and this will be rescheduled.  3.  History of transient ischemic attack on aspirin and Plavix.  4.  History of hypertension controlled on medications.  5.  History of hypercholesterolemia controlled on Zocor.  6.  History of hypothyroidism controlled on Synthroid.  7.  History of urinary tract infections in the past.  8.  History of degenerative arthritis with bilateral total hip replacements,      right total knee replacement, and previous back fusion.  Most recently,      she had a left femur fracture which was nailed in 2003 by Dr. Beverely Low.  9.  History of chronic anemia with hemoglobins around 11.  10. History of anxiety.  11. History of allergy to morphine and Floxin.   BRIEF HISTORY:  The patient is a 77 year old white female known to me with  multiple medical problems as listed above.  She presented to the emergency  room on April 25, 2004, with a one day history of fever to 102 degrees  at home, chills, and generally feeling  poorly.  She was quite weak and felt  that she just could not make it at home.  She complained of mild cough  which was productive of some beige sputum.  She had dyspnea with minimal  activity.  She noted some chest discomfort described as a constant burning  sensation in her chest with soreness.  She rated it 5 out of 10 without  radiation.  She noted nausea and vomiting on one occasion.  She was seen in  the emergency room  with these symptoms by Dr. Freida Busman of the ER staff.  He  noted her weakness, dyspnea, and chest discomfort.  He performed the usual  ER protocol including CPKs which were negative.  She was referred for  admission for bronchitic exacerbation with chest discomfort.   PAST MEDICAL HISTORY:  Past history includes known coronary disease with  nonobstructive disease on catheterization in 2003 with a 50% LAD one  vessel  lesion.  She recently had some chest discomfort and was evaluated by Dr.  Dietrich Pates.  She scheduled her for an outpatient Cardiolite which was to be  done on April 27, 2004.  She has been on Toprol, Imdur, Lasix, and  potassium.  She  has a history of mild hypertension also controlled with  these medications.  She has a history of a TIA in the past and takes aspirin  and Plavix.  She has a history of hypercholesterolemia and her Zocor was  recently increased from 10 to 20 mg at bedtime.  She has a history of  hypothyroidism controlled on Synthroid.  She has a history of urinary tract  infections in the past.  She has venous insufficiency with peripheral edema  for which she takes the Lasix.  She has a history of severe arthritis with  previous bilateral total hip replacements, right total knee replacement, and  a back fusion in 1989.  Most recently, she had a fall with a left femur  fracture which was nailed in 2003 by Dr. Ranell Patrick.  She has a remote history  of colon polyps, as well.  She has moderate anxiety and takes Xanax as  needed.   PHYSICAL  EXAMINATION:  77 year old white female chronically ill appearing in  no acute distress.  Vital signs:  Blood pressure 148/74, pulse 106 and  regular, respirations 20 per minute and not labored, temperature 99.3  orally.  HEENT exam was unremarkable.  She has slightly dry mucous membranes  and wears glasses.  Neck exam shows no jugular venous distention, no carotid  bruits, no thyromegaly, no lymphadenopathy.  Respiratory exam revealed the  lungs to be clear to auscultation and percussion except for a few bibasilar  rhonchi noted.  There were some forced expiratory wheezes but no signs of  consolidation.  Cardiovascular exam revealed a regular rhythm, grade 1/6  systolic murmur at the left sternal border with an S4 gallop, no rubs, no S3  noted.  Gastrointestinal exam revealed the abdomen to be soft with minimal  epigastric discomfort on palpation.  Bowel sounds were intact without  evidence of organomegaly or masses.  Rectal deferred.  Extremities showed  some venous insufficiency and trace edema.  She had evidence of degenerative  arthritis, previous bilateral total hips, and right total knee surgery.  Neurological exam was without focal abnormalities detected.  Dermatologic  exam was negative.   LABORATORY DATA:  Chest x-ray done in the ER by Dr. Freida Busman showed no acute  changes.  EKG showed normal sinus rhythm, nonspecific ST-T wave changes, no  acute abnormalities.  Hemoglobin 14.2, hematocrit 41, white count 10,000,  with 96% segs.  Sed rate 2.  Protime 13.1, INR 1, PTT 23.  Sodium 140,  potassium 3.4, chloride 107, CO2 24, BUN 26, creatinine 1.3, blood sugar  109, calcium 9, total protein 6.6, albumin 3.7, AST 40, ALT 21, alkaline  phos 25, total bilirubin 0.6, lipase 38, TSH 1.14.  Blood cultures negative.  CPK negative with negative MB and negative troponin in the ER protocol.   HOSPITAL COURSE:  The patient was admitted with acute bronchitis and chest discomfort.  She had marked  constitutional symptoms with weakness and felt  she could not make it at home.  She was given IV fluids and started on IV  Avelox.  She responded nicely to this regimen.  She was covered on the DVT  prophylaxis protocol with Lovenox.  On the second hospital day, she  felt  much better, was up ambulating in the hall and felt she had gathered her  strength back.  She had only minimal cough with some beige sputum  production.  Mucinex aided in mucociliary clearance.  She was given an  Advair inhaler here in the hospital.  She felt she needed early discharge to  get home and take care of her sickly husband.  We decided it was safe to  discharge her on oral Avelox.   DISCHARGE MEDICATIONS:  1.  Avelox 400 mg p.o. daily for five more days until gone.  2.  Mucinex 600 mg p.o. b.i.d. with plenty of fluids.  3.  Zocor 20 mg p.o. q.h.s.  4.  Aspirin 325 mg p.o. daily.  5.  Plavix 75 mg p.o. daily.  6.  Toprol XL 50 1 tablet p.o. daily.  7.  Lasix 40 mg p.o. daily.  8.  K-Dur 20, 1 tablet p.o. daily.  9.  Imdur 1 tablet p.o. daily.  10. Protonix 30 mg p.o. daily.  11. Xanax 0.25 mg p.o. b.i.d.  12. Amitriptyline 25 mg p.o. q.h.s.  13. Darvocet N100, 1 tablet every 4-6 hours as needed for pain.   DISCHARGE INSTRUCTIONS:  She was instructed on a low salt, low sodium diet,  and instructed to rest at home.  She will use her Advair inhaler 250/50 1  puff b.i.d. until it is gone.  Follow up in the office on Thursday,  September 29, at 12 noon with chest x-ray.  At that time, we will reschedule  her outpatient Cardiolite procedure.   CONDITION ON DISCHARGE:  Improved.       SMN/MEDQ  D:  04/27/2004  T:  04/28/2004  Job:  [id number]

## 2010-12-22 NOTE — Op Note (Signed)
[name], Andrea Mccoy                        [name]:  [id number]   MEDICAL RECORD NO.:  [id number]                   PATIENT TYPE:  AMB   LOCATION:  ENDO                                 [facility]ACILITY[facility]  [facility]   PHYSICIAN:  Petra Kuba, M.D.                 DATE OF BIRTH:  September 24, 1931   DATE OF PROCEDURE:  03/30/2003  DATE OF DISCHARGE:                                 OPERATIVE REPORT   PROCEDURE:  Colonoscopy.   INDICATION:  The patient with a history of colon polyps, due for a repeat  screening.  Consent was signed after risks, benefits, methods, options  thoroughly discussed in the office multiple times in the past.   MEDICINES USED:  1. Demerol 50.  2. Versed 6.   DESCRIPTION OF PROCEDURE:  Rectal inspection was pertinent for external  hemorrhoids.  Digital exam was negative.  Video colonoscope was inserted,  easily advanced around the colon to the cecum.  This did not require any  abdominal pressure or any position changes.  No obvious abnormality was seen  on insertion.  The cecum was identified by the appendiceal orifice and the  ileocecal valve.  The scope was slowly withdrawn.  The prep was adequate.  There was some liquid stool that required washing and suctioning.  On slow  withdrawal through the colon, questionable tiny ascending polyp was seen and  was hot biopsied x 1.  The scope was further withdrawn.  An occasional left-  sided diverticula was seen.  There was a questionable polyp on one of the  folds which was cold biopsied in the descending and then another tiny distal  sigmoid polyp probably hyperplastic which was hot biopsied as well.  Once  back in the rectum, anorectal pull-through and retroflexion confirmed some  hemorrhoids.  Scope was reinserted a short ways up the left side of the  colon; air was suctioned, scope removed.  The patient tolerated the  procedure well.  There was no obvious immediate complication.   ENDOSCOPIC DIAGNOSES:  1. Internal  and external hemorrhoids.  2. Occasional left-sided diverticula.  3. Three polyps with hot biopsies in the sigmoid and ascending and cold     biopsy in the descending.  4. Otherwise, within normal limits to the cecum.   PLAN:  1. Await pathology, probably recheck colon screening in five years if doing     well medically.  2. Happy to see back p.r.n.  3. Otherwise, return care to [facility]. Kriste Basque, M.D. for the customary health     care maintenance to include yearly rectals and guaiacs.                                               Petra Kuba, M.D.   MEM/MEDQ  D:  03/30/2003  T:  03/30/2003  Job:  [id number]   cc:   Lonzo Cloud. Kriste Basque, M.D. [facility]

## 2010-12-27 ENCOUNTER — Inpatient Hospital Stay (HOSPITAL_COMMUNITY)
Admission: EM | Admit: 2010-12-27 | Discharge: 2010-12-29 | DRG: 312 | Disposition: A | Discharge status: Home Health | Payer: Medicare HMO | Attending: Internal Medicine | Admitting: Internal Medicine

## 2010-12-27 ENCOUNTER — Emergency Department (HOSPITAL_COMMUNITY): Payer: Medicare HMO

## 2010-12-27 DIAGNOSIS — Z8673 Personal history of transient ischemic attack (TIA), and cerebral infarction without residual deficits: Secondary | ICD-10-CM

## 2010-12-27 DIAGNOSIS — Z96649 Presence of unspecified artificial hip joint: Secondary | ICD-10-CM

## 2010-12-27 DIAGNOSIS — E039 Hypothyroidism, unspecified: Secondary | ICD-10-CM | POA: Diagnosis present

## 2010-12-27 DIAGNOSIS — G250 Essential tremor: Secondary | ICD-10-CM | POA: Diagnosis not present

## 2010-12-27 DIAGNOSIS — I059 Rheumatic mitral valve disease, unspecified: Secondary | ICD-10-CM | POA: Diagnosis present

## 2010-12-27 DIAGNOSIS — Z96659 Presence of unspecified artificial knee joint: Secondary | ICD-10-CM

## 2010-12-27 DIAGNOSIS — E7800 Pure hypercholesterolemia, unspecified: Secondary | ICD-10-CM | POA: Diagnosis present

## 2010-12-27 DIAGNOSIS — F068 Other specified mental disorders due to known physiological condition: Secondary | ICD-10-CM | POA: Diagnosis present

## 2010-12-27 DIAGNOSIS — Z79899 Other long term (current) drug therapy: Secondary | ICD-10-CM

## 2010-12-27 DIAGNOSIS — T43205A Adverse effect of unspecified antidepressants, initial encounter: Secondary | ICD-10-CM | POA: Diagnosis not present

## 2010-12-27 DIAGNOSIS — Z7902 Long term (current) use of antithrombotics/antiplatelets: Secondary | ICD-10-CM

## 2010-12-27 DIAGNOSIS — I2510 Atherosclerotic heart disease of native coronary artery without angina pectoris: Secondary | ICD-10-CM | POA: Diagnosis present

## 2010-12-27 DIAGNOSIS — R55 Syncope and collapse: Principal | ICD-10-CM | POA: Diagnosis present

## 2010-12-27 DIAGNOSIS — I10 Essential (primary) hypertension: Secondary | ICD-10-CM | POA: Diagnosis present

## 2010-12-27 DIAGNOSIS — F29 Unspecified psychosis not due to a substance or known physiological condition: Secondary | ICD-10-CM | POA: Diagnosis not present

## 2010-12-27 DIAGNOSIS — Y9210 Unspecified residential institution as the place of occurrence of the external cause: Secondary | ICD-10-CM | POA: Diagnosis not present

## 2010-12-27 DIAGNOSIS — Z7982 Long term (current) use of aspirin: Secondary | ICD-10-CM

## 2010-12-27 DIAGNOSIS — I872 Venous insufficiency (chronic) (peripheral): Secondary | ICD-10-CM | POA: Diagnosis present

## 2010-12-27 DIAGNOSIS — G4700 Insomnia, unspecified: Secondary | ICD-10-CM | POA: Diagnosis not present

## 2010-12-27 LAB — CBC
MCHC: 33.2 g/dL (ref 30.0–36.0)
RDW: 12.5 % (ref 11.5–15.5)

## 2010-12-27 LAB — POCT CARDIAC MARKERS
CKMB, poc: <1 ng/mL — ABNORMAL LOW (ref 1.0–8.0)
Myoglobin, poc: 52.7 ng/mL (ref 12–200)

## 2010-12-27 LAB — BASIC METABOLIC PANEL
BUN: 21 mg/dL (ref 6–23)
Creatinine, Ser: 1.15 mg/dL (ref 0.4–1.2)
GFR calc non Af Amer: 46 mL/min — ABNORMAL LOW (ref >60)

## 2010-12-27 LAB — GLUCOSE, CAPILLARY: Glucose-Capillary: 113 mg/dL — ABNORMAL HIGH (ref 70–99)

## 2010-12-27 LAB — DIFFERENTIAL
Basophils Absolute: 0 10*3/uL (ref 0.0–0.1)
Basophils Relative: 0 % (ref 0–1)
Eosinophils Absolute: 0.1 10*3/uL (ref 0.0–0.7)
Neutro Abs: 3.9 10*3/uL (ref 1.7–7.7)
Neutrophils Relative %: 61 % (ref 43–77)

## 2010-12-27 LAB — CARDIAC PANEL(CRET KIN+CKTOT+MB+TROPI): Troponin I: <0.3 ng/mL (ref <0.30)

## 2010-12-27 NOTE — H&P (Signed)
[name], Andrea Mccoy              [name]:  [id number]  MEDICAL RECORD NO.:  [id number]           PATIENT TYPE:  E  LOCATION:  WLED                         FACILITY:  [facility]  PHYSICIAN:  Talmage Nap, MD  DATE OF BIRTH:  March 02, 1932  DATE OF ADMISSION:  12/27/2010 DATE OF DISCHARGE:                             HISTORY & PHYSICAL   PRIMARY PULMONOLOGIST:  Lonzo Cloud. Kriste Basque, M.D.  History obtainable from patient and patient's daughter.  CHIEF COMPLAINT:  Patient passed out while visiting the relative on sixth floor in [facility] at about 2:30 p.m. today which is Dec 27, 2010.  HISTORY OF PRESENT ILLNESS:  Patient is a 77 year old very pleasant Caucasian female with history of coronary artery disease, mitral valve prolapse and hypertension, who was said to have been in good health until about 2:30 p.m. today while visiting a relative who had just had a knee replacement at [facility] on the sixth floor.  All of a sudden patient complained about feeling dizzy and then subsequently passed out for a very brief period of time approximately about a minute or two.  She was said to have been incontinent of urine, but not incontinent of feces.  There was no generalized tonic-clonic seizures. There was no history of fever.  There was no history of chills.  There is no history of rigor.  There is no chest pain or shortness of breath. Post passing out, patient was said to have been awake and subsequently brought to the Emergency Room to be evaluated.  PAST MEDICAL HISTORY:  Positive for: 1. Hypertension. 2. Coronary artery disease. 3. Dementia. 4. Hypercholesterolemia. 5. Hypothyroidism. 6. Mitral valve prolapse. 7. Multiple TIA-no neuro-deficit  PAST SURGICAL HISTORY: 1. Lower back surgery. 2. Bilateral hip replacement. 3. Bilateral knee replacement. 4. Hysterectomy. 5. Nodulectomy from the thyroid gland.  PREADMISSION MEDICATIONS:  Include: 1.  Isosorbide mononitrate XR 30 mg half a tablet p.o. q.a.m. 2. Aricept (donepezil) 10 mg one p.o. daily. 3. Vitamin D3 400 units one p.o. daily. 4. Omega-3 acid 1 gram one p.o. b.i.d. 5. Simvastatin 40 mg one p.o. daily. 6. Metoprolol XL succinate 100 mg one p.o. daily. 7. Systane eyedrops OU 1 drop p.r.n. 8. Silver centrum 1 tablet p.o. q.h.s. 9. Cosamin DS over-the-counter one p.o. daily. 10.Aspirin 81 mg p.o. daily. 11.Furosemide 40 mg one p.o. q.a.m. 12.Levothyroxine (Synthroid) dose unknown. 13.Potassium chloride 20 mEq one p.o. daily. 14.Exforge (amlodipine/valsartan) 5/160 one p.o. q.a.m. 15.Plavix (clopidogrel) 75 mg p.o. daily. 16.Alprazolam 0.5 mg one p.o. b.i.d.  ALLERGIES: 1. MOXIFLOXACIN. 2. MORPHINE.  SOCIAL HISTORY:  Negative for alcohol, tobacco use.  Patient is a member of [employer].  FAMILY HISTORY:  Family history is said to be positive for coronary artery disease.  REVIEW OF SYSTEMS:  Patient denies any history of headaches.  No blurred vision.  No nausea or vomiting.  No fever.  No chills.  No rigor.  No chest pain or shortness of breath.  Denies any cough.  No abdominal discomfort.  No diarrhea or hematochezia.  No dysuria or hematuria.  Has periodic swelling of the lower extremities.  No intolerance to  heat or cold and no neuropsychiatric disorder.  LABORATORY DATA:  Initial chemistry shows sodium of 440, potassium of 4.1, chloride of 102 with the bicarb of 29, glucose is 103, BUN is 21, creatinine is 1.15.  Hematological indices showed WBC of 6.4, hemoglobin of 14.4, hematocrit of 43.4, MCV of 97.3 with the platelet count of 202,000, normal differentials.  First set of cardiac markers, troponin I less than 0.05 and CK-MB is less than 1.0.  Fecal occult blood test is negative.  DIAGNOSTIC STUDIES:  EKG was said to have showed sinus bradycardia with nonspecific ST-wave changes.  CT of the brain not done.  IMPRESSION: 1. Syncope:  Rule out seizure  disorder. 2. Coronary artery disease. 3. History of mitral valve prolapse. 4. Hypertension. 5. Hypercholesterolemia. 6. Hypothyroidism. 7. Dementia. 8. Chronic venostasis.  PLAN:  Plan is to admit patient to telemetry.  Patient will be saline locked.  She will be given Lasix 40 mg IV q.12h., aspirin 81 mg p.o. daily.  Other medication to be given to the patient will include isosorbide mononitrate 50 mg p.o. daily, Zocor 40 mg p.o. daily, Lopressor 50 mg p.o. b.i.d.  She will also be on Plavix 75 mg p.o. daily, Exforge 5/160 one p.o. daily and KCl 20 mEq p.o. daily.  She will also be restarted on Aricept 10 mg p.o. daily.  GI prophylaxis will be done on Protonix 40 mg p.o. daily and DVT prophylaxis with Lovenox 40 mg subcutaneous q.24h.  Further labs to be ordered on this patient will include cardiac enzymes q.6h. x3; thyroid panel, which will include TSH, T3 and T4; CBC; CMP and magnesium will be repeated in a.m..  Imaging studies to be done will include: 1. CT brain without contrast stat. 2. Carotid duplex. 3. 2-D echo. 4. EEG. Patient will be reevaluated with imaging and lab studies and she will be followed and a daily basis.Talmage Nap, MD     CN/MEDQ  D:  12/27/2010  T:  12/27/2010  Job:  [id number]  Electronically Signed by Talmage Nap  on 12/27/2010 10:46:52 PM

## 2010-12-28 ENCOUNTER — Inpatient Hospital Stay (HOSPITAL_COMMUNITY)
Admit: 2010-12-28 | Discharge: 2010-12-28 | Disposition: A | Discharge status: Home / Self Care | Payer: Medicare HMO | Attending: Internal Medicine | Admitting: Internal Medicine

## 2010-12-28 LAB — COMPREHENSIVE METABOLIC PANEL
ALT: 15 U/L (ref 0–35)
AST: 25 U/L (ref 0–37)
Albumin: 3.5 g/dL (ref 3.5–5.2)
Alkaline Phosphatase: 82 U/L (ref 39–117)
BUN: 18 mg/dL (ref 6–23)
Chloride: 101 mEq/L (ref 96–112)
Potassium: 3.8 mEq/L (ref 3.5–5.1)
Sodium: 138 mEq/L (ref 135–145)
Total Bilirubin: 0.4 mg/dL (ref 0.3–1.2)

## 2010-12-28 LAB — CBC
MCH: 32.6 pg (ref 26.0–34.0)
MCV: 96 fL (ref 78.0–100.0)
Platelets: 181 10*3/uL (ref 150–400)
RBC: 4.27 MIL/uL (ref 3.87–5.11)

## 2010-12-28 LAB — DIFFERENTIAL
Eosinophils Absolute: 0.2 10*3/uL (ref 0.0–0.7)
Eosinophils Relative: 2 % (ref 0–5)
Lymphs Abs: 2.1 10*3/uL (ref 0.7–4.0)
Monocytes Relative: 10 % (ref 3–12)
Neutrophils Relative %: 57 % (ref 43–77)

## 2010-12-28 LAB — CARDIAC PANEL(CRET KIN+CKTOT+MB+TROPI): CK, MB: 1.3 ng/mL (ref 0.3–4.0)

## 2010-12-28 LAB — TSH: TSH: 0.735 u[IU]/mL (ref 0.350–4.500)

## 2010-12-29 ENCOUNTER — Telehealth: Payer: Self-pay | Admitting: Pulmonary Disease

## 2010-12-29 LAB — CBC
HCT: 45.2 % (ref 36.0–46.0)
Hemoglobin: 14.7 g/dL (ref 12.0–15.0)
MCH: 31.7 pg (ref 26.0–34.0)
MCV: 97.4 fL (ref 78.0–100.0)
RBC: 4.64 MIL/uL (ref 3.87–5.11)

## 2010-12-29 LAB — BASIC METABOLIC PANEL
BUN: 18 mg/dL (ref 6–23)
CO2: 27 mEq/L (ref 19–32)
Chloride: 104 mEq/L (ref 96–112)
Creatinine, Ser: 0.92 mg/dL (ref 0.4–1.2)
Glucose, Bld: 125 mg/dL — ABNORMAL HIGH (ref 70–99)
Potassium: 4.3 mEq/L (ref 3.5–5.1)

## 2010-12-29 NOTE — Telephone Encounter (Signed)
Spoke with pt's daughter and sched appt with SN for HFU on 01/04/11 at 4 pm okay per [place].

## 2010-12-29 NOTE — Procedures (Signed)
EEG NUMBER:  HISTORY:  This is a 77 year old female with syncope evaluated to rule out seizure.  MEDICATIONS:  Lasix, aspirin, Aricept, Zocor, Lopressor, Plavix, potassium, Protonix, Lovenox, Benicar, Norvasc, and Imdur.  CONDITION OF RECORDING:  This is a 16-channel EEG carried on the patient in the awake and drowsy states.  DISCUSSION:  The waking background activity consists of a low-voltage, symmetrical, fairly well-organized 10-Hz alpha activity seen from the parietooccipital and posterior temporal regions.  Low-voltage, fast activity, poorly organized was seen anteriorly and at times superimposed on more posterior rhythms.  A mixture of theta and alpha rhythm was seen from the central and temporal regions.  The patient drowses with slowing to irregular which is theta and beta activity.  Stage II sleep was not obtained.  Hypoventilation and intermittent photic stimulation were not performed.  The patient goes into a light sleep with symmetrical sleep spindles, vertex with sharp activity and irregular slow activity.  IMPRESSION:  This is a normal EEG.          ______________________________ Thana Farr, MD    ZO:XWRU D:  12/28/2010 17:57:13  T:  12/29/2010 01:15:01  Job #:  [id number]

## 2010-12-30 NOTE — H&P (Signed)
  [name], Andrea Mccoy              [name]:  [id number]  MEDICAL RECORD NO.:  [id number]           PATIENT TYPE:  I  LOCATION:  1514                         FACILITY:  [facility]  PHYSICIAN:  Talmage Nap, MD  DATE OF BIRTH:  Apr 06, 1932  DATE OF ADMISSION:  12/27/2010 DATE OF DISCHARGE:                             HISTORY & PHYSICAL   ADDENDUM:  PRIMARY PULMONOLOGIST:  Lonzo Cloud. Kriste Basque, MD  History was obtainable from the patient and the patient's daughter.  PHYSICAL EXAMINATION:  GENERAL:  When patient was seen by me,  she was not in any respiratory distress.  She was well hydrated. VITAL SIGNS:  Blood pressure 116/54, pulse 52, respiratory rate 21, temperature 97.6. HEENT:  Pupils are reactive to light, and extraocular muscles are intact. NECK:  No jugular venous distention.  No carotid bruit.  No lymphadenopathy. CHEST:  Clear to auscultation. HEART:  Sounds are 1 and 2. ABDOMEN:  Soft, nontender.  Liver, spleen, kidney not palpable.  Bowel sounds are positive. EXTREMITIES:  No pedal edema. NEUROLOGIC:  Exam did not show any neuro lateralizing sign. SKIN:  Slightly decreased turgor. MUSCULOSKELETAL:  Arthritic changes in the knees and feet.  Please for laboratory data, impression and plan, refer to my initial history and physical dictated on Dec 27, 2010.     Talmage Nap, MD     CN/MEDQ  D:  12/29/2010  T:  12/29/2010  Job:  [id number]  Electronically Signed by Talmage Nap  on 12/30/2010 01:57:07 PM

## 2011-01-02 ENCOUNTER — Telehealth: Payer: Self-pay | Admitting: Pulmonary Disease

## 2011-01-02 NOTE — Telephone Encounter (Signed)
Spoke to daughter and she states the pt is dizzy with standing and the nurse at the house states pt b/p is 98/52, spoke to Stefania Goulart parrett and she states have the pt to not take her exforge on Wednesday and to take 1/2 of her exforge on Thursday and the pt has post hosp with sn and we will address the b/p issues then daughter also aware if systolic is 100 or less on Thursday pt should skip her exforge then also--daughter aware and verbalized understanding and i had the daughter read the above directions back to me before we hung up

## 2011-01-04 ENCOUNTER — Ambulatory Visit (INDEPENDENT_AMBULATORY_CARE_PROVIDER_SITE_OTHER): Payer: Medicare HMO | Admitting: Pulmonary Disease

## 2011-01-04 DIAGNOSIS — I10 Essential (primary) hypertension: Secondary | ICD-10-CM

## 2011-01-04 DIAGNOSIS — M1990 Unspecified osteoarthritis, unspecified site: Secondary | ICD-10-CM

## 2011-01-04 DIAGNOSIS — F411 Generalized anxiety disorder: Secondary | ICD-10-CM

## 2011-01-04 DIAGNOSIS — R413 Other amnesia: Secondary | ICD-10-CM

## 2011-01-04 DIAGNOSIS — E7800 Pure hypercholesterolemia, unspecified: Secondary | ICD-10-CM

## 2011-01-04 DIAGNOSIS — E039 Hypothyroidism, unspecified: Secondary | ICD-10-CM

## 2011-01-04 DIAGNOSIS — G459 Transient cerebral ischemic attack, unspecified: Secondary | ICD-10-CM

## 2011-01-04 DIAGNOSIS — I2510 Atherosclerotic heart disease of native coronary artery without angina pectoris: Secondary | ICD-10-CM

## 2011-01-04 DIAGNOSIS — R55 Syncope and collapse: Secondary | ICD-10-CM

## 2011-01-04 DIAGNOSIS — I872 Venous insufficiency (chronic) (peripheral): Secondary | ICD-10-CM

## 2011-01-04 MED ORDER — MOMETASONE FUROATE 50 MCG/ACT NA SUSP: 2.0000 | Freq: Every day | NASAL | Status: DC

## 2011-01-04 NOTE — Patient Instructions (Signed)
Today we updated your med list in EPIC...    We decided to DECREASE both your Exforge & your Toprol XL (Metoprolol) to 1/2 of each tab daily...  Please monitor your BP carefully at home 7 record your results...  Call for any questions...  Let's plan a follow up recheck in about 3 weeks.Marland KitchenMarland Kitchen

## 2011-01-04 NOTE — Progress Notes (Signed)
HPI  [name] here for a follow up visit... she has multiple medical problems as noted below...   ~  May 11, 2010:  she went to the ER again 7/11 w/ facial numbness& tingling- nothing found, & she decided to stop the Aricept... CT Br w/ atrophy, sm vessel dis, atherosclerotic changes, NAD.Marland Kitchen. subseq MRA for f/u sm left paraophthalmic art aneurysm was unchanged... CDopplers 8/11 showed mild calcif plaque bilat, 0-39% bilat ICA stenoses, & incidental thyroid nodule... they insisted on eval of thyroid nodule & Sonar revealed multinodular thyroid & bx of dom nodule= non-neoplastic goiter... BP remains stable on meds;  denies CP, palpit, ch in SOB, etc;  not fasting today for blood work- OK Flu shot.  ~  June 12, 2010:  she has persist/ recurrent numbness & tingling, and family notes more forgetful even after restarting the Aricept 10mg  "they won't let me drive" she says... we discussed f/u Neuro eval for these symptoms (prev eval DrWillis)... she had some right leg pain "it's from my back" & sched for epid shot tomorrow (ASA/ Plavix on hold)... Breathing is at baseline;  BP stable on meds;  Lipids have been good, thyroid stable etc...  ~  Dec 18, 2010: 46mo ROV & reasonably stable>  She saw DrRamos 11/11 for LBP w/ eval revealing multilevel DDD & she has an epid steriod injection w/ some temp benefit & she uses OTC meds for pain relief...  She had Cards f/u DrRoss 12/11 & was felt to be stable, no angina, no SOB, & no change in med Rx...  She had a Neuro eval from DrWillis 1/12 & he agreed w/ Aricept Rx & will consider adding Namenda later; he rec stopping the Amitriptyline & they feel her paresthesias are sl better off this med...  She was also treated for a UTI 3/12 by TP w/ a pansens EColi (given Cipro250 w/ resolution)...  ~  Jan 04, 2011:  2 week ROV & post-Hosp check> she was visiting a relative in the hosp 12/27/10 when she felt light headed & fainted briefly (fell back into a chair) then  confused- taken to ER w/ syncope & Adm for 2d> CT Br showed atrophy, sm vessel dis, vasc calcif, NAD;  EEG was WNL;  CDopplers were neg x for mild plaque- no extracranial carotid dis;  EKG (in ER) w/ [id number], NSSTTWA;  2DEcho showed normal LVF (55-60%) & no wall motion abn, essent norm valves etc;  Labs were norm as well... Disch on same meds> since disch she's been sl weak & BP at home sl low (home health said ~100/50 & we decr her Exforge)...  We decided to cut back slightly on her BP Rx & monitor BP closely at home> rec to take 1/2 of the Exforge & 1/2 of the ToprolXL.         Problem List:  Hx of ASTHMATIC BRONCHITIS (ICD-466.0) - she is a never smoker;  breathing has been good & she denies cough, sputum, dyspnea, etc...  HYPERTENSION (ICD-401.9) - on TOPROL XL 100/d,  EXFORGE 5-160 daily,  LASIX 40mg /d, KCl 69mEq/d, & IMDUR 30mg - 1/2tab/d... ~  labs 4/11 showed normal BMet... & norm labs in ER 5/11 x BS=189. ~  11/11: BP= 116/80 & similar at home- notes reduced symptoms of lightheadedness & denies HA, visual changes, CP, palipit, syncope, edema, etc... ~  12/18/10:  BP= 134/78, feeling OK & denies symptoms... ~  01/04/11:  Post hosp check & BP 150/80 supine, 140/70 sitting,  130/70 standing (no symptoms)> decided to decr the ToprolXL & Exforge to 1/2 of each.  ARTERIOSCLEROTIC HEART DISEASE (ICD-414.00) - on IMDUR 30mg - 1/2 tab daily;  + ASA 81mg /d & PLAVIX 75mg /d...  ~  cath 1998 & 2003 w/ LAD lesion~50%, no other abn seen... ~  neg Myoview 11/08 w/o ischemia and EF=83%. ~  She follows up w/ DrRoss yearly... ~  saw DrRoss on 2/09 w/ review of 11/08 hosp for atypical CP...  doing satis without recurrent CP but too sedentary and difficulty getting around w/ her arthritis... ~  she had Cards f/u DrRoss 12/11 & was felt to be stable, no angina, no SOB, & no change in med Rx; EKG= NSR, WNL.Marland Kitchen. ~  5/12:  Hosp eval included> EKG (in ER) w/ [id number], NSSTTWA;  2DEcho showed normal LVF (55-60%) & no wall  motion abn, essent norm valves etc;  VENOUS INSUFFICIENCY, CHRONIC (ICD-459.81) - she has chronic venous insuffic changes in her LE's w/ edema, bruising, etc;  ~  she knows to elim sodium, elevate legs, wear support hose when able, etc...  HYPERCHOLESTEROLEMIA (ICD-272.0) - on SIMVASTATIN 40mg /d & FISH OIL... ~  FLP 9/08 showed TChol 144, TG 204, HDL 34, LDL 72 ~  FLP 3/09 showed TChol 143, TG 178, HDL 33, LDL 74... rec- contin meds, incr exercise. ~  FLP 1/10 showed TChol 150, TG 162, HDL 39, LDL 79... DrRoss rec same meds, better diet. ~  FLP 7/10 showed TChol 143, TG 176, HDL 37, LDL 71 ~  FLP 4/11 showed TChol 154, TG 154, HDL 44, LDL 79 ~  FLP 3/12 showed TChol 150, TG 166, HDL 39, LDL 78  HYPOTHYROIDISM (ICD-244.9) - on LEVOTHYROID 31mcg/d... ~  labs 3/09 showed TSH= 0.98 ~  labs 7/10 showed TSH= 1.37 ~  labs 4/11 showed TSH= 1.39 ~  CDopplers 8/11 showed incidental thyroid nodule; subseq Thyroid Ultrasound showed mult bilat nodules & bx of dom lesion= non-neoplastic goiter> continue same med. ~  Labs 3/12 showed TSH= 1.16...  ~  Labs 5/12 in [place] showed TSH=0.74, FreeT3=81 (80-204), FreeT4=1.36 (0.80-1.80)  GERD (ICD-530.81) - EGD by [facility] 6/06 showed HH, mild gastritis... ~  last saw [facility] Aug09 w/ Carafate added (off now).  DIVERTICULOSIS OF COLON (ICD-562.10) - followed by [facility] and last colonoscopy was 4/08 showing divertics, 2 sm polyps (tubular adenomas), & hems...  CHOLELITHIASIS (ICD-574.20) - seen on prev CTAbd 4/08... s/p lap chole 6/08 by DrHoxworth...  URINARY TRACT INFECTION, CHRONIC (ICD-599.0) - mult cultures in 2009 showed sens EColi & Rx w/ Cipro... we discussed the need for Urology eval if UTI's continue to recur (for further eval). ~  5/11:  ER eval for mult symptoms and found to have a UTI- treated w/ Bacterim & resolved. ~  3/12:  Saw TP w/ UTI from a pansens EColi & Rx w/ Cipro- resolved...  DEGENERATIVE JOINT DISEASE (ICD-715.90) - she has severe  DJD w/ prev bilat THR, right TKR, and left femur fx w/ pinning 2003... c/o difficulty w/ ambulation, exercise, etc... she takes Glucoamine Bid + MVI etc... ~  labs 7/10 showed Vit D level = 31... rec> start Vit D 1000 u daily. ~  11/11:  c/o right leg pain & she saw DrRamos w/ eval revealing multilevel DDD & she has an epid steriod injection w/ some temp benefit. ~  5/12: she uses OTC meds for pain relief...  TIA (ICD-435.9) & INTRACRANIAL ANEURYSM (ICD-437.3) - had left side numbness 1996- saw DrWeymann> MRI was neg... Rx  w/ ASA. ~  seen by TParrett 4/09 w/ numbness & paresthesias- norm sed, B12, folate... improved w/ reassurance. 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 PLAVIX 75mg /d added. ~  ER 5/11 & 7/11> facial numbness & tingling w/ nothing found & she decided to stop the Aricept... CT Br w/ atrophy, sm vessel dis, atherosclerotic changes, NAD.Marland Kitchen. subseq MRA for f/u sm left paraophthalmic art aneurysm was unchanged... CDopplers 8/11 showed mild calcif plaque bilat, 0-39% bilat ICA stenoses, & incidental thyroid nodule. ~  ER & Hosp 5/12 for syncope> CT Br showed atrophy, sm vessel dis, vasc calcif, NAD;  EEG was WNL;  CDopplers were neg x for mild plaque- no extracranial carotid dis;  MEMORY LOSS (ICD-780.93) - started on ARICEPT 2/09 and sl better over time... she does her own meds w/ daughter checking on her... ~  10/11:  pt had stopped Aricept on her own & offered restart vs Neuro consult> she decided to restart. ~  She had a Neuro eval from DrWillis 1/12 & he agreed w/ Aricept Rx & will consider adding Namenda later; he rec stopping the Amitriptyline & they feel her paresthesias are sl better off this med...  ANXIETY (ICD-300.00) - on ALPRAZOLAM 0.5mg  Bid Prn...  Hx of ANEMIA (ICD-285.9) - Hx of anemia in the past... Hg has remained WNL since  then... ~  labs 7/10 showed Hg= 14.0 ~  labs 4/11 showed Hg= 14.7 ~  Labs 5/12 in [place] showed Hg= 13.9-14.7   Past Surgical History  Procedure Date  . Laparoscopic cholecystectomy 01/2007    Dr. Odie Sera  . Abdominal hysterectomy   . Bilateral thr's   . Right total knee 11/1999    Dr. Leslee Home  . Left femur fracture 04/2002    Dr. Ranell Patrick  . Lumbar fusion     Outpatient Encounter Prescriptions as of 01/04/2011  Medication Sig Dispense Refill  . ALPRAZolam (XANAX) 0.5 MG tablet  Take 1/2 to 1 tablet by mouth three times daily as needed for nerves  90 tablet  0  . amLODipine-valsartan (EXFORGE) 5-160 MG per tablet Take 1 tablet by mouth daily.        Marland Kitchen aspirin 81 MG tablet Take 81 mg by mouth daily.        . clopidogrel (PLAVIX) 75 MG tablet Take 75 mg by mouth daily.        Marland Kitchen dextromethorphan-guaiFENesin (MUCINEX DM) 30-600 MG per 12 hr tablet Take 2 tablets by mouth every 12 (twelve) hours. With plenty of fluids       . donepezil (ARICEPT) 10 MG tablet Take 10 mg by mouth at bedtime as needed.        . fish oil-omega-3 fatty acids 1000 MG capsule Take 1 g by mouth daily.        . furosemide (LASIX) 40 MG tablet Take 40 mg by mouth daily.        Marland Kitchen glucosamine-chondroitin 500-400 MG tablet Take 1 tablet by mouth daily.        . isosorbide mononitrate (IMDUR) 30 MG 24 hr tablet Take 1/2 tablet by mouth daily       . levothyroxine (SYNTHROID, LEVOTHROID) 75 MCG tablet Take 75 mcg by mouth daily.        . metoprolol (TOPROL-XL) 100 MG 24 hr tablet Take 100 mg by mouth daily.        Marland Kitchen  Multiple Vitamins-Minerals (CENTRUM SILVER PO) Take 1 tablet by mouth daily.        . potassium chloride SA (K-DUR,KLOR-CON) 20 MEQ tablet Take 20 mEq by mouth daily.        . simvastatin (ZOCOR) 40 MG tablet Take 40 mg by mouth at bedtime.        . vitamin D, CHOLECALCIFEROL, 400 UNITS tablet Take 400 Units by mouth daily.          Allergies  Allergen Reactions  . Morphine     Hallucinations   .  Ofloxacin     Unable to remember    Review of Systems  REVIEW OF SYSTEMS:     See HPI - all other systems neg except as noted.    The patient complains of decreased hearing, dyspnea on exertion, muscle weakness, and difficulty walking.  The patient denies anorexia, fever, weight loss, weight gain, vision loss, hoarseness, chest pain, syncope, peripheral edema, prolonged cough, headaches, hemoptysis, abdominal pain, melena, hematochezia, severe indigestion/heartburn, hematuria, incontinence, suspicious skin lesions, transient blindness, depression, unusual weight change, abnormal bleeding, enlarged lymph nodes, and angioedema.     Physical Exam  PHYSICAL EXAM:  WD, Overweight, 78 y/o WF in NAD... GENERAL:  Alert & oriented; pleasant & cooperative... HEENT:  [place]/AT, EOM-wnl, PERRLA, EACs-clear, TMs-wnl, NOSE-clear, THROAT-clear & wnl. NECK:  Supple w/ fairROM; no JVD; normal carotid impulses w/o bruits; no thyromegaly or nodules palpated; no lymphadenopathy. CHEST:  Clear to P & A; without wheezes/ rales/ or rhonchi heard... HEART:  Regular Rhythm; without murmurs/ rubs/ or gallops detected... ABDOMEN:  Soft & nontender; normal bowel sounds; no organomegaly or masses palpated... EXT:  mod arthritic changes; +varicose veins/ +venous insuffic/ 1+ edema;  intact pulses in LE's... NEURO:  CN's intact; no focal neuro deficit... DERM:  No lesions noted; no rash etc...   ASSESSMENT & PLAN:  HBP>  No postural BP changes but intermittent low readings per home health since discharge- we discussed cutting back the ToprolXL & Exforge to 1/2 of each; monitor BP at home & keep a log; ROV in 3wks to recheck...  ASHD>  Stable on meds + IMDUR & her ASA/ PLAVIX;  Continue same meds...  Ven Insuffic>  Stable, at baseline w/ mild swelling esp late in the day;  Continue Rx...  CHOL>  Labs look good on Simva + Fish Oil...  Hypothy>  Stable on the Levo75...  GU>  She responded to Cipro previously, & last  UA was clear...  DJD/ LBP>  She had epidural steroid injection from DrRamos in the past;  Currently using OTC meds prn...  ASPVD, TIA, ANEURYSM>    << See above >>  MEMORY LOSS>  On Aricept & followed by DrWillis.Marland KitchenMarland Kitchen

## 2011-01-04 NOTE — Progress Notes (Deleted)
HPI    Review of Systems        Physical Exam

## 2011-01-09 ENCOUNTER — Telehealth: Payer: Self-pay | Admitting: *Deleted

## 2011-01-09 MED ORDER — ALPRAZOLAM 0.5 MG PO TABS: 0.5000 mg | ORAL_TABLET | Freq: Three times a day (TID) | ORAL | Status: DC | PRN

## 2011-01-09 NOTE — Telephone Encounter (Signed)
LMTCB

## 2011-01-09 NOTE — Telephone Encounter (Signed)
Andrea Mccoy returning phone call.Andrea Mccoy

## 2011-01-09 NOTE — Telephone Encounter (Signed)
Pt's daughter advised she called [facility] for refill and was advised a new script has been sent in from SN sig 1 tablet by mouth once daily on Alprazolam 0.5mg  with #90. Per our records refill sent in 1/2 to 1 tab by mouth three times a day as needed for nerves or sleep. Daughter states in the past she has given pt 1 tab by mouth twice daily and the # 90 will only last 1 month. I called [facility] and spoke with Pharmacist Becky and advised per our records 1/2 to 1 tab tid prn for sleep or nerves #270 x 0 refills. Called and advised pt's daughter of same.

## 2011-01-11 NOTE — Discharge Summary (Signed)
[name], Andrea Mccoy              [name]:  [id number]  MEDICAL RECORD NO.:  [id number]           PATIENT TYPE:  I  LOCATION:  1514                         FACILITY:  [facility]  PHYSICIAN:  Osvaldo Shipper, MD     DATE OF BIRTH:  1932/03/30  DATE OF ADMISSION:  12/27/2010 DATE OF DISCHARGE:  12/29/2010                              DISCHARGE SUMMARY   PRIMARY CARE PHYSICIAN:  The patient's primary care physician Dr. Alroy Dust.  CONSULTATION:  No consultations obtained during this admission.  IMAGING STUDIES:  Imaging studies done so far include CT of the head which showed no intracranial hemorrhage or infarct, global atrophy, small vessel disease changes and vascular calcifications were seen.  Other studies done include: 1. Carotid Doppler which did not show any significant stenosis. 2. Echocardiogram was done which showed EF of 55-60% with normal wall     motion.  Trivial aortic regurgitation and a small pericardial     effusion was possibly present. 3. EEG was done which was normal study.  DISCHARGE DIAGNOSES: 1. Syncope, possibly vasovagal. 2. Adverse reaction to trazodone in the form of tremors and confusion. 3. Hypertension, stable. 4. Hypothyroidism, stable. 5. History of coronary artery disease, stable. 6. History of dementia, stable.  BRIEF HOSPITAL COURSE:  Briefly, this is 77 year old Caucasian female who presented to the hospital with syncope.  The patient was visiting relative here in [place] when she apparently passed out.  No seizure- type activity was noted, however, there was some urinary incontinence. The patient was taken to the ED, was evaluated and was subsequently observed in the hospital.  She underwent significant workup here which has not revealed any clear etiology for her symptoms.  She never had any chest pain or shortness of breath.  Today she was ambulating using a walker and she did well with physical therapy, however, home health  PT has been recommended and this will be arranged for this patient.  This morning when I saw the patient, the patient was complaining of some tremors.  Daughter was concerned about the confusion.  The confusion did improve as the day went on.  The tremor is once in a while.  She did get a dose of trazodone last night which was prescribed for insomnia and trazodone can cause some of these symptoms, so I would just recommend to not take that medication again.  We will also list it as an allergy on her medication profile here but I have reassured the daughter that when the patient goes back home she should be able to reorient herself and her confusion should improve.  Her dementia also probably makes this worse.  So on the day of discharge, the patient apart from the above issues she is stable.  Orthostatics have been negative.  Rest of vital signs are stable.  We will check her room air oxygenation before she is discharged home.  Lungs were clear to auscultation.  Cardiovascular, S1, S2 is normal, regular.  Neurologically, she is little bit confused but is not having focal deficits.  DISCHARGE MEDICATIONS: 1. Alprazolam 0.5 mg twice daily. 2. Aricept 10 mg daily.  3. Aspirin 81 mg daily. 4. Centrum Silver every evening. 5. Cosamin DS OTC every morning. 6. Exforge 5/160 every morning. 7. Furosemide 40 mg every morning. 8. Isosorbide mononitrate XR 30 mg half tablet every morning. 9. Levothyroxine, unknown dose, every morning. 10.Metoprolol XL 100 mg daily. 11.Omega-3 fatty acids twice daily. 12.Plavix 75 mg every morning. 13.Potassium chloride 20 mEq every morning. 14.Simvastatin 40 mg daily. 15.Systane eyedrops as needed. 16.Vitamin D3 400 units every morning.  FOLLOWUP:  Follow up with Dr. Kriste Basque within a week for further evaluation of syncope.  We will also ask Dr. Kriste Basque to re-evaluate use of simvastatin along with amlodipine.  There has been increase incidence  of rhabdomyolysis in these instances and would ask him to consider alternative statin if reasonable.  DIET:  Heart-healthy.  Physical activity, use a walker.  Home health will be arranged.  Total time on this discharge encounter 35 minutes.  Osvaldo Shipper, MD     GK/MEDQ  D:  12/29/2010  T:  12/29/2010  Job:  [id number]  cc:   Lonzo Cloud. Kriste Basque, MD [address] [place] [address]  Electronically Signed by Osvaldo Shipper MD on 01/11/2011 11:03:12 PM

## 2011-01-25 ENCOUNTER — Encounter: Payer: Self-pay | Admitting: Pulmonary Disease

## 2011-01-25 ENCOUNTER — Ambulatory Visit (INDEPENDENT_AMBULATORY_CARE_PROVIDER_SITE_OTHER): Payer: Medicare HMO | Admitting: Pulmonary Disease

## 2011-01-25 DIAGNOSIS — I2510 Atherosclerotic heart disease of native coronary artery without angina pectoris: Secondary | ICD-10-CM

## 2011-01-25 DIAGNOSIS — J309 Allergic rhinitis, unspecified: Secondary | ICD-10-CM

## 2011-01-25 DIAGNOSIS — J209 Acute bronchitis, unspecified: Secondary | ICD-10-CM

## 2011-01-25 DIAGNOSIS — F411 Generalized anxiety disorder: Secondary | ICD-10-CM

## 2011-01-25 DIAGNOSIS — G459 Transient cerebral ischemic attack, unspecified: Secondary | ICD-10-CM

## 2011-01-25 DIAGNOSIS — R413 Other amnesia: Secondary | ICD-10-CM

## 2011-01-25 DIAGNOSIS — E042 Nontoxic multinodular goiter: Secondary | ICD-10-CM

## 2011-01-25 DIAGNOSIS — I872 Venous insufficiency (chronic) (peripheral): Secondary | ICD-10-CM

## 2011-01-25 DIAGNOSIS — I10 Essential (primary) hypertension: Secondary | ICD-10-CM

## 2011-01-25 DIAGNOSIS — E039 Hypothyroidism, unspecified: Secondary | ICD-10-CM

## 2011-01-25 DIAGNOSIS — E7800 Pure hypercholesterolemia, unspecified: Secondary | ICD-10-CM

## 2011-01-25 DIAGNOSIS — M1990 Unspecified osteoarthritis, unspecified site: Secondary | ICD-10-CM

## 2011-01-25 NOTE — Patient Instructions (Signed)
Today we updated your med list in EPIC>    Continue your current meds the same...  Call for any problems...  Let's plan a follow up visit in 6-8 weeks.Marland KitchenMarland Kitchen

## 2011-01-27 ENCOUNTER — Encounter: Payer: Self-pay | Admitting: Pulmonary Disease

## 2011-01-27 NOTE — Progress Notes (Signed)
HPI   [name] here for a follow up visit... she has multiple medical problems as noted below...   ~  May 11, 2010:  she went to the ER again 7/11 w/ facial numbness& tingling- nothing found, & she decided to stop the Aricept... CT Br w/ atrophy, sm vessel dis, atherosclerotic changes, NAD.Marland Kitchen. subseq MRA for f/u sm left paraophthalmic art aneurysm was unchanged... CDopplers 8/11 showed mild calcif plaque bilat, 0-39% bilat ICA stenoses, & incidental thyroid nodule... they insisted on eval of thyroid nodule & Sonar revealed multinodular thyroid & bx of dom nodule= non-neoplastic goiter... BP remains stable on meds;  denies CP, palpit, ch in SOB, etc;  not fasting today for blood work- OK Flu shot.  ~  June 12, 2010:  she has persist/ recurrent numbness & tingling, and family notes more forgetful even after restarting the Aricept 10mg  "they won't let me drive" she says... we discussed f/u Neuro eval for these symptoms (prev eval DrWillis)... she had some right leg pain "it's from my back" & sched for epid shot tomorrow (ASA/ Plavix on hold)... Breathing is at baseline;  BP stable on meds;  Lipids have been good, thyroid stable etc...  ~  Dec 18, 2010: 86mo ROV & reasonably stable>  She saw DrRamos 11/11 for LBP w/ eval revealing multilevel DDD & she has an epid steriod injection w/ some temp benefit & she uses OTC meds for pain relief...  She had Cards f/u DrRoss 12/11 & was felt to be stable, no angina, no SOB, & no change in med Rx...  She had a Neuro eval from DrWillis 1/12 & he agreed w/ Aricept Rx & will consider adding Namenda later; he rec stopping the Amitriptyline & they feel her paresthesias are sl better off this med...  She was also treated for a UTI 3/12 by TP w/ a pansens EColi (given Cipro250 w/ resolution)...  ~  Jan 04, 2011:  2 week ROV & post-Hosp check> she was visiting a relative in the hosp 12/27/10 when she felt light headed & fainted briefly (fell back into a chair) then  confused- taken to ER w/ syncope & Adm for 2d> CT Br showed atrophy, sm vessel dis, vasc calcif, NAD;  EEG was WNL;  CDopplers were neg x for mild plaque- no extracranial carotid dis;  EKG (in ER) w/ [id number], NSSTTWA;  2DEcho showed normal LVF (55-60%) & no wall motion abn, essent norm valves etc;  Labs were norm as well... Disch on same meds> since disch she's been sl weak & BP at home sl low (home health said ~100/50 & we decr her Exforge)...  We decided to cut back slightly on her BP Rx & monitor BP closely at home> rec to take 1/2 of the Exforge & 1/2 of the ToprolXL.  ~  January 25, 2011:  12mo ROV after we decr her Metoprolol & Exforge to 1/2 the dose due to weakness & low BP around 100;  Since then she is feeling better & home BP log shows BPs 120-130 range, BP today 112/62; she is doing balance exercises at home & slowly improving; they note that energy isn't good & daugh thinks she's depressed; she is on the wait list for meals on wheels; daughter checks on her every day; she takes alpraz prn 7 asked to minimize this if depressed & offered antidepressant but they decline... We will recheck 6-8 wks.          Problem List:  Hx of ASTHMATIC BRONCHITIS (ICD-466.0) - she is a never smoker;  breathing has been good & she denies cough, sputum, dyspnea, etc...  HYPERTENSION (ICD-401.9) - on TOPROL XL 100- 1/2 daily,  EXFORGE 5-160 1/2 daily,  LASIX 40mg /d, KCl 34mEq/d, & IMDUR 30mg - 1/2tab/d... ~  labs 4/11 showed normal BMet... & norm labs in ER 5/11 x BS=189. ~  11/11: BP= 116/80 & similar at home- notes reduced symptoms of lightheadedness & denies HA, visual changes, CP, palipit, syncope, edema, etc... ~  12/18/10:  BP= 134/78, feeling OK & denies symptoms... ~  01/04/11:  Post hosp check & BP 150/80 supine, 140/70 sitting, 130/70 standing (no symptoms)> decided to decr the ToprolXL & Exforge to 1/2 of each. ~  6/12:  Home BPs & BP here today= 112/62, improved on current doses, continue  same.  ARTERIOSCLEROTIC HEART DISEASE (ICD-414.00) - on IMDUR 30mg - 1/2 tab daily;  + ASA 81mg /d & PLAVIX 75mg /d...  ~  cath 1998 & 2003 w/ LAD lesion~50%, no other abn seen... ~  neg Myoview 11/08 w/o ischemia and EF=83%. ~  She follows up w/ DrRoss yearly... ~  saw DrRoss on 2/09 w/ review of 11/08 hosp for atypical CP...  doing satis without recurrent CP but too sedentary and difficulty getting around w/ her arthritis... ~  she had Cards f/u DrRoss 12/11 & was felt to be stable, no angina, no SOB, & no change in med Rx; EKG= NSR, WNL.Marland Kitchen. ~  5/12:  Hosp eval included> EKG (in ER) w/ [id number], NSSTTWA;  2DEcho showed normal LVF (55-60%) & no wall motion abn, essent norm valves etc;  VENOUS INSUFFICIENCY, CHRONIC (ICD-459.81) - she has chronic venous insuffic changes in her LE's w/ edema, bruising, etc;  ~  she knows to elim sodium, elevate legs, wear support hose when able, etc...  HYPERCHOLESTEROLEMIA (ICD-272.0) - on SIMVASTATIN 40mg /d & FISH OIL... ~  FLP 9/08 showed TChol 144, TG 204, HDL 34, LDL 72 ~  FLP 3/09 showed TChol 143, TG 178, HDL 33, LDL 74... rec- contin meds, incr exercise. ~  FLP 1/10 showed TChol 150, TG 162, HDL 39, LDL 79... DrRoss rec same meds, better diet. ~  FLP 7/10 showed TChol 143, TG 176, HDL 37, LDL 71 ~  FLP 4/11 showed TChol 154, TG 154, HDL 44, LDL 79 ~  FLP 3/12 showed TChol 150, TG 166, HDL 39, LDL 78  HYPOTHYROIDISM (ICD-244.9) - on LEVOTHYROID 66mcg/d... ~  labs 3/09 showed TSH= 0.98 ~  labs 7/10 showed TSH= 1.37 ~  labs 4/11 showed TSH= 1.39 ~  CDopplers 8/11 showed incidental thyroid nodule; subseq Thyroid Ultrasound showed mult bilat nodules & bx of dom lesion= non-neoplastic goiter> continue same med. ~  Labs 3/12 showed TSH= 1.16...  ~  Labs 5/12 in [place] showed TSH=0.74, FreeT3=81 (80-204), FreeT4=1.36 (0.80-1.80)  GERD (ICD-530.81) - not currently on PPI med & advised PRILOSEC 20mg  OTC Prn... ~  EGD by [facility] 6/06 showed HH, mild  gastritis... ~  last saw [facility] Aug09 w/ Carafate added (off now).  DIVERTICULOSIS OF COLON (ICD-562.10) - followed by [facility] and last colonoscopy was 4/08 showing divertics, 2 sm polyps (tubular adenomas), & hems...  CHOLELITHIASIS (ICD-574.20) - seen on prev CTAbd 4/08... s/p lap chole 6/08 by DrHoxworth...  URINARY TRACT INFECTION, CHRONIC (ICD-599.0) - mult cultures in 2009 showed sens EColi & Rx w/ Cipro... we discussed the need for Urology eval if UTI's continue to recur (for further eval). ~  5/11:  ER eval for  mult symptoms and found to have a UTI- treated w/ Bacterim & resolved. ~  3/12:  Saw TP w/ UTI from a pansens EColi & Rx w/ Cipro- resolved...  DEGENERATIVE JOINT DISEASE (ICD-715.90) - she has severe DJD w/ prev bilat THR, right TKR, and left femur fx w/ pinning 2003... c/o difficulty w/ ambulation, exercise, etc... she takes Glucoamine Bid + MVI etc... ~  labs 7/10 showed Vit D level = 31... rec> start Vit D 1000 u daily. ~  11/11:  c/o right leg pain & she saw DrRamos w/ eval revealing multilevel DDD & she has an epid steriod injection w/ some temp benefit. ~  5/12: she uses OTC meds for pain relief...  TIA (ICD-435.9) & INTRACRANIAL ANEURYSM (ICD-437.3) - had left side numbness 1996- saw DrWeymann> MRI was neg... Rx w/ ASA. ~  seen by TParrett 4/09 w/ numbness & paresthesias- norm sed, B12, folate... improved w/ reassurance. 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 PLAVIX 75mg /d added. ~  ER 5/11 & 7/11> facial numbness & tingling w/ nothing found & she decided to stop the Aricept... CT Br w/ atrophy, sm vessel dis, atherosclerotic changes, NAD.Marland Kitchen. subseq MRA for f/u sm left paraophthalmic art aneurysm was unchanged... CDopplers 8/11 showed mild calcif plaque bilat, 0-39% bilat ICA stenoses, & incidental thyroid nodule. ~  ER & Hosp  5/12 for syncope> CT Br showed atrophy, sm vessel dis, vasc calcif, NAD;  EEG was WNL;  CDopplers were neg x for mild plaque- no extracranial carotid dis;  MEMORY LOSS (ICD-780.93) - started on ARICEPT 2/09 and sl better over time... she does her own meds w/ daughter checking on her... ~  10/11:  pt had stopped Aricept on her own & offered restart vs Neuro consult> she decided to restart. ~  She had a Neuro eval from DrWillis 1/12 & he agreed w/ Aricept Rx & will consider adding Namenda later; he rec stopping the Amitriptyline & they feel her paresthesias are sl better off this med...  ANXIETY (ICD-300.00) - on ALPRAZOLAM 0.5mg  Bid Prn... ~  6/12: daughter thought she might be depressed & offered trial antidepressant med but they decline more meds at this time...  Hx of ANEMIA (ICD-285.9) - Hx of anemia in the past... Hg has remained WNL since then... ~  labs 7/10 showed Hg= 14.0 ~  labs 4/11 showed Hg= 14.7 ~  Labs 5/12 in [place] showed Hg= 13.9-14.7   Past Surgical History  Procedure Date  . Laparoscopic cholecystectomy 01/2007    Dr. Odie Sera  . Abdominal hysterectomy   . Bilateral thr's   . Right total knee 11/1999    Dr. Leslee Home  . Left femur fracture 04/2002    Dr. Ranell Patrick  . Lumbar fusion     Outpatient Encounter Prescriptions as of 01/25/2011  Medication Sig Dispense Refill  . ALPRAZolam (XANAX) 0.5 MG tablet Take 1/2 to 1 tablet by mouth three times daily as needed for nerves       . amLODipine-valsartan (EXFORGE) 5-160 MG per tablet Take 1/2 tablet by mouth once daily      . aspirin 81 MG tablet Take 81 mg by mouth daily.        . clopidogrel (PLAVIX) 75 MG tablet Take 75 mg by mouth daily.        Marland Kitchen dextromethorphan-guaiFENesin (MUCINEX DM) 30-600 MG per 12 hr  tablet Take 2 tablets by mouth every 12 (twelve) hours. With plenty of fluids       . donepezil (ARICEPT) 10 MG tablet Take 10 mg by mouth at bedtime as needed.        . fish oil-omega-3 fatty acids 1000 MG capsule  Take 1 g by mouth daily.        . furosemide (LASIX) 40 MG tablet Take 40 mg by mouth daily.        Marland Kitchen glucosamine-chondroitin 500-400 MG tablet Take 1 tablet by mouth daily.        . isosorbide mononitrate (IMDUR) 30 MG 24 hr tablet Take 1/2 tablet by mouth daily       . levothyroxine (SYNTHROID, LEVOTHROID) 75 MCG tablet Take 75 mcg by mouth daily.        . metoprolol (TOPROL-XL) 100 MG 24 hr tablet Take 1/2 tablet by mouth once daily      . mometasone (NASONEX) 50 MCG/ACT nasal spray Place 2 sprays into the nose daily.  17 g  11  . Multiple Vitamins-Minerals (CENTRUM SILVER PO) Take 1 tablet by mouth daily.        . potassium chloride SA (K-DUR,KLOR-CON) 20 MEQ tablet Take 20 mEq by mouth daily.        . simvastatin (ZOCOR) 40 MG tablet Take 40 mg by mouth at bedtime.        . vitamin D, CHOLECALCIFEROL, 400 UNITS tablet Take 400 Units by mouth daily.          Allergies  Allergen Reactions  . Morphine     Hallucinations   . Ofloxacin     Unable to remember    Review of Systems   REVIEW OF SYSTEMS:     See HPI - all other systems neg except as noted.    The patient complains of decreased hearing, dyspnea on exertion, muscle weakness, and difficulty walking.  The patient denies anorexia, fever, weight loss, weight gain, vision loss, hoarseness, chest pain, syncope, peripheral edema, prolonged cough, headaches, hemoptysis, abdominal pain, melena, hematochezia, severe indigestion/heartburn, hematuria, incontinence, suspicious skin lesions, transient blindness, depression, unusual weight change, abnormal bleeding, enlarged lymph nodes, and angioedema.     Physical Exam   PHYSICAL EXAM:  WD, Overweight, [name] in NAD... GENERAL:  Alert & oriented; pleasant & cooperative... HEENT:  [place]/AT, EOM-wnl, PERRLA, EACs-clear, TMs-wnl, NOSE-clear, THROAT-clear & wnl. NECK:  Supple w/ fairROM; no JVD; normal carotid impulses w/o bruits; no thyromegaly or nodules palpated; no  lymphadenopathy. CHEST:  Clear to P & A; without wheezes/ rales/ or rhonchi heard... HEART:  Regular Rhythm; without murmurs/ rubs/ or gallops detected... ABDOMEN:  Soft & nontender; normal bowel sounds; no organomegaly or masses palpated... EXT:  mod arthritic changes; +varicose veins/ +venous insuffic/ 1+ edema;  intact pulses in LE's... NEURO:  CN's intact; no focal neuro deficit... DERM:  No lesions noted; no rash etc...   ASSESSMENT & PLAN:  HBP>  BPs improved w/ decr in doses to 1/2 tabs at last OV; keep same meds & monitor BP at home, call for problems  ASHD>  Stable on meds + IMDUR & her ASA/ PLAVIX;  Continue same meds...  Ven Insuffic>  Stable, at baseline w/ mild swelling esp late in the day;  Continue Rx...  CHOL>  Labs look good on Simva + Fish Oil...  Hypothy>  Stable on the Levo75...  GU>  She responded to Cipro previously, & last UA was clear...  DJD/ LBP>  She had epidural steroid injection from DrRamos in the past;  Currently using OTC meds prn...  ASPVD, TIA, ANEURYSM>    << See above >>  MEMORY LOSS>  On Aricept & followed by DrWillis.Marland KitchenMarland Kitchen

## 2011-02-20 ENCOUNTER — Other Ambulatory Visit: Payer: Self-pay | Admitting: Pulmonary Disease

## 2011-03-30 ENCOUNTER — Encounter: Payer: Self-pay | Admitting: Pulmonary Disease

## 2011-03-30 ENCOUNTER — Other Ambulatory Visit (INDEPENDENT_AMBULATORY_CARE_PROVIDER_SITE_OTHER): Payer: Medicare HMO

## 2011-03-30 ENCOUNTER — Ambulatory Visit (INDEPENDENT_AMBULATORY_CARE_PROVIDER_SITE_OTHER): Payer: Medicare HMO | Admitting: Pulmonary Disease

## 2011-03-30 DIAGNOSIS — K219 Gastro-esophageal reflux disease without esophagitis: Secondary | ICD-10-CM

## 2011-03-30 DIAGNOSIS — G459 Transient cerebral ischemic attack, unspecified: Secondary | ICD-10-CM

## 2011-03-30 DIAGNOSIS — I10 Essential (primary) hypertension: Secondary | ICD-10-CM

## 2011-03-30 DIAGNOSIS — I671 Cerebral aneurysm, nonruptured: Secondary | ICD-10-CM

## 2011-03-30 DIAGNOSIS — I2510 Atherosclerotic heart disease of native coronary artery without angina pectoris: Secondary | ICD-10-CM

## 2011-03-30 DIAGNOSIS — R413 Other amnesia: Secondary | ICD-10-CM

## 2011-03-30 DIAGNOSIS — I872 Venous insufficiency (chronic) (peripheral): Secondary | ICD-10-CM

## 2011-03-30 DIAGNOSIS — E7800 Pure hypercholesterolemia, unspecified: Secondary | ICD-10-CM

## 2011-03-30 DIAGNOSIS — F411 Generalized anxiety disorder: Secondary | ICD-10-CM

## 2011-03-30 DIAGNOSIS — E039 Hypothyroidism, unspecified: Secondary | ICD-10-CM

## 2011-03-30 DIAGNOSIS — K5730 Diverticulosis of large intestine without perforation or abscess without bleeding: Secondary | ICD-10-CM

## 2011-03-30 DIAGNOSIS — Z79899 Other long term (current) drug therapy: Secondary | ICD-10-CM

## 2011-03-30 DIAGNOSIS — M1990 Unspecified osteoarthritis, unspecified site: Secondary | ICD-10-CM

## 2011-03-30 LAB — BASIC METABOLIC PANEL
Chloride: 106 mEq/L (ref 96–112)
GFR: 56.51 mL/min — ABNORMAL LOW (ref >60.00)
Potassium: 4.3 mEq/L (ref 3.5–5.1)

## 2011-03-30 LAB — LIPID PANEL
LDL Cholesterol: 72 mg/dL (ref 0–99)
Total CHOL/HDL Ratio: 3
VLDL: 23.6 mg/dL (ref 0.0–40.0)

## 2011-03-30 LAB — HEMOGLOBIN A1C: Hgb A1c MFr Bld: 6.1 % (ref 4.6–6.5)

## 2011-03-30 MED ORDER — DONEPEZIL HCL 10 MG PO TABS: 10.0000 mg | ORAL_TABLET | Freq: Every evening | ORAL | Status: DC | PRN

## 2011-03-30 MED ORDER — MEMANTINE HCL 10 MG PO TABS: 10.0000 mg | ORAL_TABLET | Freq: Two times a day (BID) | ORAL | Status: DC

## 2011-03-30 NOTE — Patient Instructions (Signed)
Today we updated your med list in EPIC...    We decided to add NAMENDA> we gave you a starter pack for 30d trial  Today we did your follow up fasting blood work...    Please call the [facility] in a few days for your results...    Dial [id number] & when prompted enter your patient number followed by the # symbol...    Your patient number is:  [id number]#  Stay as active as possible, & be careful...  Let's plan a follow up visit in 3-4 months, sooner if needed for problems.Marland KitchenMarland Kitchen

## 2011-04-01 ENCOUNTER — Encounter: Payer: Self-pay | Admitting: Pulmonary Disease

## 2011-04-01 NOTE — Progress Notes (Signed)
HPI  [name] here for a follow up visit... she has multiple medical problems as noted below...   ~  May 11, 2010:  she went to the ER again 7/11 w/ facial numbness& tingling- nothing found, & she decided to stop the Aricept... CT Br w/ atrophy, sm vessel dis, atherosclerotic changes, NAD.Marland Kitchen. subseq MRA for f/u sm left paraophthalmic art aneurysm was unchanged... CDopplers 8/11 showed mild calcif plaque bilat, 0-39% bilat ICA stenoses, & incidental thyroid nodule... they insisted on eval of thyroid nodule & Sonar revealed multinodular thyroid & bx of dom nodule= non-neoplastic goiter... BP remains stable on meds;  denies CP, palpit, ch in SOB, etc;  not fasting today for blood work- OK Flu shot.  ~  June 12, 2010:  she has persist/ recurrent numbness & tingling, and family notes more forgetful even after restarting the Aricept 10mg  "they won't let me drive" she says... we discussed f/u Neuro eval for these symptoms (prev eval DrWillis)... she had some right leg pain "it's from my back" & sched for epid shot tomorrow (ASA/ Plavix on hold)... Breathing is at baseline;  BP stable on meds;  Lipids have been good, thyroid stable etc...  ~  Dec 18, 2010: 15mo ROV & reasonably stable>  She saw DrRamos 11/11 for LBP w/ eval revealing multilevel DDD & she has an epid steriod injection w/ some temp benefit & she uses OTC meds for pain relief...  She had Cards f/u DrRoss 12/11 & was felt to be stable, no angina, no SOB, & no change in med Rx...  She had a Neuro eval from DrWillis 1/12 & he agreed w/ Aricept Rx & will consider adding Namenda later; he rec stopping the Amitriptyline & they feel her paresthesias are sl better off this med...  She was also treated for a UTI 3/12 by TP w/ a pansens EColi (given Cipro250 w/ resolution)...  ~  Jan 04, 2011:  2 week ROV & post-Hosp check> she was visiting a relative in the hosp 12/27/10 when she felt light headed & fainted briefly (fell back into a chair) then  confused- taken to ER w/ syncope & Adm for 2d> CT Br showed atrophy, sm vessel dis, vasc calcif, NAD;  EEG was WNL;  CDopplers were neg x for mild plaque- no extracranial carotid dis;  EKG (in ER) w/ [id number], NSSTTWA;  2DEcho showed normal LVF (55-60%) & no wall motion abn, essent norm valves etc;  Labs were norm as well... Disch on same meds> since disch she's been sl weak & BP at home sl low (home health said ~100/50 & we decr her Exforge)...  We decided to cut back slightly on her BP Rx & monitor BP closely at home> rec to take 1/2 of the Exforge & 1/2 of the ToprolXL.  ~  January 25, 2011:  104mo ROV after we decr her Metoprolol & Exforge to 1/2 the dose due to weakness & low BP around 100;  Since then she is feeling better & home BP log shows BPs 120-130 range, BP today 112/62; she is doing balance exercises at home & slowly improving; they note that energy isn't good & daugh thinks she's depressed; she is on the wait list for meals on wheels; daughter checks on her every day; she takes Alpraz prn & asked to minimize this if depressed & offered antidepressant but they decline... We will recheck 6-8 wks.  ~  March 30, 2011:  28mo ROV & they note BPs  at home have been pretty good & review of log book shows 120-140/ 70-80 range; daughter note incr forgetful & she's on Aricept (refilled today) & review of DrWillis note indicated low threshold for adding Namenda & they are in agreement;  Otherwise stable> 122/76 today; denies CP, palpit, syncope, ch in DOE, edema; denies cerebral ischemic symptoms; reviewed FLP, TSH, other labs> stable, no changes made...          Problem List:  Hx of ASTHMATIC BRONCHITIS (ICD-466.0) - she is a never smoker;  breathing has been good & she denies cough, sputum, dyspnea, etc...  HYPERTENSION (ICD-401.9) - on TOPROL XL 100- 1/2 daily,  EXFORGE 5-160 1/2 daily,  LASIX 40mg /d, KCl 49mEq/d, & IMDUR 30mg - 1/2tab/d... ~  labs 4/11 showed normal BMet... & norm labs in ER 5/11 x  BS=189. ~  11/11: BP= 116/80 & similar at home- notes reduced symptoms of lightheadedness & denies HA, visual changes, CP, palipit, syncope, edema, etc... ~  12/18/10:  BP= 134/78, feeling OK & denies symptoms... ~  01/04/11:  Post hosp check & BP 150/80 supine, 140/70 sitting, 130/70 standing (no symptoms)> decided to decr the ToprolXL & Exforge to 1/2 of each. ~  Since then BP checks at home & thru office have been good on above meds...  ARTERIOSCLEROTIC HEART DISEASE (ICD-414.00) - on IMDUR 30mg - 1/2 tab daily;  + ASA 81mg /d & PLAVIX 75mg /d...  ~  cath 1998 & 2003 w/ LAD lesion~50%, no other abn seen... ~  neg Myoview 11/08 w/o ischemia and EF=83%. ~  She follows up w/ DrRoss yearly... ~  saw DrRoss on 2/09 w/ review of 11/08 hosp for atypical CP...  doing satis without recurrent CP but too sedentary and difficulty getting around w/ her arthritis... ~  she had Cards f/u DrRoss 12/11 & was felt to be stable, no angina, no SOB, & no change in med Rx; EKG= NSR, WNL.Marland Kitchen. ~  5/12:  Hosp eval included> EKG (in ER) w/ [id number], NSSTTWA;  2DEcho showed normal LVF (55-60%) & no wall motion abn, essent norm valves etc;  VENOUS INSUFFICIENCY, CHRONIC (ICD-459.81) - she has chronic venous insuffic changes in her LE's w/ edema, bruising, etc;  ~  she knows to elim sodium, elevate legs, wear support hose when able, etc...  HYPERCHOLESTEROLEMIA (ICD-272.0) - on SIMVASTATIN 40mg /d & FISH OIL... ~  FLP 9/08 showed TChol 144, TG 204, HDL 34, LDL 72 ~  FLP 3/09 showed TChol 143, TG 178, HDL 33, LDL 74... rec- contin meds, incr exercise. ~  FLP 1/10 showed TChol 150, TG 162, HDL 39, LDL 79... DrRoss rec same meds, better diet. ~  FLP 7/10 showed TChol 143, TG 176, HDL 37, LDL 71 ~  FLP 4/11 showed TChol 154, TG 154, HDL 44, LDL 79 ~  FLP 3/12 showed TChol 150, TG 166, HDL 39, LDL 78 ~  FLP 8/12 showed TChol 143, TG 118, HDL 47, LDL 72  HYPOTHYROIDISM (ICD-244.9) - on LEVOTHYROID 31mcg/d... ~  labs 3/09 showed  TSH= 0.98 ~  labs 7/10 showed TSH= 1.37 ~  labs 4/11 showed TSH= 1.39 ~  CDopplers 8/11 showed incidental thyroid nodule; subseq Thyroid Ultrasound showed mult bilat nodules & bx of dom lesion= non-neoplastic goiter> continue same med. ~  Labs 3/12 showed TSH= 1.16...  ~  Labs 5/12 in [place] showed TSH=0.74, FreeT3=81 (80-204), FreeT4=1.36 (0.80-1.80)  GERD (ICD-530.81) - not currently on PPI med & advised PRILOSEC 20mg  OTC Prn... ~  EGD by [facility] 6/06 showed  HH, mild gastritis... ~  last saw [facility] Aug09 w/ Carafate added (off now).  DIVERTICULOSIS OF COLON (ICD-562.10) - followed by [facility] and last colonoscopy was 4/08 showing divertics, 2 sm polyps (tubular adenomas), & hems...  CHOLELITHIASIS (ICD-574.20) - seen on prev CTAbd 4/08... s/p lap chole 6/08 by DrHoxworth...  URINARY TRACT INFECTION, CHRONIC (ICD-599.0) - mult cultures in 2009 showed sens EColi & Rx w/ Cipro... we discussed the need for Urology eval if UTI's continue to recur (for further eval). ~  5/11:  ER eval for mult symptoms and found to have a UTI- treated w/ Bacterim & resolved. ~  3/12:  Saw TP w/ UTI from a pansens EColi & Rx w/ Cipro- resolved...  DEGENERATIVE JOINT DISEASE (ICD-715.90) - she has severe DJD w/ prev bilat THR, right TKR, and left femur fx w/ pinning 2003... c/o difficulty w/ ambulation, exercise, etc... she takes Glucoamine Bid + MVI etc... ~  labs 7/10 showed Vit D level = 31... rec> start Vit D 1000 u daily. ~  11/11:  c/o right leg pain & she saw DrRamos w/ eval revealing multilevel DDD & she has an epid steriod injection w/ some temp benefit. ~  5/12: she uses OTC meds for pain relief...  TIA (ICD-435.9) & INTRACRANIAL ANEURYSM (ICD-437.3) - had left side numbness 1996- saw DrWeymann> MRI was neg... Rx w/ ASA. ~  seen by TParrett 4/09 w/ numbness & paresthesias- norm sed, B12, folate... improved w/ reassurance. ~  4/10: add-on for numbness & recent fall-  MRI showed sm vessel infarct & mild  intracranial atherosclerotic dis + very small left paraophthalmic artery ICA aneurysm measuring 2x3x3 mm... PLAVIX 75mg /d added. ~  ER 5/11 & 7/11> facial numbness & tingling w/ nothing found & she decided to stop the Aricept... CT Br w/ atrophy, sm vessel dis, atherosclerotic changes, NAD.Marland Kitchen. subseq MRA for f/u sm left paraophthalmic art aneurysm was unchanged... CDopplers 8/11 showed mild calcif plaque bilat, 0-39% bilat ICA stenoses, & incidental thyroid nodule. ~  ER & Hosp 5/12 for syncope> CT Br showed atrophy, sm vessel dis, vasc calcif, NAD;  EEG was WNL;  CDopplers were neg x for mild plaque- no extracranial carotid dis;  MEMORY LOSS (ICD-780.93) - started on ARICEPT 2/09 and sl better over time... she does her own meds w/ daughter checking on her... ~  10/11:  pt had stopped Aricept on her own & offered restart vs Neuro consult> she decided to restart. ~  She had a Neuro eval from DrWillis 1/12 & he agreed w/ Aricept Rx & will consider adding Namenda later; he rec stopping the Amitriptyline & they feel her paresthesias are sl better off this med... ~  8/12:  Daughter concerned for her memory & some agitation; rec to add NAMENDA 10mg  1==>2 per day & she has f/u w/ Neuro soon.  ANXIETY (ICD-300.00) - on ALPRAZOLAM 0.5mg  Bid Prn... ~  6/12: daughter thought she might be depressed & offered trial antidepressant med but they decline more meds at this time...  Hx of ANEMIA (ICD-285.9) - Hx of anemia in the past... Hg has remained WNL since then... ~  labs 7/10 showed Hg= 14.0 ~  labs 4/11 showed Hg= 14.7 ~  Labs 5/12 in [place] showed Hg= 13.9-14.7   Past Surgical History  Procedure Date  . Laparoscopic cholecystectomy 01/2007    Dr. Odie Sera  . Abdominal hysterectomy   . Bilateral thr's   . Right total knee 11/1999    Dr. Leslee Home  . Left  femur fracture 04/2002    Dr. Ranell Patrick  . Lumbar fusion     Outpatient Encounter Prescriptions as of 03/30/2011  Medication Sig Dispense Refill  .  ALPRAZolam (XANAX) 0.5 MG tablet Take 1/2 to 1 tablet by mouth three times daily as needed for nerves       . amLODipine-valsartan (EXFORGE) 5-160 MG per tablet Take 1/2 tablet by mouth once daily      . aspirin 81 MG tablet Take 81 mg by mouth daily.        . clopidogrel (PLAVIX) 75 MG tablet Take 75 mg by mouth daily.        Marland Kitchen dextromethorphan-guaiFENesin (MUCINEX DM) 30-600 MG per 12 hr tablet Take 2 tablets by mouth every 12 (twelve) hours. With plenty of fluids       . donepezil (ARICEPT) 10 MG tablet Take 1 tablet (10 mg total) by mouth at bedtime as needed.  90 tablet  3  . fish oil-omega-3 fatty acids 1000 MG capsule Take 1 g by mouth daily.        . furosemide (LASIX) 40 MG tablet Take 40 mg by mouth daily.        Marland Kitchen glucosamine-chondroitin 500-400 MG tablet Take 1 tablet by mouth daily.        . isosorbide mononitrate (IMDUR) 30 MG 24 hr tablet TAKE 1/2 TABLET DAILY  45 tablet  3  . levothyroxine (SYNTHROID, LEVOTHROID) 75 MCG tablet TAKE 1 TABLET DAILY  90 tablet  3  . memantine (NAMENDA) 10 MG tablet Take 1 tablet (10 mg total) by mouth 2 (two) times daily.  180 tablet  3  . metoprolol (TOPROL-XL) 100 MG 24 hr tablet Take 1/2 tablet by mouth once daily      . mometasone (NASONEX) 50 MCG/ACT nasal spray Place 2 sprays into the nose daily.  17 g  11  . Multiple Vitamins-Minerals (CENTRUM SILVER PO) Take 1 tablet by mouth daily.        . potassium chloride SA (K-DUR,KLOR-CON) 20 MEQ tablet Take 20 mEq by mouth daily.        . simvastatin (ZOCOR) 40 MG tablet Take 40 mg by mouth at bedtime.        . vitamin D, CHOLECALCIFEROL, 400 UNITS tablet Take 400 Units by mouth daily.          Allergies  Allergen Reactions  . Morphine     Hallucinations   . Ofloxacin     Unable to remember    Review of Systems     See HPI - all other systems neg except as noted.    The patient complains of decreased hearing, dyspnea on exertion, muscle weakness, and difficulty walking.  The patient denies  anorexia, fever, weight loss, weight gain, vision loss, hoarseness, chest pain, syncope, peripheral edema, prolonged cough, headaches, hemoptysis, abdominal pain, melena, hematochezia, severe indigestion/heartburn, hematuria, incontinence, suspicious skin lesions, transient blindness, depression, unusual weight change, abnormal bleeding, enlarged lymph nodes, and angioedema.     Physical Exam   WD, Overweight, [name] in NAD... GENERAL:  Alert & oriented; pleasant & cooperative... HEENT:  [place]/AT, EOM-wnl, PERRLA, EACs-clear, TMs-wnl, NOSE-clear, THROAT-clear & wnl. NECK:  Supple w/ fairROM; no JVD; normal carotid impulses w/o bruits; no thyromegaly or nodules palpated; no lymphadenopathy. CHEST:  Clear to P & A; without wheezes/ rales/ or rhonchi heard... HEART:  Regular Rhythm; without murmurs/ rubs/ or gallops detected... ABDOMEN:  Soft & nontender; normal bowel sounds; no organomegaly or masses palpated.Marland KitchenMarland Kitchen  EXT:  mod arthritic changes; +varicose veins/ +venous insuffic/ 1+ edema;  intact pulses in LE's... NEURO:  CN's intact; no focal neuro deficit... DERM:  No lesions noted; no rash etc...   ASSESSMENT & PLAN:  HBP>  BPs improved w/ decr in doses to 1/2 tabs as above; keep same meds & monitor BP at home, call for problems  ASHD>  Stable on meds + IMDUR & her ASA/ PLAVIX;  Continue same meds...  Ven Insuffic>  Stable, at baseline w/ mild swelling esp late in the day;  Continue Rx...  CHOL>  Labs look good on Simva + Fish Oil; continue same...  Hypothy>  Stable on the Levo75...  GU>  She responded to Cipro previously, & last UA was clear...  DJD/ LBP>  She had epidural steroid injection from DrRamos in the past;  Currently using OTC meds prn...  ASPVD, TIA, ANEURYSM>    << See above >>  MEMORY LOSS>  On Aricept & we decided to add NAMENDA titrate up to 10mg  Bid; she will f/u w/ Neuro soon.Marland KitchenMarland Kitchen

## 2011-04-02 ENCOUNTER — Other Ambulatory Visit: Payer: Self-pay | Admitting: Pulmonary Disease

## 2011-04-02 DIAGNOSIS — I6529 Occlusion and stenosis of unspecified carotid artery: Secondary | ICD-10-CM

## 2011-04-03 ENCOUNTER — Encounter (INDEPENDENT_AMBULATORY_CARE_PROVIDER_SITE_OTHER): Payer: Medicare HMO | Admitting: *Deleted

## 2011-04-03 DIAGNOSIS — I6529 Occlusion and stenosis of unspecified carotid artery: Secondary | ICD-10-CM

## 2011-04-12 ENCOUNTER — Telehealth: Payer: Self-pay | Admitting: Pulmonary Disease

## 2011-04-12 NOTE — Telephone Encounter (Signed)
Called and spoke with pt's daughter.  Daughter scheduled pt to come in to get flu vaccine on 9/11 at noon.

## 2011-04-17 ENCOUNTER — Ambulatory Visit (INDEPENDENT_AMBULATORY_CARE_PROVIDER_SITE_OTHER): Payer: Medicare HMO

## 2011-04-17 ENCOUNTER — Telehealth: Payer: Self-pay | Admitting: Pulmonary Disease

## 2011-04-17 DIAGNOSIS — Z23 Encounter for immunization: Secondary | ICD-10-CM

## 2011-04-17 MED ORDER — ALPRAZOLAM 0.5 MG PO TABS: ORAL_TABLET | ORAL | Status: DC

## 2011-04-17 NOTE — Telephone Encounter (Signed)
I spoke with Mrs. Andrea Mccoy and is aware alprazolam was [facility]. Nothing further was needed.

## 2011-04-26 ENCOUNTER — Other Ambulatory Visit: Payer: Self-pay | Admitting: Pulmonary Disease

## 2011-05-04 LAB — DIFFERENTIAL
Eosinophils Relative: 2
Lymphs Abs: 2.2
Monocytes Absolute: 0.4
Neutro Abs: 4

## 2011-05-04 LAB — CBC
HCT: 42.3
MCHC: 34.4
MCV: 97.5
Platelets: 61 — ABNORMAL LOW
RDW: 13.5

## 2011-05-04 LAB — URINALYSIS, ROUTINE W REFLEX MICROSCOPIC
Ketones, ur: NEGATIVE
Nitrite: NEGATIVE
Protein, ur: NEGATIVE
pH: 6

## 2011-05-04 LAB — POCT CARDIAC MARKERS: Myoglobin, poc: 107

## 2011-05-04 LAB — POCT I-STAT, CHEM 8
BUN: 20
Calcium, Ion: 1.21
Glucose, Bld: 90
TCO2: 31

## 2011-05-15 LAB — URINE CULTURE

## 2011-05-15 LAB — HEPATIC FUNCTION PANEL
ALT: 14
Alkaline Phosphatase: 104
Bilirubin, Direct: 0.4 — ABNORMAL HIGH
Indirect Bilirubin: 0.8
Total Protein: 6.5

## 2011-05-15 LAB — POCT CARDIAC MARKERS
CKMB, poc: 1
CKMB, poc: <1 — ABNORMAL LOW
Myoglobin, poc: 101
Myoglobin, poc: 139
Myoglobin, poc: 146
Operator id: 198171
Operator id: 198171
Troponin i, poc: <0.05
Troponin i, poc: <0.05

## 2011-05-15 LAB — DIFFERENTIAL
Basophils Absolute: 0
Eosinophils Absolute: 0 — ABNORMAL LOW
Eosinophils Relative: 0
Monocytes Absolute: 0.5

## 2011-05-15 LAB — URINALYSIS, ROUTINE W REFLEX MICROSCOPIC
Glucose, UA: NEGATIVE
Protein, ur: 100 — AB
pH: 6

## 2011-05-15 LAB — CBC
HCT: 42.5
Hemoglobin: 14.5
MCV: 94.3
Platelets: 216
RDW: 13.1

## 2011-05-15 LAB — URINE MICROSCOPIC-ADD ON

## 2011-05-15 LAB — I-STAT 8, (EC8 V) (CONVERTED LAB)
Acid-Base Excess: 2
Bicarbonate: 27.1 — ABNORMAL HIGH
HCT: 46
Hemoglobin: 15.6 — ABNORMAL HIGH
Operator id: 198171
Sodium: 139
TCO2: 28
pCO2, Ven: 43.3 — ABNORMAL LOW

## 2011-05-15 LAB — PROTIME-INR: Prothrombin Time: 12

## 2011-05-24 LAB — URINALYSIS, ROUTINE W REFLEX MICROSCOPIC
Bilirubin Urine: NEGATIVE
Ketones, ur: NEGATIVE
Ketones, ur: NEGATIVE
Nitrite: POSITIVE — AB
Protein, ur: NEGATIVE
Specific Gravity, Urine: 1.02 (ref 1.005–1.035)
Urobilinogen, UA: 0.2
pH: 6

## 2011-05-24 LAB — COMPREHENSIVE METABOLIC PANEL
ALT: 29
AST: 29
Albumin: 3.7
CO2: 31
Chloride: 104
Creatinine, Ser: 0.94
GFR calc Af Amer: >60
Potassium: 4.3
Sodium: 142
Total Bilirubin: 0.8

## 2011-05-24 LAB — DIFFERENTIAL
Basophils Absolute: 0
Eosinophils Absolute: 0
Eosinophils Relative: 0
Lymphocytes Relative: 27
Monocytes Absolute: 0.7

## 2011-05-24 LAB — CBC
MCV: 94.8
Platelets: 320
RBC: 4.76
WBC: 9.2

## 2011-05-24 LAB — URINE CULTURE

## 2011-05-24 LAB — URINE MICROSCOPIC-ADD ON: RBC / HPF: NONE SEEN

## 2011-06-26 ENCOUNTER — Telehealth: Payer: Self-pay | Admitting: Pulmonary Disease

## 2011-06-26 NOTE — Telephone Encounter (Signed)
LMOM for Ms. Andrea Mccoy to return ouf call so we may verify which medications are needed. Also, the pt is due for follow-up and this needs to be scheduled.

## 2011-06-27 NOTE — Telephone Encounter (Signed)
lmomtcb for Ms. Andrea Mccoy

## 2011-07-02 MED ORDER — DONEPEZIL HCL 10 MG PO TABS: 10.0000 mg | ORAL_TABLET | Freq: Every evening | ORAL | Status: DC | PRN

## 2011-07-02 MED ORDER — ALPRAZOLAM 0.5 MG PO TABS: ORAL_TABLET | ORAL | Status: DC

## 2011-07-02 MED ORDER — MEMANTINE HCL 10 MG PO TABS: 10.0000 mg | ORAL_TABLET | Freq: Two times a day (BID) | ORAL | Status: DC

## 2011-07-02 MED ORDER — POTASSIUM CHLORIDE CRYS ER 20 MEQ PO TBCR: 20.0000 meq | EXTENDED_RELEASE_TABLET | Freq: Every day | ORAL | Status: DC

## 2011-07-02 MED ORDER — FUROSEMIDE 40 MG PO TABS: 40.0000 mg | ORAL_TABLET | Freq: Every day | ORAL | Status: DC

## 2011-07-02 NOTE — Telephone Encounter (Signed)
Pt's insurance has changed and her prescriptions need to be called into [facility] for a 90 day supply. Pt is due for a follow-up with SN and this was scheduled for Thurs,., 08/23/2011 @ 9:30am. Prescriptions sent to [facility]. Alprazolam printed for SN to sign and then fax to [facility] at [phone].

## 2011-07-06 ENCOUNTER — Telehealth: Payer: Self-pay | Admitting: Pulmonary Disease

## 2011-07-06 NOTE — Telephone Encounter (Signed)
Pt's daughter called again re: same. Wants a call back today. Hazel Sams

## 2011-07-06 NOTE — Telephone Encounter (Signed)
Called and spoke with Tamela Gammon from [employer] and also Dena, who is a rep for members services and was informed by both that Alprazolam is not covered by [facility] and their system would not list any alternatives that are covered.  SN will either need to do a review and appeals or change to an alternative. LMOM for pt's daughter TCB to inform her of the above information and does she want SN to change to an alternative or appeal?

## 2011-07-09 NOTE — Telephone Encounter (Signed)
Spoke with pt's daughter and notified of recs regarding alprazolam. She states does not want to go through the appeals process and would rather just call pharmacy and see how much med would cost out of pocket, as she still has some, and med will be covered again in 2013.

## 2011-08-08 ENCOUNTER — Telehealth: Payer: Self-pay | Admitting: Pulmonary Disease

## 2011-08-08 MED ORDER — CLOPIDOGREL BISULFATE 75 MG PO TABS: 75.0000 mg | ORAL_TABLET | Freq: Every day | ORAL | Status: DC

## 2011-08-08 MED ORDER — ALPRAZOLAM 0.5 MG PO TABS: ORAL_TABLET | ORAL | Status: DC

## 2011-08-08 MED ORDER — SIMVASTATIN 40 MG PO TABS: 40.0000 mg | ORAL_TABLET | Freq: Every day | ORAL | Status: DC

## 2011-08-08 MED ORDER — FUROSEMIDE 40 MG PO TABS: 40.0000 mg | ORAL_TABLET | Freq: Every day | ORAL | Status: DC

## 2011-08-08 MED ORDER — LEVOTHYROXINE SODIUM 75 MCG PO TABS: 75.0000 ug | ORAL_TABLET | Freq: Every day | ORAL | Status: DC

## 2011-08-08 NOTE — Telephone Encounter (Signed)
Called and spoke with pts daughter and she is aware of rx that have been faxed to pts new pharmacy.

## 2011-08-08 NOTE — Telephone Encounter (Signed)
rx for all meds have been printed and will get these signed by SN and fax to the new pharmacy.

## 2011-08-23 ENCOUNTER — Ambulatory Visit (INDEPENDENT_AMBULATORY_CARE_PROVIDER_SITE_OTHER): Payer: Medicare Other | Admitting: Pulmonary Disease

## 2011-08-23 ENCOUNTER — Encounter: Payer: Self-pay | Admitting: Pulmonary Disease

## 2011-08-23 DIAGNOSIS — N390 Urinary tract infection, site not specified: Secondary | ICD-10-CM

## 2011-08-23 DIAGNOSIS — I872 Venous insufficiency (chronic) (peripheral): Secondary | ICD-10-CM

## 2011-08-23 DIAGNOSIS — K5730 Diverticulosis of large intestine without perforation or abscess without bleeding: Secondary | ICD-10-CM

## 2011-08-23 DIAGNOSIS — E039 Hypothyroidism, unspecified: Secondary | ICD-10-CM

## 2011-08-23 DIAGNOSIS — R413 Other amnesia: Secondary | ICD-10-CM

## 2011-08-23 DIAGNOSIS — G459 Transient cerebral ischemic attack, unspecified: Secondary | ICD-10-CM

## 2011-08-23 DIAGNOSIS — E7800 Pure hypercholesterolemia, unspecified: Secondary | ICD-10-CM

## 2011-08-23 DIAGNOSIS — I2510 Atherosclerotic heart disease of native coronary artery without angina pectoris: Secondary | ICD-10-CM

## 2011-08-23 DIAGNOSIS — I10 Essential (primary) hypertension: Secondary | ICD-10-CM

## 2011-08-23 DIAGNOSIS — F411 Generalized anxiety disorder: Secondary | ICD-10-CM

## 2011-08-23 DIAGNOSIS — K219 Gastro-esophageal reflux disease without esophagitis: Secondary | ICD-10-CM

## 2011-08-23 DIAGNOSIS — M1990 Unspecified osteoarthritis, unspecified site: Secondary | ICD-10-CM

## 2011-08-23 NOTE — Progress Notes (Signed)
HPI  78 y/o WF here for a follow up visit... she has multiple medical problems as noted below...   ~  Dec 18, 2010: 357mo ROV & reasonably stable>  She saw DrRamos 11/11 for LBP w/ eval revealing multilevel DDD & she has an epid steriod injection w/ some temp benefit & she uses OTC meds for pain relief...  She had Cards f/u DrRoss 12/11 & was felt to be stable, no angina, no SOB, & no change in med Rx...  She had a Neuro eval from DrWillis 1/12 & he agreed w/ Aricept Rx & will consider adding Namenda later; he rec stopping the Amitriptyline & they feel her paresthesias are sl better off this med...  She was also treated for a UTI 3/12 by TP w/ a pansens EColi (given Cipro250 w/ resolution)...  ~  Jan 04, 2011:  2 week ROV & post-Hosp check> she was visiting a relative in the hosp 12/27/10 when she felt light headed & fainted briefly (fell back into a chair) then confused- taken to ER w/ syncope & Adm for 2d> CT Br showed atrophy, sm vessel dis, vasc calcif, NAD;  EEG was WNL;  CDopplers were neg x for mild plaque- no extracranial carotid dis;  EKG (in ER) w/ [id number], NSSTTWA;  2DEcho showed normal LVF (55-60%) & no wall motion abn, essent norm valves etc;  Labs were norm as well... Disch on same meds> since disch she's been sl weak & BP at home sl low (home health said ~100/50 & we decr her Exforge)...  We decided to cut back slightly on her BP Rx & monitor BP closely at home> rec to take 1/2 of the Exforge & 1/2 of the ToprolXL.  ~  January 25, 2011:  34mo ROV after we decr her Metoprolol & Exforge to 1/2 the dose due to weakness & low BP around 100;  Since then she is feeling better & home BP log shows BPs 120-130 range, BP today 112/62; she is doing balance exercises at home & slowly improving; they note that energy isn't good & daugh thinks she's depressed; she is on the wait list for meals on wheels; daughter checks on her every day; she takes Alpraz prn & asked to minimize this if depressed & offered  antidepressant but they decline... We will recheck 6-8 wks.  ~  March 30, 2011:  57mo ROV & they note BPs at home have been pretty good & review of log book shows 120-140/ 70-80 range; daughter note incr forgetful & she's on Aricept (refilled today) & review of DrWillis note indicated low threshold for adding Namenda & they are in agreement;  Otherwise stable> 122/76 today; denies CP, palpit, syncope, ch in DOE, edema; denies cerebral ischemic symptoms; reviewed FLP, TSH, other labs> stable, no changes made...  ~  August 23, 2011:  51mo ROV & her CC is that her fluid pill is too strong & she urinates too freq, want to decr the Lasix 40mg /d to 1/2 tab daily & we have given her a range of 1/2 to 1 tab daily based on BP & fluid retention problems... We reviewed her prev labs, XRays, and scans> she tells me that she has upcoming appts w/ DrRoss for Cards, DrNorris for Ortho, DrWillis for Neurology...  She saw DrWillis 8/12 w/ decr memory, numbness in hands & feet, syncopal spell ?etiology etc; rec wrist splint trial & NCVs showed  No evid for periph neuropathy & borderline right CTS.Marland KitchenMarland Kitchen  Problem List:  Hx of ASTHMATIC BRONCHITIS (ICD-466.0) - she is a never smoker;  breathing has been good & she denies cough, sputum, dyspnea, etc...  HYPERTENSION (ICD-401.9) - on TOPROL XL 100- 1/2 daily,  EXFORGE 5-160 1/2 daily,  LASIX 40mg /d, KCl 61mEq/d, & IMDUR 30mg - 1/2tab/d... ~  labs 4/11 showed normal BMet... & norm labs in ER 5/11 x BS=189. ~  11/11: BP= 116/80 & similar at home- notes reduced symptoms of lightheadedness & denies HA, visual changes, CP, palipit, syncope, edema, etc... ~  12/18/10:  BP= 134/78, feeling OK & denies symptoms... ~  01/04/11:  Post hosp check & BP 150/80 supine, 140/70 sitting, 130/70 standing (no symptoms)> decided to decr the ToprolXL & Exforge to 1/2 of each. ~  Since then BP checks at home & thru office have been good on above meds... ~  1/13:  BP= 122/72 & she has  presistant mult somatic complaints...  ARTERIOSCLEROTIC HEART DISEASE (ICD-414.00) - on IMDUR 30mg - 1/2 tab daily;  + ASA 81mg /d & PLAVIX 75mg /d...  ~  cath 1998 & 2003 w/ LAD lesion~50%, no other abn seen... ~  neg Myoview 11/08 w/o ischemia and EF=83%. ~  She follows up w/ DrRoss yearly... ~  saw DrRoss on 2/09 w/ review of 11/08 hosp for atypical CP...  doing satis without recurrent CP but too sedentary and difficulty getting around w/ her arthritis... ~  she had Cards f/u DrRoss 12/11 & was felt to be stable, no angina, no SOB, & no change in med Rx; EKG= NSR, WNL.Marland Kitchen. ~  5/12:  Hosp eval included> EKG (in ER) w/ [id number], NSSTTWA;  2DEcho showed normal LVF (55-60%) & no wall motion abn, essent norm valves etc;  VENOUS INSUFFICIENCY, CHRONIC (ICD-459.81) - she has chronic venous insuffic changes in her LE's w/ edema, bruising, etc;  ~  she knows to elim sodium, elevate legs, wear support hose when able, etc...  HYPERCHOLESTEROLEMIA (ICD-272.0) - on SIMVASTATIN 40mg /d & FISH OIL... ~  FLP 9/08 showed TChol 144, TG 204, HDL 34, LDL 72 ~  FLP 3/09 showed TChol 143, TG 178, HDL 33, LDL 74... rec- contin meds, incr exercise. ~  FLP 1/10 showed TChol 150, TG 162, HDL 39, LDL 79... DrRoss rec same meds, better diet. ~  FLP 7/10 showed TChol 143, TG 176, HDL 37, LDL 71 ~  FLP 4/11 showed TChol 154, TG 154, HDL 44, LDL 79 ~  FLP 3/12 showed TChol 150, TG 166, HDL 39, LDL 78 ~  FLP 8/12 showed TChol 143, TG 118, HDL 47, LDL 72  HYPOTHYROIDISM (ICD-244.9) - on LEVOTHYROID 17mcg/d... ~  labs 3/09 showed TSH= 0.98 ~  labs 7/10 showed TSH= 1.37 ~  labs 4/11 showed TSH= 1.39 ~  CDopplers 8/11 showed incidental thyroid nodule; subseq Thyroid Ultrasound showed mult bilat nodules & bx of dom lesion= non-neoplastic goiter> continue same med. ~  Labs 3/12 showed TSH= 1.16...  ~  Labs 5/12 in [place] showed TSH=0.74, FreeT3=81 (80-204), FreeT4=1.36 (0.80-1.80)  GERD (ICD-530.81) - not currently on PPI med &  advised PRILOSEC 20mg  OTC Prn... ~  EGD by [facility] 6/06 showed HH, mild gastritis... ~  last saw [facility] [place] Aug09 w/ Carafate added (off now).  DIVERTICULOSIS OF COLON (ICD-562.10) - followed by [facility] and last colonoscopy was 4/08 showing divertics, 2 sm polyps (tubular adenomas), & hems...  CHOLELITHIASIS (ICD-574.20) - seen on prev CTAbd 4/08... s/p lap chole 6/08 by DrHoxworth...  URINARY TRACT INFECTION, CHRONIC (ICD-599.0) - mult cultures in 2009  showed sens EColi & Rx w/ Cipro... we discussed the need for Urology eval if UTI's continue to recur (for further eval). ~  5/11:  ER eval for mult symptoms and found to have a UTI- treated w/ Bacterim & resolved. ~  3/12:  Saw TP w/ UTI from a pansens EColi & Rx w/ Cipro- resolved...  DEGENERATIVE JOINT DISEASE (ICD-715.90) - she has severe DJD w/ prev bilat THR, right TKR, and left femur fx w/ pinning 2003... c/o difficulty w/ ambulation, exercise, etc... she takes Glucoamine Bid + MVI etc... ~  labs 7/10 showed Vit D level = 31... rec> start Vit D 1000 u daily. ~  11/11:  c/o right leg pain & she saw DrRamos w/ eval revealing multilevel DDD & she has an epid steriod injection w/ some temp benefit. ~  5/12: she uses OTC meds for pain relief...  TIA (ICD-435.9) & INTRACRANIAL ANEURYSM (ICD-437.3) - had left side numbness 1996- saw DrWeymann> MRI was neg... Rx w/ ASA. ~  seen by TParrett 4/09 w/ numbness & paresthesias- norm sed, B12, folate... improved w/ reassurance. 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 PLAVIX 75mg /d added. ~  ER 5/11 & 7/11> facial numbness & tingling w/ nothing found & she decided to stop the Aricept... CT Br w/ atrophy, sm vessel dis, atherosclerotic changes, NAD.Marland Kitchen. subseq MRA for f/u sm left paraophthalmic art aneurysm was unchanged... CDopplers 8/11 showed mild calcif plaque  bilat, 0-39% bilat ICA stenoses, & incidental thyroid nodule. ~  ER & Hosp 5/12 for syncope> CT Br showed atrophy, sm vessel dis, vasc calcif, NAD;  EEG was WNL;  CDopplers were neg x for mild plaque- no extracranial carotid dis... ~  [facility]lers at [facility] 8/12 showed stable mild plaque, 0-39% bilat ICA stenoses, f/u rec 34yrs...  MEMORY LOSS (ICD-780.93) - started on ARICEPT 2/09 and sl better over time... she does her own meds w/ daughter checking on her... ~  10/11:  pt had stopped Aricept on her own & offered restart vs Neuro consult> she decided to restart. ~  She had a Neuro eval from DrWillis 1/12 & he agreed w/ Aricept Rx & will consider adding Namenda later; he rec stopping the Amitriptyline & they feel her paresthesias are sl better off this med... ~  8/12:  Daughter concerned for her memory & some agitation; rec to add NAMENDA 10mg  1==>2 per day & she has f/u w/ Neuro soon.  ANXIETY (ICD-300.00) - on ALPRAZOLAM 0.5mg  Bid Prn... ~  6/12: daughter thought she might be depressed & offered trial antidepressant med but they decline more meds at this time...  Hx of ANEMIA (ICD-285.9) - Hx of anemia in the past... Hg has remained WNL since then... ~  labs 7/10 showed Hg= 14.0 ~  labs 4/11 showed Hg= 14.7 ~  Labs 5/12 in [place] showed Hg= 13.9-14.7   Past Surgical History  Procedure Date  . Laparoscopic cholecystectomy 01/2007    Dr. Odie Sera  . Abdominal hysterectomy   . Bilateral thr's   . Right total knee 11/1999    Dr. Leslee Home  . Left femur fracture 04/2002    Dr. Ranell Patrick  . Lumbar fusion     Outpatient Encounter Prescriptions as of 08/23/2011  Medication Sig Dispense Refill  . ALPRAZolam (XANAX) 0.5 MG tablet Take 1/2 to 1 tablet by mouth three times daily as needed  for nerves  270 tablet  3  . amLODipine-valsartan (EXFORGE) 5-160 MG per tablet Take 1/2 tablet by mouth once daily      . aspirin 81 MG tablet Take 81 mg by mouth daily.        . clopidogrel (PLAVIX) 75 MG tablet  Take 1 tablet (75 mg total) by mouth daily.  90 tablet  3  . dextromethorphan-guaiFENesin (MUCINEX DM) 30-600 MG per 12 hr tablet Take 2 tablets by mouth every 12 (twelve) hours. With plenty of fluids       . donepezil (ARICEPT) 10 MG tablet Take 1 tablet (10 mg total) by mouth at bedtime as needed.  90 tablet  0  . fish oil-omega-3 fatty acids 1000 MG capsule Take 1 g by mouth daily.        . furosemide (LASIX) 40 MG tablet Take 1 tablet (40 mg total) by mouth daily.  90 tablet  3  . glucosamine-chondroitin 500-400 MG tablet Take 1 tablet by mouth daily.        . isosorbide mononitrate (IMDUR) 30 MG 24 hr tablet TAKE 1/2 TABLET DAILY  45 tablet  3  . levothyroxine (SYNTHROID, LEVOTHROID) 75 MCG tablet Take 1 tablet (75 mcg total) by mouth daily.  90 tablet  3  . memantine (NAMENDA) 10 MG tablet Take 1 tablet (10 mg total) by mouth 2 (two) times daily.  180 tablet  0  . metoprolol (TOPROL-XL) 100 MG 24 hr tablet Take 1/2 tablet by mouth once daily      . Multiple Vitamins-Minerals (CENTRUM SILVER PO) Take 1 tablet by mouth daily.        . potassium chloride SA (K-DUR,KLOR-CON) 20 MEQ tablet Take 1 tablet (20 mEq total) by mouth daily.  90 tablet  0  . simvastatin (ZOCOR) 40 MG tablet Take 1 tablet (40 mg total) by mouth at bedtime.  90 tablet  3  . vitamin D, CHOLECALCIFEROL, 400 UNITS tablet Take 400 Units by mouth daily.        Marland Kitchen DISCONTD: mometasone (NASONEX) 50 MCG/ACT nasal spray Place 2 sprays into the nose daily.  17 g  11    Allergies  Allergen Reactions  . Morphine     Hallucinations   . Ofloxacin     Unable to remember    Current Medications, Allergies, Past Medical History, Past Surgical History, Family History, and Social History were reviewed in [employer] record.    Review of Systems     See HPI - all other systems neg except as noted.    The patient complains of decreased hearing, dyspnea on exertion, muscle weakness, and difficulty walking.  The  patient denies anorexia, fever, weight loss, weight gain, vision loss, hoarseness, chest pain, syncope, peripheral edema, prolonged cough, headaches, hemoptysis, abdominal pain, melena, hematochezia, severe indigestion/heartburn, hematuria, incontinence, suspicious skin lesions, transient blindness, depression, unusual weight change, abnormal bleeding, enlarged lymph nodes, and angioedema.      Physical Exam   WD, Overweight, 78 y/o WF in NAD... GENERAL:  Alert & oriented; pleasant & cooperative... HEENT:  [place]/AT, EOM-wnl, PERRLA, EACs-clear, TMs-wnl, NOSE-clear, THROAT-clear & wnl. NECK:  Supple w/ fairROM; no JVD; normal carotid impulses w/o bruits; no thyromegaly or nodules palpated; no lymphadenopathy. CHEST:  Clear to P & A; without wheezes/ rales/ or rhonchi heard... HEART:  Regular Rhythm; without murmurs/ rubs/ or gallops detected... ABDOMEN:  Soft & nontender; normal bowel sounds; no organomegaly or masses palpated... EXT:  mod arthritic changes; +varicose  veins/ +venous insuffic/ 1+ edema;  intact pulses in LE's... NEURO:  CN's intact; no focal neuro deficit... DERM:  No lesions noted; no rash etc...  RADIOLOGY DATA:  Reviewed in the EPIC EMR & discussed w/ the patient...  LABORATORY DATA:  Reviewed in the EPIC EMR & discussed w/ the patient...     ASSESSMENT & PLAN:  HBP>  BPs improved w/ decr in doses to 1/2 tabs as above; keep same meds & monitor BP at home, call for problems  ASHD>  Stable on meds + IMDUR & her ASA/ PLAVIX;  Continue same meds...  Ven Insuffic>  Stable, at baseline w/ mild swelling esp late in the day;  Continue Rx...  CHOL>  Labs look good on Simva + Fish Oil; continue same...  Hypothy>  Stable on the Levo75...  GU>  She responded to Cipro previously, & last UA was clear...  DJD/ LBP>  She had epidural steroid injection from DrRamos in the past;  Currently using OTC meds prn...  ASPVD, TIA, ANEURYSM>    << See above >>  MEMORY LOSS>  On Aricept  & we decided to add NAMENDA titrate up to 10mg  Bid; she will f/u w/ Neuro soon...   Patient's Medications  New Prescriptions   No medications on file  Previous Medications   ALPRAZOLAM (XANAX) 0.5 MG TABLET    Take 1/2 to 1 tablet by mouth three times daily as needed for nerves   AMLODIPINE-VALSARTAN (EXFORGE) 5-160 MG PER TABLET    Take 1/2 tablet by mouth once daily   ASPIRIN 81 MG TABLET    Take 81 mg by mouth daily.     CLOPIDOGREL (PLAVIX) 75 MG TABLET    Take 1 tablet (75 mg total) by mouth daily.   DEXTROMETHORPHAN-GUAIFENESIN (MUCINEX DM) 30-600 MG PER 12 HR TABLET    Take 2 tablets by mouth every 12 (twelve) hours. With plenty of fluids    DONEPEZIL (ARICEPT) 10 MG TABLET    Take 1 tablet (10 mg total) by mouth at bedtime as needed.   FISH OIL-OMEGA-3 FATTY ACIDS 1000 MG CAPSULE    Take 1 g by mouth daily.     FUROSEMIDE (LASIX) 40 MG TABLET    Take 1 tablet (40 mg total) by mouth daily.   GLUCOSAMINE-CHONDROITIN 500-400 MG TABLET    Take 1 tablet by mouth daily.     ISOSORBIDE MONONITRATE (IMDUR) 30 MG 24 HR TABLET    TAKE 1/2 TABLET DAILY   LEVOTHYROXINE (SYNTHROID, LEVOTHROID) 75 MCG TABLET    Take 1 tablet (75 mcg total) by mouth daily.   MEMANTINE (NAMENDA) 10 MG TABLET    Take 1 tablet (10 mg total) by mouth 2 (two) times daily.   METOPROLOL (TOPROL-XL) 100 MG 24 HR TABLET    Take 1/2 tablet by mouth once daily   MULTIPLE VITAMINS-MINERALS (CENTRUM SILVER PO)    Take 1 tablet by mouth daily.     POTASSIUM CHLORIDE SA (K-DUR,KLOR-CON) 20 MEQ TABLET    Take 1 tablet (20 mEq total) by mouth daily.   SIMVASTATIN (ZOCOR) 40 MG TABLET    Take 1 tablet (40 mg total) by mouth at bedtime.   VITAMIN D, CHOLECALCIFEROL, 400 UNITS TABLET    Take 400 Units by mouth daily.    Modified Medications   No medications on file  Discontinued Medications   MOMETASONE (NASONEX) 50 MCG/ACT NASAL SPRAY    Place 2 sprays into the nose daily.

## 2011-08-23 NOTE — Patient Instructions (Signed)
Today we updated your med list in our EPIC system...    Continue your current medications the same...     We discussed trying to cut back slowly on your fluid pill (LASIX/ Furosemide) from one tab daily to 1-1/2-1-1/2 every other day & monitor your urination & BP...  If able you may decrease further to 1/2 tab every day as long as BP remains good & swelling doesn't get worse...  Call for any questions...  Let's plan a follow up visit in 4-6 months w/ FASTING blood work at that time.Marland KitchenMarland Kitchen

## 2011-08-27 ENCOUNTER — Encounter: Payer: Self-pay | Admitting: Internal Medicine

## 2011-08-27 ENCOUNTER — Ambulatory Visit (INDEPENDENT_AMBULATORY_CARE_PROVIDER_SITE_OTHER): Payer: Medicare Other | Admitting: Internal Medicine

## 2011-08-27 VITALS — BP 123/72 | HR 65 | Ht 62.0 in | Wt 181.0 lb

## 2011-08-27 DIAGNOSIS — G459 Transient cerebral ischemic attack, unspecified: Secondary | ICD-10-CM

## 2011-08-27 DIAGNOSIS — I10 Essential (primary) hypertension: Secondary | ICD-10-CM

## 2011-08-27 DIAGNOSIS — I2510 Atherosclerotic heart disease of native coronary artery without angina pectoris: Secondary | ICD-10-CM

## 2011-08-27 DIAGNOSIS — E785 Hyperlipidemia, unspecified: Secondary | ICD-10-CM

## 2011-08-27 NOTE — Progress Notes (Signed)
HPIPatient is a 78 year old with a history of CAD (cath in 2003, myoview in 2008, normal). Also a history of HTN, dyslipididemia, TIA. I saw hier in clinic 1 year ago. SInce seen she has done OK from a cardiac standpoint. No CP  No dizziness  Breathing is OK.   Allergies  Allergen Reactions  . Morphine     Hallucinations   . Ofloxacin     Unable to remember    Current Outpatient Prescriptions  Medication Sig Dispense Refill  . ALPRAZolam (XANAX) 0.5 MG tablet Take 1/2 to 1 tablet by mouth three times daily as needed for nerves  270 tablet  3  . amLODipine-valsartan (EXFORGE) 5-160 MG per tablet Take 1/2 tablet by mouth once daily      . aspirin 81 MG tablet Take 81 mg by mouth daily.        . clopidogrel (PLAVIX) 75 MG tablet Take 1 tablet (75 mg total) by mouth daily.  90 tablet  3  . dextromethorphan-guaiFENesin (MUCINEX DM) 30-600 MG per 12 hr tablet Take 2 tablets by mouth every 12 (twelve) hours. With plenty of fluids       . donepezil (ARICEPT) 10 MG tablet Take 1 tablet (10 mg total) by mouth at bedtime as needed.  90 tablet  0  . fish oil-omega-3 fatty acids 1000 MG capsule Take 1 g by mouth daily.        . furosemide (LASIX) 40 MG tablet Take 1 tablet (40 mg total) by mouth daily.  90 tablet  3  . glucosamine-chondroitin 500-400 MG tablet Take 1 tablet by mouth daily.        . isosorbide mononitrate (IMDUR) 30 MG 24 hr tablet TAKE 1/2 TABLET DAILY  45 tablet  3  . levothyroxine (SYNTHROID, LEVOTHROID) 75 MCG tablet Take 1 tablet (75 mcg total) by mouth daily.  90 tablet  3  . memantine (NAMENDA) 10 MG tablet Take 1 tablet (10 mg total) by mouth 2 (two) times daily.  180 tablet  0  . metoprolol (TOPROL-XL) 100 MG 24 hr tablet Take 1/2 tablet by mouth once daily      . Multiple Vitamins-Minerals (CENTRUM SILVER PO) Take 1 tablet by mouth daily.        . potassium chloride SA (K-DUR,KLOR-CON) 20 MEQ tablet Take 1 tablet (20 mEq total) by mouth daily.  90 tablet  0  . simvastatin  (ZOCOR) 40 MG tablet Take 1 tablet (40 mg total) by mouth at bedtime.  90 tablet  3  . vitamin D, CHOLECALCIFEROL, 400 UNITS tablet Take 400 Units by mouth daily.          Past Medical History  Diagnosis Date  . Allergic rhinitis   . Asthmatic bronchitis   . Hypertension   . Arteriosclerotic heart disease   . Venous insufficiency (chronic) (peripheral)   . Hypercholesteremia   . Hypothyroid   . Nontoxic multinodular goiter   . GERD (gastroesophageal reflux disease)   . Diverticulosis of colon   . Cholelithiasis   . Chronic UTI   . DJD (degenerative joint disease)   . TIA (transient ischemic attack)   . Memory loss   . Intracranial aneurysm   . Anxiety   . Anemia     Past Surgical History  Procedure Date  . Laparoscopic cholecystectomy 01/2007    Dr. Odie Sera  . Abdominal hysterectomy   . Bilateral thr's   . Right total knee 11/1999    Dr. Leslee Home  .  Left femur fracture 04/2002    Dr. Ranell Patrick  . Lumbar fusion     No family history on file.  History   Social History  . Marital Status: Widowed    Spouse Name: N/A    Number of Children: 2  . Years of Education: N/A   Occupational History  . retired    Social History Main Topics  . Smoking status: Never Smoker   . Smokeless tobacco: Never Used  . Alcohol Use: No  . Drug Use: No  . Sexually Active: Not on file   Other Topics Concern  . Not on file   Social History Narrative  . No narrative on file    Review of Systems:  All systems reviewed.  They are negative to the above problem except as previously stated.  Vital Signs: BP 123/72  Pulse 65  Ht 5\' 2"  (1.575 m)  Wt 181 lb (82.101 kg)  BMI 33.11 kg/m2  Physical Exam Patient is in NAD HEENT:  Normocephalic, atraumatic. EOMI, PERRLA.  Neck: JVP is normal. No bruits.  Lungs: clear to auscultation. No rales no wheezes.  Heart: Regular rate and rhythm. Normal S1, S2. No S3.   No significant murmurs. PMI not displaced.  Abdomen:  Supple, nontender.  Normal bowel sounds. No masses. No hepatomegaly.  Extremities:  . No lower extremity edema.  Musculoskeletal :moving all extremities.  .  EKG:  Sinus rhythm  65 bpm.   Assessment and Plan:

## 2011-08-27 NOTE — Patient Instructions (Signed)
Your physician wants you to follow-up in:  12 months.  You will receive a reminder letter in the mail two months in advance. If you don't receive a letter, please call our office to schedule the follow-up appointment.   

## 2011-08-28 DIAGNOSIS — E785 Hyperlipidemia, unspecified: Secondary | ICD-10-CM | POA: Insufficient documentation

## 2011-08-28 DIAGNOSIS — I2510 Atherosclerotic heart disease of native coronary artery without angina pectoris: Secondary | ICD-10-CM | POA: Insufficient documentation

## 2011-08-28 NOTE — Assessment & Plan Note (Signed)
No symptoms of angina 

## 2011-08-28 NOTE — Assessment & Plan Note (Signed)
Keep on current regimen.  GOod control.  Tolerating.

## 2011-08-28 NOTE — Assessment & Plan Note (Signed)
Keep on asa.  Will review plavix.

## 2011-08-28 NOTE — Assessment & Plan Note (Signed)
BP is uner good control.

## 2011-09-26 ENCOUNTER — Encounter: Payer: Self-pay | Admitting: Pulmonary Disease

## 2011-10-09 ENCOUNTER — Other Ambulatory Visit: Payer: Self-pay | Admitting: Pulmonary Disease

## 2011-10-09 MED ORDER — MEMANTINE HCL 10 MG PO TABS: 10.0000 mg | ORAL_TABLET | Freq: Two times a day (BID) | ORAL | Status: DC

## 2011-10-11 ENCOUNTER — Telehealth: Payer: Self-pay | Admitting: Pulmonary Disease

## 2011-10-11 MED ORDER — ALPRAZOLAM 0.5 MG PO TABS: ORAL_TABLET | ORAL | Status: DC

## 2011-10-11 MED ORDER — LEVOTHYROXINE SODIUM 75 MCG PO TABS: 75.0000 ug | ORAL_TABLET | Freq: Every day | ORAL | Status: DC

## 2011-10-11 MED ORDER — ISOSORBIDE MONONITRATE ER 30 MG PO TB24: ORAL_TABLET | ORAL | Status: DC

## 2011-10-11 MED ORDER — MEMANTINE HCL 10 MG PO TABS: 10.0000 mg | ORAL_TABLET | Freq: Two times a day (BID) | ORAL | Status: DC

## 2011-10-11 MED ORDER — FUROSEMIDE 40 MG PO TABS: 40.0000 mg | ORAL_TABLET | Freq: Every day | ORAL | Status: DC

## 2011-10-11 MED ORDER — POTASSIUM CHLORIDE CRYS ER 20 MEQ PO TBCR: 20.0000 meq | EXTENDED_RELEASE_TABLET | Freq: Every day | ORAL | Status: DC

## 2011-10-11 NOTE — Telephone Encounter (Signed)
Pt was last seen in Jan

## 2011-10-11 NOTE — Telephone Encounter (Signed)
rx have been printed out and placed on SN cart to be signed.  We will fax these once this has been completed

## 2011-10-15 ENCOUNTER — Telehealth: Payer: Self-pay | Admitting: Pulmonary Disease

## 2011-10-15 NOTE — Telephone Encounter (Signed)
Spoke to daughter and the rx's that was sent to the pharmacy on 10/11/11 went to [facility] and should have went to [facility] please reprint and send to the correct pharmacy

## 2011-10-16 MED ORDER — MEMANTINE HCL 10 MG PO TABS: 10.0000 mg | ORAL_TABLET | Freq: Two times a day (BID) | ORAL | Status: DC

## 2011-10-16 MED ORDER — ALPRAZOLAM 0.5 MG PO TABS: ORAL_TABLET | ORAL | Status: DC

## 2011-10-16 MED ORDER — FUROSEMIDE 40 MG PO TABS: 40.0000 mg | ORAL_TABLET | Freq: Every day | ORAL | Status: DC

## 2011-10-16 MED ORDER — POTASSIUM CHLORIDE CRYS ER 20 MEQ PO TBCR: 20.0000 meq | EXTENDED_RELEASE_TABLET | Freq: Every day | ORAL | Status: DC

## 2011-10-16 MED ORDER — ISOSORBIDE MONONITRATE ER 30 MG PO TB24: ORAL_TABLET | ORAL | Status: DC

## 2011-10-16 MED ORDER — SIMVASTATIN 40 MG PO TABS: 40.0000 mg | ORAL_TABLET | Freq: Every day | ORAL | Status: DC

## 2011-10-16 MED ORDER — LEVOTHYROXINE SODIUM 75 MCG PO TABS: 75.0000 ug | ORAL_TABLET | Freq: Every day | ORAL | Status: DC

## 2011-10-16 MED ORDER — CLOPIDOGREL BISULFATE 75 MG PO TABS: 75.0000 mg | ORAL_TABLET | Freq: Every day | ORAL | Status: DC

## 2011-10-16 NOTE — Telephone Encounter (Signed)
Called and spoke with Andrea Mccoy and she confirmed the pharamacy is [facility].  These rx have been sent to her pharmacy.

## 2011-10-16 NOTE — Telephone Encounter (Signed)
Have reprinted all rx and will have SN sign these and fax them to [facility].

## 2011-11-13 ENCOUNTER — Telehealth: Payer: Self-pay | Admitting: Pulmonary Disease

## 2011-11-13 NOTE — Telephone Encounter (Addendum)
I spoke with the pt daughter and she states over the last 2 days the pt BP has been lower then usual. Yesterday in the evening it was 102/60 and this afternoon around 2pm it was 98/53. Pt denies any lightheadedness, dizziness, weakness, or vision disturbances. According to last OV note on 08-20-11, SN advised they could try decreasing the pt lasix gradually, but pt daughter states they never did this, so pt is taking lasix 40mg  daily. Daughter wanted to know are these readings too low for the pt? Please advise. Carron Curie, CMA Allergies  Allergen Reactions  . Morphine     Hallucinations   . Ofloxacin     Unable to remember

## 2011-11-13 NOTE — Telephone Encounter (Signed)
Juanetta Gosling is aware of recs from TP.

## 2011-11-13 NOTE — Telephone Encounter (Signed)
Per TP: no symptoms, would not change rx.  BP looks great.

## 2011-12-13 ENCOUNTER — Encounter: Payer: Self-pay | Admitting: Pulmonary Disease

## 2011-12-13 ENCOUNTER — Other Ambulatory Visit: Payer: Medicare Other

## 2011-12-13 ENCOUNTER — Ambulatory Visit: Payer: Medicare Other | Admitting: Pulmonary Disease

## 2011-12-13 ENCOUNTER — Ambulatory Visit (INDEPENDENT_AMBULATORY_CARE_PROVIDER_SITE_OTHER): Payer: Medicare Other | Admitting: Pulmonary Disease

## 2011-12-13 ENCOUNTER — Other Ambulatory Visit (INDEPENDENT_AMBULATORY_CARE_PROVIDER_SITE_OTHER): Payer: Medicare Other

## 2011-12-13 VITALS — BP 142/70 | HR 74 | Temp 97.1°F | Ht 63.5 in | Wt 184.0 lb

## 2011-12-13 DIAGNOSIS — M1990 Unspecified osteoarthritis, unspecified site: Secondary | ICD-10-CM

## 2011-12-13 DIAGNOSIS — I671 Cerebral aneurysm, nonruptured: Secondary | ICD-10-CM

## 2011-12-13 DIAGNOSIS — I83893 Varicose veins of bilateral lower extremities with other complications: Secondary | ICD-10-CM

## 2011-12-13 DIAGNOSIS — E039 Hypothyroidism, unspecified: Secondary | ICD-10-CM

## 2011-12-13 DIAGNOSIS — K5730 Diverticulosis of large intestine without perforation or abscess without bleeding: Secondary | ICD-10-CM

## 2011-12-13 DIAGNOSIS — E7800 Pure hypercholesterolemia, unspecified: Secondary | ICD-10-CM

## 2011-12-13 DIAGNOSIS — K219 Gastro-esophageal reflux disease without esophagitis: Secondary | ICD-10-CM

## 2011-12-13 DIAGNOSIS — F411 Generalized anxiety disorder: Secondary | ICD-10-CM

## 2011-12-13 DIAGNOSIS — I10 Essential (primary) hypertension: Secondary | ICD-10-CM

## 2011-12-13 DIAGNOSIS — I83891 Varicose veins of right lower extremities with other complications: Secondary | ICD-10-CM

## 2011-12-13 DIAGNOSIS — I2510 Atherosclerotic heart disease of native coronary artery without angina pectoris: Secondary | ICD-10-CM

## 2011-12-13 DIAGNOSIS — I872 Venous insufficiency (chronic) (peripheral): Secondary | ICD-10-CM

## 2011-12-13 DIAGNOSIS — G459 Transient cerebral ischemic attack, unspecified: Secondary | ICD-10-CM

## 2011-12-13 DIAGNOSIS — R413 Other amnesia: Secondary | ICD-10-CM

## 2011-12-13 LAB — CBC WITH DIFFERENTIAL/PLATELET
Basophils Relative: 0.3 % (ref 0.0–3.0)
Eosinophils Relative: 1.9 % (ref 0.0–5.0)
HCT: 38.4 % (ref 36.0–46.0)
Monocytes Relative: 7.1 % (ref 3.0–12.0)
Neutrophils Relative %: 67.9 % (ref 43.0–77.0)
Platelets: 176 10*3/uL (ref 150.0–400.0)
RBC: 3.9 Mil/uL (ref 3.87–5.11)
WBC: 6.3 10*3/uL (ref 4.5–10.5)

## 2011-12-13 LAB — LIPID PANEL
Cholesterol: 143 mg/dL (ref 0–200)
HDL: 50.2 mg/dL (ref >39.00)
LDL Cholesterol: 71 mg/dL (ref 0–99)
Triglycerides: 107 mg/dL (ref 0.0–149.0)
VLDL: 21.4 mg/dL (ref 0.0–40.0)

## 2011-12-13 LAB — BASIC METABOLIC PANEL
BUN: 28 mg/dL — ABNORMAL HIGH (ref 6–23)
Creatinine, Ser: 1.1 mg/dL (ref 0.4–1.2)
GFR: 50.58 mL/min — ABNORMAL LOW (ref >60.00)
Glucose, Bld: 110 mg/dL — ABNORMAL HIGH (ref 70–99)
Potassium: 4.6 mEq/L (ref 3.5–5.1)

## 2011-12-13 LAB — SEDIMENTATION RATE: Sed Rate: 24 mm/hr — ABNORMAL HIGH (ref 0–22)

## 2011-12-13 LAB — HEPATIC FUNCTION PANEL
AST: 26 U/L (ref 0–37)
Albumin: 3.7 g/dL (ref 3.5–5.2)
Total Bilirubin: 1.1 mg/dL (ref 0.3–1.2)

## 2011-12-13 LAB — TSH: TSH: 1.22 u[IU]/mL (ref 0.35–5.50)

## 2011-12-13 NOTE — Progress Notes (Addendum)
HPI  78 y/o WF here for a follow up visit... she has multiple medical problems as noted below...   ~  Dec 18, 2010: 211mo ROV & reasonably stable>  She saw DrRamos 11/11 for LBP w/ eval revealing multilevel DDD & she has an epid steriod injection w/ some temp benefit & she uses OTC meds for pain relief...  She had Cards f/u DrRoss 12/11 & was felt to be stable, no angina, no SOB, & no change in med Rx...  She had a Neuro eval from DrWillis 1/12 & he agreed w/ Aricept Rx & will consider adding Namenda later; he rec stopping the Amitriptyline & they feel her paresthesias are sl better off this med...  She was also treated for a UTI 3/12 by TP w/ a pansens EColi (given Cipro250 w/ resolution)...  ~  Jan 04, 2011:  2 week ROV & post-Hosp check> she was visiting a relative in the hosp 12/27/10 when she felt light headed & fainted briefly (fell back into a chair) then confused- taken to ER w/ syncope & Adm for 2d> CT Br showed atrophy, sm vessel dis, vasc calcif, NAD;  EEG was WNL;  CDopplers were neg x for mild plaque- no extracranial carotid dis;  EKG (in ER) w/ [id number], NSSTTWA;  2DEcho showed normal LVF (55-60%) & no wall motion abn, essent norm valves etc;  Labs were norm as well... Disch on same meds> since disch she's been sl weak & BP at home sl low (home health said ~100/50 & we decr her Exforge)...  We decided to cut back slightly on her BP Rx & monitor BP closely at home> rec to take 1/2 of the Exforge & 1/2 of the ToprolXL.  ~  January 25, 2011:  79mo ROV after we decr her Metoprolol & Exforge to 1/2 the dose due to weakness & low BP around 100;  Since then she is feeling better & home BP log shows BPs 120-130 range, BP today 112/62; she is doing balance exercises at home & slowly improving; they note that energy isn't good & daugh thinks she's depressed; she is on the wait list for meals on wheels; daughter checks on her every day; she takes Alpraz prn & asked to minimize this if depressed & offered  antidepressant but they decline... We will recheck 6-8 wks.  ~  March 30, 2011:  58mo ROV & they note BPs at home have been pretty good & review of log book shows 120-140/ 70-80 range; daughter note incr forgetful & she's on Aricept (refilled today) & review of DrWillis note indicated low threshold for adding Namenda & they are in agreement;  Otherwise stable> 122/76 today; denies CP, palpit, syncope, ch in DOE, edema; denies cerebral ischemic symptoms; reviewed FLP, TSH, other labs> stable, no changes made...  ~  August 23, 2011:  11mo ROV & her CC is that her fluid pill is too strong & she urinates too freq, want to decr the Lasix 40mg /d to 1/2 tab daily & we have given her a range of 1/2 to 1 tab daily based on BP & fluid retention problems... We reviewed her prev labs, XRays, and scans> she tells me that she has upcoming appts w/ DrRoss for Cards, DrNorris for Ortho, DrWillis for Neurology...  She saw DrWillis 8/12 w/ decr memory, numbness in hands & feet, syncopal spell ?etiology etc; rec wrist splint trial & NCVs showed  No evid for periph neuropathy & borderline right CTS...  ~  Dec 13, 2011:  24mo ROV & Lashell noted some recent dizziness & fell off her porch w/ mult bruises; she notes BP has been ok on meds & we cut the Lasix down to 1/2 tab Qam;  Since then her right leg has swollen > than left leg & we discussed checking VenDopplers to be sure there is no DVT==> Neg.    She saw DrRoss for Cards 1/13> f/u HBP, CAD, Dyslipidemia- felt to be doing ok & no changes made...    She also had f/u DrWillis for Neurology 2/13> Hx memory impairment (tolerating Aricept & Namenda well), prob CTS (try wrist splints); She had CTBrain 5/12 showing Atrophy, sm vessel dis, vasc calcif, no acute changes... LABS 5/13:  FLP- at goals on Simva40;  Chems- essen wnl;  CBC- wnl;  TSH=1.22;  Mg=2.0, Sed=24... Venous Dopplers 5/13:  Neg- no DVT or superficial thrombophlebitis...        Problem List:  Hx of ASTHMATIC  BRONCHITIS (ICD-466.0) - she is a never smoker;  breathing has been good & she denies cough, sputum, dyspnea, etc... ~  CXR 5/11 showed normal heart size w/ calcif in Ao, clear lungs x for bibasilar scarring/ atx, scoliosis & degen changes in spine...  HYPERTENSION (ICD-401.9) - on TOPROL XL 100- 1/2 daily,  EXFORGE 5-160 1/2 daily,  LASIX 40mg /d, KCl 67mEq/d, & IMDUR 30mg - 1/2tab/d... ~  labs 4/11 showed normal BMet... & norm labs in ER 5/11 x BS=189. ~  11/11: BP= 116/80 & similar at home- notes reduced symptoms of lightheadedness & denies HA, visual changes, CP, palipit, syncope, edema, etc... ~  12/18/10:  BP= 134/78, feeling OK & denies symptoms... ~  01/04/11:  Post hosp check & BP 150/80 supine, 140/70 sitting, 130/70 standing (no symptoms)> decided to decr the ToprolXL & Exforge to 1/2 of each. ~  Since then BP checks at home & thru office have been good on above meds... ~  1/13:  BP= 122/72 & she has presistant mult somatic complaints... ~  5/13:  BP= 142/70 & she appears stable w/ mult minor complaints...  ARTERIOSCLEROTIC HEART DISEASE (ICD-414.00) - on IMDUR 30mg - 1/2 tab daily;  + ASA 81mg /d & PLAVIX 75mg /d...  ~  cath 1998 & 2003 w/ LAD lesion~50%, no other abn seen... ~  neg Myoview 11/08 w/o ischemia and EF=83%. ~  She follows up w/ DrRoss yearly... ~  saw DrRoss on 2/09 w/ review of 11/08 hosp for atypical CP...  doing satis without recurrent CP but too sedentary and difficulty getting around w/ her arthritis... ~  she had Cards f/u DrRoss 12/11 & was felt to be stable, no angina, no SOB, & no change in med Rx; EKG= NSR, WNL.Marland Kitchen. ~  5/12:  Hosp eval included> EKG (in ER) w/ [id number], NSSTTWA;  2DEcho showed normal LVF (55-60%) & no wall motion abn, essent norm valves etc;  VENOUS INSUFFICIENCY, CHRONIC (ICD-459.81) - she has chronic venous insuffic changes in her LE's w/ edema, bruising, etc;  ~  she knows to elim sodium, elevate legs, wear support hose when able,  etc...  HYPERCHOLESTEROLEMIA (ICD-272.0) - on SIMVASTATIN 40mg /d & FISH OIL... ~  FLP 9/08 showed TChol 144, TG 204, HDL 34, LDL 72 ~  FLP 3/09 showed TChol 143, TG 178, HDL 33, LDL 74... rec- contin meds, incr exercise. ~  FLP 1/10 showed TChol 150, TG 162, HDL 39, LDL 79... DrRoss rec same meds, better diet. ~  FLP 7/10 showed TChol 143, TG 176, HDL 37, LDL  71 ~  FLP 4/11 showed TChol 154, TG 154, HDL 44, LDL 79 ~  FLP 3/12 showed TChol 150, TG 166, HDL 39, LDL 78 ~  FLP 8/12 on Simva40 showed TChol 143, TG 118, HDL 47, LDL 72 ~  FLP 5/13 on Simva40 showed TChol 143, TG 107, HDL 50, LDL 71  HYPOTHYROIDISM (ICD-244.9) - on LEVOTHYROID 65mcg/d... ~  labs 3/09 showed TSH= 0.98 ~  labs 7/10 showed TSH= 1.37 ~  labs 4/11 showed TSH= 1.39 ~  CDopplers 8/11 showed incidental thyroid nodule; subseq Thyroid Ultrasound showed mult bilat nodules & bx of dom lesion= non-neoplastic goiter> continue same med. ~  Labs 3/12 showed TSH= 1.16...  ~  Labs 5/12 in [place] showed TSH=0.74, FreeT3=81 (80-204), FreeT4=1.36 (0.80-1.80) ~  Labs 5/13 showed TSH= 1.22  GERD (ICD-530.81) - not currently on PPI med & advised PRILOSEC 20mg  OTC Prn... ~  EGD by [facility] 6/06 showed HH, mild gastritis... ~  last saw [facility] Aug09 w/ Carafate added (off now).  DIVERTICULOSIS OF COLON (ICD-562.10) - followed by [facility] and last colonoscopy was 4/08 showing divertics, 2 sm polyps (tubular adenomas), & hems...  CHOLELITHIASIS (ICD-574.20) - seen on prev CTAbd 4/08... s/p lap chole 6/08 by DrHoxworth...  URINARY TRACT INFECTION, CHRONIC (ICD-599.0) - mult cultures in 2009 showed sens EColi & Rx w/ Cipro... we discussed the need for Urology eval if UTI's continue to recur (for further eval). ~  5/11:  ER eval for mult symptoms and found to have a UTI- treated w/ Bacterim & resolved. ~  3/12:  Saw TP w/ UTI from a pansens EColi & Rx w/ Cipro- resolved...  DEGENERATIVE JOINT DISEASE (ICD-715.90) - she has severe DJD w/ prev  bilat THR, right TKR, and left femur fx w/ pinning 2003... c/o difficulty w/ ambulation, exercise, etc... she takes Glucoamine Bid + MVI etc... ~  labs 7/10 showed Vit D level = 31... rec> start Vit D 1000 u daily. ~  11/11:  c/o right leg pain & she saw DrRamos w/ eval revealing multilevel DDD & she has an epid steriod injection w/ some temp benefit. ~  5/12: she uses OTC meds for pain relief...  TIA (ICD-435.9) & INTRACRANIAL ANEURYSM (ICD-437.3) - had left side numbness 1996- saw DrWeymann> MRI was neg... Rx w/ ASA. ~  seen by TParrett 4/09 w/ numbness & paresthesias- norm sed, B12, folate... improved w/ reassurance. 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 PLAVIX 75mg /d added. ~  ER 5/11 & 7/11> facial numbness & tingling w/ nothing found & she decided to stop the Aricept... CT Br w/ atrophy, sm vessel dis, atherosclerotic changes, NAD.Marland Kitchen. subseq MRA for f/u sm left paraophthalmic art aneurysm was unchanged... CDopplers 8/11 showed mild calcif plaque bilat, 0-39% bilat ICA stenoses, & incidental thyroid nodule. ~  ER & Hosp 5/12 for syncope> CT Br showed atrophy, sm vessel dis, vasc calcif, NAD;  EEG was WNL;  CDopplers were neg x for mild plaque- no extracranial carotid dis... ~  [facility]opplers at [facility] 8/12 showed stable mild plaque, 0-39% bilat ICA stenoses, f/u rec 30yrs...  MEMORY LOSS (ICD-780.93) - started on ARICEPT 2/09 and sl better over time... she does her own meds w/ daughter checking on her... ~  10/11:  pt had stopped Aricept on her own & offered restart vs Neuro consult> she decided to restart. ~  She  had a Neuro eval from DrWillis 1/12 & he agreed w/ Aricept Rx & will consider adding Namenda later; he rec stopping the Amitriptyline & they feel her paresthesias are sl better off this med... ~  8/12:  Daughter concerned for her memory & some  agitation; rec to add NAMENDA 10mg  1==>2 per day & she has f/u w/ Neuro soon. ~  8/12:  She had f/u DrWillis and stable on Aricept & Namenda; no changes made...  ANXIETY (ICD-300.00) - on ALPRAZOLAM 0.5mg  Bid Prn... ~  6/12: daughter thought she might be depressed & offered trial antidepressant med but they decline more meds at this time...  Hx of ANEMIA (ICD-285.9) - Hx of anemia in the past... Hg has remained WNL since then... ~  labs 7/10 showed Hg= 14.0 ~  labs 4/11 showed Hg= 14.7 ~  Labs 5/12 in [place] showed Hg= 13.9-14.7 ~  Labs 5/13 showed Hg= 12.9   Past Surgical History  Procedure Date  . Laparoscopic cholecystectomy 01/2007    Dr. Odie Sera  . Abdominal hysterectomy   . Bilateral thr's   . Right total knee 11/1999    Dr. Leslee Home  . Left femur fracture 04/2002    Dr. Ranell Patrick  . Lumbar fusion     Outpatient Encounter Prescriptions as of 12/13/2011  Medication Sig Dispense Refill  . ALPRAZolam (XANAX) 0.5 MG tablet Take 1/2 to 1 tablet by mouth three times daily as needed for nerves  270 tablet  3  . amLODipine-valsartan (EXFORGE) 5-160 MG per tablet Take 1/2 tablet by mouth once daily      . aspirin 81 MG tablet Take 81 mg by mouth daily.        . clopidogrel (PLAVIX) 75 MG tablet Take 1 tablet (75 mg total) by mouth daily.  90 tablet  3  . dextromethorphan-guaiFENesin (MUCINEX DM) 30-600 MG per 12 hr tablet Take 2 tablets by mouth every 12 (twelve) hours. With plenty of fluids       . donepezil (ARICEPT) 10 MG tablet Take 1 tablet (10 mg total) by mouth at bedtime as needed.  90 tablet  0  . fish oil-omega-3 fatty acids 1000 MG capsule Take 1 g by mouth daily.        . furosemide (LASIX) 40 MG tablet 1/2 tablet by mouth daily      . glucosamine-chondroitin 500-400 MG tablet Take 1 tablet by mouth daily.        . isosorbide mononitrate (IMDUR) 30 MG 24 hr tablet Take 1/2 tablet by mouth once daily  45 tablet  3  . levothyroxine (SYNTHROID, LEVOTHROID) 75 MCG tablet Take 1  tablet (75 mcg total) by mouth daily.  90 tablet  3  . memantine (NAMENDA) 10 MG tablet Take 1 tablet (10 mg total) by mouth 2 (two) times daily.  180 tablet  3  . metFORMIN (GLUCOPHAGE) 500 MG tablet Take 500 mg by mouth daily with breakfast.      . metoprolol (TOPROL-XL) 100 MG 24 hr tablet Take 1/2 tablet by mouth once daily      . Multiple Vitamins-Minerals (CENTRUM SILVER PO) Take 1 tablet by mouth daily.        . potassium chloride SA (K-DUR,KLOR-CON) 20 MEQ tablet Take 1 tablet (20 mEq total) by mouth daily.  90 tablet  3  . simvastatin (ZOCOR) 40 MG tablet Take 1 tablet (40 mg total) by mouth at bedtime.  90 tablet  3  . vitamin D, CHOLECALCIFEROL, 400 UNITS tablet  Take 400 Units by mouth daily.        Marland Kitchen DISCONTD: furosemide (LASIX) 40 MG tablet Take 1 tablet (40 mg total) by mouth daily.  90 tablet  3    Allergies  Allergen Reactions  . Morphine     Hallucinations   . Ofloxacin     Unable to remember    Current Medications, Allergies, Past Medical History, Past Surgical History, Family History, and Social History were reviewed in [employer] record.    Review of Systems     See HPI - all other systems neg except as noted.    The patient complains of decreased hearing, dyspnea on exertion, muscle weakness, and difficulty walking.  The patient denies anorexia, fever, weight loss, weight gain, vision loss, hoarseness, chest pain, syncope, peripheral edema, prolonged cough, headaches, hemoptysis, abdominal pain, melena, hematochezia, severe indigestion/heartburn, hematuria, incontinence, suspicious skin lesions, transient blindness, depression, unusual weight change, abnormal bleeding, enlarged lymph nodes, and angioedema.     Physical Exam   WD, Overweight, 78 y/o WF in NAD... GENERAL:  Alert & oriented; pleasant & cooperative... HEENT:  [place]/AT, EOM-wnl, PERRLA, EACs-clear, TMs-wnl, NOSE-clear, THROAT-clear & wnl. NECK:  Supple w/ fairROM; no JVD; normal  carotid impulses w/o bruits; no thyromegaly or nodules palpated; no lymphadenopathy. CHEST:  Clear to P & A; without wheezes/ rales/ or rhonchi heard... HEART:  Regular Rhythm; without murmurs/ rubs/ or gallops detected... ABDOMEN:  Soft & nontender; normal bowel sounds; no organomegaly or masses palpated... EXT:  mod arthritic changes; +varicose veins/ +venous insuffic/ 1+ edema;  intact pulses in LE's... NEURO:  CN's intact; no focal neuro deficit... DERM:  No lesions noted; no rash etc...  RADIOLOGY DATA:  Reviewed in the EPIC EMR & discussed w/ the patient...  LABORATORY DATA:  Reviewed in the EPIC EMR & discussed w/ the patient...     ASSESSMENT & PLAN:  HBP>  BPs improved w/ decr in doses to 1/2 tabs as above; keep same meds & monitor BP at home, call for problems  ASHD>  Stable on meds + IMDUR & her ASA/ PLAVIX;  Continue same meds...  Ven Insuffic>  She has right > left leg swelling since her fall- continue meds & check VenDopplers==> neg, no DVT etc.  CHOL>  Labs look good on Simva + Fish Oil; continue same...  Hypothy>  Stable on the Levo75...  GU>  She responded to Cipro previously, & last UA was clear...  DJD/ LBP>  She had epidural steroid injection from DrRamos in the past;  Currently using OTC meds prn...  ASPVD, TIA, ANEURYSM>    << as above >>  MEMORY LOSS>  On Aricept & NAMENDA; stable clinically & last check by Neuro- ok, continue same...   Patient's Medications  New Prescriptions   No medications on file  Previous Medications   ALPRAZOLAM (XANAX) 0.5 MG TABLET    Take 1/2 to 1 tablet by mouth three times daily as needed for nerves   AMLODIPINE-VALSARTAN (EXFORGE) 5-160 MG PER TABLET    Take 1/2 tablet by mouth once daily   ASPIRIN 81 MG TABLET    Take 81 mg by mouth daily.     CLOPIDOGREL (PLAVIX) 75 MG TABLET    Take 1 tablet (75 mg total) by mouth daily.   DEXTROMETHORPHAN-GUAIFENESIN (MUCINEX DM) 30-600 MG PER 12 HR TABLET    Take 2 tablets by mouth  every 12 (twelve) hours. With plenty of fluids    DONEPEZIL (ARICEPT) 10 MG  TABLET    Take 1 tablet (10 mg total) by mouth at bedtime as needed.   FISH OIL-OMEGA-3 FATTY ACIDS 1000 MG CAPSULE    Take 1 g by mouth daily.     GLUCOSAMINE-CHONDROITIN 500-400 MG TABLET    Take 1 tablet by mouth daily.     ISOSORBIDE MONONITRATE (IMDUR) 30 MG 24 HR TABLET    Take 1/2 tablet by mouth once daily   LEVOTHYROXINE (SYNTHROID, LEVOTHROID) 75 MCG TABLET    Take 1 tablet (75 mcg total) by mouth daily.   MEMANTINE (NAMENDA) 10 MG TABLET    Take 1 tablet (10 mg total) by mouth 2 (two) times daily.   METFORMIN (GLUCOPHAGE) 500 MG TABLET    Take 500 mg by mouth daily with breakfast.   METOPROLOL (TOPROL-XL) 100 MG 24 HR TABLET    Take 1/2 tablet by mouth once daily   MULTIPLE VITAMINS-MINERALS (CENTRUM SILVER PO)    Take 1 tablet by mouth daily.     POTASSIUM CHLORIDE SA (K-DUR,KLOR-CON) 20 MEQ TABLET    Take 1 tablet (20 mEq total) by mouth daily.   SIMVASTATIN (ZOCOR) 40 MG TABLET    Take 1 tablet (40 mg total) by mouth at bedtime.   VITAMIN D, CHOLECALCIFEROL, 400 UNITS TABLET    Take 400 Units by mouth daily.    Modified Medications   Modified Medication Previous Medication   FUROSEMIDE (LASIX) 40 MG TABLET furosemide (LASIX) 40 MG tablet      1/2 tablet by mouth daily    Take 1 tablet (40 mg total) by mouth daily.  Discontinued Medications   No medications on file

## 2011-12-13 NOTE — Patient Instructions (Signed)
Today we updated your med list in our EPIC system...    Continue your current medications the same...  Today we decided to check Venous doppler studies of your legs to rule out a blood clot... We also did your follow up FASTING blood work...    We will call you w/ the results when avail...  In the meanwhile, keep your legs elevated & remember> NO SALT!!!  Call for any questions...  Let's plan another recheck in 4 month's time.Marland KitchenMarland Kitchen

## 2011-12-14 ENCOUNTER — Other Ambulatory Visit: Payer: Self-pay | Admitting: Pulmonary Disease

## 2011-12-14 ENCOUNTER — Ambulatory Visit (HOSPITAL_COMMUNITY)
Admission: RE | Admit: 2011-12-14 | Discharge: 2011-12-14 | Disposition: A | Discharge status: Home / Self Care | Payer: Medicare Other | Source: Ambulatory Visit | Attending: Pulmonary Disease | Admitting: Pulmonary Disease

## 2011-12-14 DIAGNOSIS — I83891 Varicose veins of right lower extremities with other complications: Secondary | ICD-10-CM

## 2011-12-14 DIAGNOSIS — M7989 Other specified soft tissue disorders: Secondary | ICD-10-CM

## 2011-12-14 DIAGNOSIS — M79609 Pain in unspecified limb: Secondary | ICD-10-CM | POA: Insufficient documentation

## 2011-12-14 LAB — VITAMIN D 25 HYDROXY (VIT D DEFICIENCY, FRACTURES): Vit D, 25-Hydroxy: 43 ng/mL (ref 30–89)

## 2011-12-14 NOTE — Progress Notes (Addendum)
VASCULAR LAB PRELIMINARY  PRELIMINARY  PRELIMINARY  PRELIMINARY  Right lower extremity venous duplex completed.    Preliminary report:  Right:  No evidence of DVT, superficial thrombosis, or Baker's cyst.   Hellen Shanley D, RVS 12/14/2011, 11:32 AM

## 2012-02-28 ENCOUNTER — Telehealth: Payer: Self-pay | Admitting: Pulmonary Disease

## 2012-02-28 MED ORDER — DONEPEZIL HCL 10 MG PO TABS: 10.0000 mg | ORAL_TABLET | Freq: Every evening | ORAL | Status: DC | PRN

## 2012-02-28 NOTE — Telephone Encounter (Signed)
Refill sent. I spoke with Rejeana and verified pharmacy as well.Carron Curie, CMA

## 2012-04-09 ENCOUNTER — Other Ambulatory Visit (HOSPITAL_COMMUNITY): Payer: Self-pay | Admitting: Orthopedic Surgery

## 2012-04-09 DIAGNOSIS — M25561 Pain in right knee: Secondary | ICD-10-CM

## 2012-04-16 ENCOUNTER — Ambulatory Visit (HOSPITAL_COMMUNITY)
Admission: RE | Admit: 2012-04-16 | Discharge: 2012-04-16 | Disposition: A | Discharge status: Home / Self Care | Payer: Medicare Other | Source: Ambulatory Visit | Attending: Orthopedic Surgery | Admitting: Orthopedic Surgery

## 2012-04-16 ENCOUNTER — Encounter (HOSPITAL_COMMUNITY)
Admission: RE | Admit: 2012-04-16 | Discharge: 2012-04-16 | Disposition: A | Discharge status: Home / Self Care | Payer: Medicare Other | Source: Ambulatory Visit | Attending: Orthopedic Surgery | Admitting: Orthopedic Surgery

## 2012-04-16 DIAGNOSIS — Z96659 Presence of unspecified artificial knee joint: Secondary | ICD-10-CM | POA: Insufficient documentation

## 2012-04-16 DIAGNOSIS — M25569 Pain in unspecified knee: Secondary | ICD-10-CM | POA: Insufficient documentation

## 2012-04-16 DIAGNOSIS — M25561 Pain in right knee: Secondary | ICD-10-CM

## 2012-04-16 MED ORDER — TECHNETIUM TC 99M MEDRONATE IV KIT
25.0000 | PACK | Freq: Once | INTRAVENOUS | Status: AC | PRN
  Administered 2012-04-16: 25 via INTRAVENOUS

## 2012-04-18 ENCOUNTER — Telehealth: Payer: Self-pay | Admitting: Pulmonary Disease

## 2012-04-18 MED ORDER — METOPROLOL SUCCINATE ER 100 MG PO TB24: ORAL_TABLET | ORAL | Status: DC

## 2012-04-18 MED ORDER — AMLODIPINE BESYLATE-VALSARTAN 5-160 MG PO TABS: ORAL_TABLET | ORAL | Status: DC

## 2012-04-18 NOTE — Telephone Encounter (Signed)
rx's have been sent. Nothing further was needed

## 2012-04-28 ENCOUNTER — Telehealth: Payer: Self-pay | Admitting: Pulmonary Disease

## 2012-04-28 NOTE — Telephone Encounter (Signed)
Printed PA form from [facility] website-preferred alternatives are atacand, losartan, valsartan hct , or avapro.. Please advise if one of these meds is acceptable or if you want to proceed with PA. Please advise, thanks!

## 2012-04-29 NOTE — Telephone Encounter (Signed)
Per SN: change her to preferred drug. Valsartan (diovan) #60 QD. Advise her insurance will not cover exforge but valsartan is very similar. Needs ROV in 1 month w/ SN for rev on new medication. I have called and lmomtcb x1 to advise of this before sending in RX

## 2012-04-30 MED ORDER — VALSARTAN 160 MG PO TABS: 160.0000 mg | ORAL_TABLET | Freq: Every day | ORAL | Status: DC

## 2012-04-30 NOTE — Telephone Encounter (Signed)
LMTCB

## 2012-04-30 NOTE — Telephone Encounter (Signed)
Daughter returned call. Same ph# as before. Andrea Mccoy

## 2012-04-30 NOTE — Telephone Encounter (Signed)
Rene Kocher (daughter) returned our call.  Call her back @ [phone] Leanora Ivanoff

## 2012-04-30 NOTE — Telephone Encounter (Signed)
Daughter is aware of new Rx and wants sent to [facility] in [place], [place]. Diovan 160mg  #30 take 1po QD. Daughter will call back and schedule OV with SN as soon as she knows when pt can come in as she will be staying with her sister.

## 2012-05-06 ENCOUNTER — Telehealth: Payer: Self-pay | Admitting: Pulmonary Disease

## 2012-05-06 MED ORDER — LOSARTAN POTASSIUM 100 MG PO TABS: 100.0000 mg | ORAL_TABLET | Freq: Every day | ORAL | Status: DC

## 2012-05-06 NOTE — Telephone Encounter (Signed)
Called the pharmacy and they are not able to get the valsartan 160 mg unless it is in the combo form with hctz.  SN is aware and a new rx for losartan has been sent to the pharmacy for the pt and the med list has been updated.

## 2012-05-06 NOTE — Telephone Encounter (Signed)
PA form received from [facility] for Diovan. Please have SN review the form, complete and sign.  Form placed on SN cart.

## 2012-05-15 ENCOUNTER — Ambulatory Visit (INDEPENDENT_AMBULATORY_CARE_PROVIDER_SITE_OTHER): Payer: Medicare Other

## 2012-05-15 DIAGNOSIS — Z23 Encounter for immunization: Secondary | ICD-10-CM

## 2012-05-16 DIAGNOSIS — Z23 Encounter for immunization: Secondary | ICD-10-CM

## 2012-05-29 ENCOUNTER — Other Ambulatory Visit: Payer: Self-pay | Admitting: Pulmonary Disease

## 2012-05-29 MED ORDER — LOSARTAN POTASSIUM 100 MG PO TABS: 100.0000 mg | ORAL_TABLET | Freq: Every day | ORAL | Status: DC

## 2012-06-17 ENCOUNTER — Telehealth: Payer: Self-pay | Admitting: Pulmonary Disease

## 2012-06-17 NOTE — Telephone Encounter (Signed)
Pt's daughter states the Exforge needede a PA done at first and SN changed this to Losartan. Now they are saying a PA needs to be done for the Losartan. We will call the pt's insurance in the morning to see what needs to be done or what is covered. Member IC # [id number]., phone is [phone].

## 2012-06-17 NOTE — Telephone Encounter (Signed)
[facility] [place] at number provided. No answer. LMOMTCB

## 2012-06-18 NOTE — Telephone Encounter (Signed)
I spoke with a rep at [facility] and Losartan Potassium is covered under the pt's plan. LMOMTCB for Andrea Mccoy. Not sure why she was told this is not covered.

## 2012-06-18 NOTE — Telephone Encounter (Signed)
Spoke with patients daughter and is aware that Losartan does NOT require PA and patient can call [phone] to get refill. I spoke with [facility] and they confirmed that RX did NOT require PA. Pts daughter will call for refill and if any troubles will call us back.

## 2012-06-23 DIAGNOSIS — R350 Frequency of micturition: Secondary | ICD-10-CM | POA: Diagnosis not present

## 2012-06-23 DIAGNOSIS — J3489 Other specified disorders of nose and nasal sinuses: Secondary | ICD-10-CM | POA: Diagnosis not present

## 2012-06-23 DIAGNOSIS — J069 Acute upper respiratory infection, unspecified: Secondary | ICD-10-CM | POA: Diagnosis not present

## 2012-08-14 ENCOUNTER — Other Ambulatory Visit: Payer: Self-pay | Admitting: Pulmonary Disease

## 2012-08-14 MED ORDER — DONEPEZIL HCL 10 MG PO TABS: 10.0000 mg | ORAL_TABLET | Freq: Every evening | ORAL | Status: DC | PRN

## 2012-08-14 MED ORDER — CLOPIDOGREL BISULFATE 75 MG PO TABS: 75.0000 mg | ORAL_TABLET | Freq: Every day | ORAL | Status: DC

## 2012-08-25 ENCOUNTER — Encounter: Payer: Self-pay | Admitting: Internal Medicine

## 2012-08-25 ENCOUNTER — Ambulatory Visit (INDEPENDENT_AMBULATORY_CARE_PROVIDER_SITE_OTHER): Payer: Medicare Other | Admitting: Internal Medicine

## 2012-08-25 VITALS — BP 130/72 | HR 91 | Ht 64.0 in | Wt 181.0 lb

## 2012-08-25 DIAGNOSIS — R0602 Shortness of breath: Secondary | ICD-10-CM

## 2012-08-25 DIAGNOSIS — R5383 Other fatigue: Secondary | ICD-10-CM

## 2012-08-25 DIAGNOSIS — E559 Vitamin D deficiency, unspecified: Secondary | ICD-10-CM

## 2012-08-25 DIAGNOSIS — I2510 Atherosclerotic heart disease of native coronary artery without angina pectoris: Secondary | ICD-10-CM

## 2012-08-25 DIAGNOSIS — R5381 Other malaise: Secondary | ICD-10-CM

## 2012-08-25 LAB — CBC WITH DIFFERENTIAL/PLATELET
Basophils Absolute: 0 10*3/uL (ref 0.0–0.1)
Eosinophils Relative: 1.1 % (ref 0.0–5.0)
HCT: 42.2 % (ref 36.0–46.0)
Lymphocytes Relative: 15.4 % (ref 12.0–46.0)
Lymphs Abs: 1.5 10*3/uL (ref 0.7–4.0)
Monocytes Relative: 8.6 % (ref 3.0–12.0)
Neutrophils Relative %: 74.7 % (ref 43.0–77.0)
Platelets: 198 10*3/uL (ref 150.0–400.0)
RDW: 13.5 % (ref 11.5–14.6)
WBC: 10.1 10*3/uL (ref 4.5–10.5)

## 2012-08-25 LAB — COMPREHENSIVE METABOLIC PANEL
ALT: 17 U/L (ref 0–35)
Albumin: 3.8 g/dL (ref 3.5–5.2)
Alkaline Phosphatase: 92 U/L (ref 39–117)
CO2: 30 mEq/L (ref 19–32)
GFR: 52.11 mL/min — ABNORMAL LOW (ref >60.00)
Glucose, Bld: 95 mg/dL (ref 70–99)
Potassium: 4.4 mEq/L (ref 3.5–5.1)
Sodium: 140 mEq/L (ref 135–145)
Total Bilirubin: 0.6 mg/dL (ref 0.3–1.2)
Total Protein: 6.8 g/dL (ref 6.0–8.3)

## 2012-08-25 LAB — LIPID PANEL
Cholesterol: 146 mg/dL (ref 0–200)
LDL Cholesterol: 77 mg/dL (ref 0–99)
Total CHOL/HDL Ratio: 3
Triglycerides: 116 mg/dL (ref 0.0–149.0)
VLDL: 23.2 mg/dL (ref 0.0–40.0)

## 2012-08-25 NOTE — Patient Instructions (Addendum)
Lab work today.

## 2012-08-25 NOTE — Progress Notes (Signed)
HPI HPIPatient is a 79 year old with a history of CAD (cath in 2003, myoview in 2008, normal). Also a history of HTN, dyslipididemia, TIA. I saw hier in clinic 1 year ago. Patient says since seen she doesn't have get up and go When does go can do things.  Does get short of breath at times. No PND Allergies  Allergen Reactions  . Morphine     Hallucinations   . Ofloxacin     Unable to remember    Current Outpatient Prescriptions  Medication Sig Dispense Refill  . ALPRAZolam (XANAX) 0.5 MG tablet Take 1/2 to 1 tablet by mouth three times daily as needed for nerves  270 tablet  3  . aspirin 81 MG tablet Take 81 mg by mouth daily.        . clopidogrel (PLAVIX) 75 MG tablet Take 1 tablet (75 mg total) by mouth daily.  90 tablet  3  . dextromethorphan-guaiFENesin (MUCINEX DM) 30-600 MG per 12 hr tablet Take 2 tablets by mouth every 12 (twelve) hours. With plenty of fluids       . donepezil (ARICEPT) 10 MG tablet Take 1 tablet (10 mg total) by mouth at bedtime as needed.  90 tablet  3  . fish oil-omega-3 fatty acids 1000 MG capsule Take 1 g by mouth daily.        . furosemide (LASIX) 40 MG tablet 1/2 tablet by mouth daily      . glucosamine-chondroitin 500-400 MG tablet Take 1 tablet by mouth daily.        . isosorbide mononitrate (IMDUR) 30 MG 24 hr tablet Take 1/2 tablet by mouth once daily  45 tablet  3  . levothyroxine (SYNTHROID, LEVOTHROID) 75 MCG tablet Take 1 tablet (75 mcg total) by mouth daily.  90 tablet  3  . losartan (COZAAR) 100 MG tablet Take 1 tablet (100 mg total) by mouth daily.  90 tablet  3  . memantine (NAMENDA) 10 MG tablet Take 1 tablet (10 mg total) by mouth 2 (two) times daily.  180 tablet  3  . metFORMIN (GLUCOPHAGE) 500 MG tablet Take 500 mg by mouth daily with breakfast.      . metoprolol succinate (TOPROL-XL) 100 MG 24 hr tablet Take 1/2 tablet by mouth once daily  45 tablet  3  . Multiple Vitamins-Minerals (CENTRUM SILVER PO) Take 1 tablet by mouth daily.          . potassium chloride SA (K-DUR,KLOR-CON) 20 MEQ tablet Take 1 tablet (20 mEq total) by mouth daily.  90 tablet  3  . simvastatin (ZOCOR) 40 MG tablet Take 1 tablet (40 mg total) by mouth at bedtime.  90 tablet  3  . vitamin D, CHOLECALCIFEROL, 400 UNITS tablet Take 400 Units by mouth daily.          Past Medical History  Diagnosis Date  . Allergic rhinitis   . Asthmatic bronchitis   . Hypertension   . Arteriosclerotic heart disease   . Venous insufficiency (chronic) (peripheral)   . Hypercholesteremia   . Hypothyroid   . Nontoxic multinodular goiter   . GERD (gastroesophageal reflux disease)   . Diverticulosis of colon   . Cholelithiasis   . Chronic UTI   . DJD (degenerative joint disease)   . TIA (transient ischemic attack)   . Memory loss   . Intracranial aneurysm   . Anxiety   . Anemia     Past Surgical History  Procedure Date  . Laparoscopic  cholecystectomy 01/2007    Dr. Odie Sera  . Abdominal hysterectomy   . Bilateral thr's   . Right total knee 11/1999    Dr. Leslee Home  . Left femur fracture 04/2002    Dr. Ranell Patrick  . Lumbar fusion     No family history on file.  History   Social History  . Marital Status: Widowed    Spouse Name: N/A    Number of Children: 2  . Years of Education: N/A   Occupational History  . retired    Social History Main Topics  . Smoking status: Never Smoker   . Smokeless tobacco: Never Used  . Alcohol Use: No  . Drug Use: No  . Sexually Active: Not on file   Other Topics Concern  . Not on file   Social History Narrative  . No narrative on file    Review of Systems:  All systems reviewed.  They are negative to the above problem except as previously stated.  Vital Signs: BP 130/72  Pulse 91  Ht 5\' 4"  (1.626 m)  Wt 181 lb (82.101 kg)  BMI 31.07 kg/m2  Physical Exam Patient is an obese 79 yo in NAD HEENT:  Normocephalic, atraumatic. EOMI, PERRLA.  Neck: JVP is normal.  No bruits.  Lungs: clear to auscultation. No  rales no wheezes.  Heart: Regular rate and rhythm. Normal S1, S2. No S3.   No significant murmurs. PMI not displaced.  Abdomen:  Supple, nontender. Normal bowel sounds. No masses. No hepatomegaly.  Extremities:   Good distal pulses throughout. No lower extremity edema.  Musculoskeletal :moving all extremities.  Neuro:   alert and oriented x3.  CN II-XII grossly intact.  EKG  SR 92 bpm.   Assessment and Plan:  1.  CAD  I am not convinced that current symptoms represent angina.  Will check labs first.  Keep on same regimen.  Activity as tolerated.  If labs normal consider myoview  2.  HTN  Adequate   Follow  3.  HL  Continue statin  4.  TIA  Keep on plavix.

## 2012-09-02 ENCOUNTER — Other Ambulatory Visit: Payer: Self-pay | Admitting: *Deleted

## 2012-09-09 ENCOUNTER — Ambulatory Visit (INDEPENDENT_AMBULATORY_CARE_PROVIDER_SITE_OTHER): Payer: Medicare Other | Admitting: Pulmonary Disease

## 2012-09-09 ENCOUNTER — Encounter: Payer: Self-pay | Admitting: Pulmonary Disease

## 2012-09-09 VITALS — BP 116/80 | HR 91 | Temp 96.8°F | Ht 63.5 in | Wt 181.2 lb

## 2012-09-09 DIAGNOSIS — E7800 Pure hypercholesterolemia, unspecified: Secondary | ICD-10-CM

## 2012-09-09 DIAGNOSIS — J209 Acute bronchitis, unspecified: Secondary | ICD-10-CM

## 2012-09-09 DIAGNOSIS — R413 Other amnesia: Secondary | ICD-10-CM

## 2012-09-09 DIAGNOSIS — I2510 Atherosclerotic heart disease of native coronary artery without angina pectoris: Secondary | ICD-10-CM

## 2012-09-09 DIAGNOSIS — N390 Urinary tract infection, site not specified: Secondary | ICD-10-CM

## 2012-09-09 DIAGNOSIS — I10 Essential (primary) hypertension: Secondary | ICD-10-CM

## 2012-09-09 DIAGNOSIS — G459 Transient cerebral ischemic attack, unspecified: Secondary | ICD-10-CM

## 2012-09-09 DIAGNOSIS — E039 Hypothyroidism, unspecified: Secondary | ICD-10-CM

## 2012-09-09 DIAGNOSIS — I872 Venous insufficiency (chronic) (peripheral): Secondary | ICD-10-CM

## 2012-09-09 DIAGNOSIS — K5730 Diverticulosis of large intestine without perforation or abscess without bleeding: Secondary | ICD-10-CM

## 2012-09-09 DIAGNOSIS — F411 Generalized anxiety disorder: Secondary | ICD-10-CM

## 2012-09-09 DIAGNOSIS — K219 Gastro-esophageal reflux disease without esophagitis: Secondary | ICD-10-CM

## 2012-09-09 DIAGNOSIS — M1990 Unspecified osteoarthritis, unspecified site: Secondary | ICD-10-CM

## 2012-09-09 NOTE — Progress Notes (Signed)
HPI  79 y/o WF here for a follow up visit... she has multiple medical problems as noted below...   ~  Dec 18, 2010: 2253mo ROV & reasonably stable>  She saw DrRamos 11/11 for LBP w/ eval revealing multilevel DDD & she has an epid steriod injection w/ some temp benefit & she uses OTC meds for pain relief...  She had Cards f/u DrRoss 12/11 & was felt to be stable, no angina, no SOB, & no change in med Rx...  She had a Neuro eval from DrWillis 1/12 & he agreed w/ Aricept Rx & will consider adding Namenda later; he rec stopping the Amitriptyline & they feel her paresthesias are sl better off this med...  She was also treated for a UTI 3/12 by TP w/ a pansens EColi (given Cipro250 w/ resolution)...  ~  Jan 04, 2011:  2 week ROV & post-hosp check> she was visiting a relative in the hosp 12/27/10 when she felt light headed & fainted briefly (fell back into a chair) then confused- taken to ER w/ syncope & Adm for 2d> CT Br showed atrophy, sm vessel dis, vasc calcif, NAD;  EEG was WNL;  CDopplers were neg x for mild plaque- no extracranial carotid dis;  EKG (in ER) w/ [id number], NSSTTWA;  2DEcho showed normal LVF (55-60%) & no wall motion abn, essent norm valves etc;  Labs were norm as well... Disch on same meds> since disch she's been sl weak & BP at home sl low (home health said ~100/50 & we decr her Exforge)...  We decided to cut back slightly on her BP Rx & monitor BP closely at home> rec to take 1/2 of the Exforge & 1/2 of the ToprolXL.  ~  March 30, 2011:  19mo ROV & they note BPs at home have been pretty good & review of log book shows 120-140/ 70-80 range; daughter note incr forgetful & she's on Aricept (refilled today) & review of DrWillis note indicated low threshold for adding Namenda & they are in agreement;  Otherwise stable> 122/76 today; denies CP, palpit, syncope, ch in DOE, edema; denies cerebral ischemic symptoms; reviewed FLP, TSH, other labs> stable, no changes made...  ~  August 23, 2011:  253mo ROV  & her CC is that her fluid pill is too strong & she urinates too freq, want to decr the Lasix 40mg /d to 1/2 tab daily & we have given her a range of 1/2 to 1 tab daily based on BP & fluid retention problems... We reviewed her prev labs, XRays, and scans> she tells me that she has upcoming appts w/ DrRoss for Cards, DrNorris for Ortho, DrWillis for Neurology...  She saw DrWillis 8/12 w/ decr memory, numbness in hands & feet, syncopal spell ?etiology etc; rec wrist splint trial & NCVs showed  No evid for periph neuropathy & borderline right CTS...  ~  Dec 13, 2011:  53mo ROV & Mamie noted some recent dizziness & fell off her porch w/ mult bruises; she notes BP has been ok on meds & we cut the Lasix down to 1/2 tab Qam;  Since then her right leg has swollen > than left leg & we discussed checking VenDopplers to be sure there is no DVT==> Neg.    She saw DrRoss for Cards 1/13> f/u HBP, CAD, Dyslipidemia- felt to be doing ok & no changes made...    She also had f/u DrWillis for Neurology 2/13> Hx memory impairment (tolerating Aricept & Namenda well), prob CTS (  try wrist splints); She had CTBrain 5/12 showing atrophy, sm vessel dis, vasc calcif, no acute changes... LABS 5/13:  FLP- at goals on Simva40;  Chems- essen wnl;  CBC- wnl;  TSH=1.22;  Mg=2.0, Sed=24... Venous Dopplers 5/13:  Neg- no DVT or superficial thrombophlebitis...  ~  September 09, 2012:  72mo ROV & [place]'s CC= stiffness in her knees (R>L) after sitting for awhile, but otherw feeling pretty good she says;  She has seen DrNorris=> DrOlin & they ordered home therapy...    Breathing is stable; BP= 116/80 on MetopER50, Losar100, Lasix20, K20; also takes Imdur30-1/2 & ASA/Plavix- no CP, palpit, ch in SOB, edema, etc; She saw DrRoss 1/14> felt to be stable & no change made to her med regimen... Chol at goals on diet + Simva40; she has had some mild gluc intol but has been diet controlled w/o need for meds (Metform500 was listed incorrectly); BS=95...     She remains on Aricept & Namenda w/ memory about the same per family; followed by DrWillis & felt to be stable... We reviewed prob list, meds, xrays and labs> see below for updates >> she had the 2013 flu vaccine 10/13; they did not bring med list or bottles to the OV today... LABS 1/14:  FLP- at goals on Simva40;  Chems- wnl w/ BS=95;  CBC- wnl;  TSH=0.48 on Levo75;  VitD=32 on supplement;  BNP=99         Problem List:  Hx of ASTHMATIC BRONCHITIS (ICD-466.0) - she is a never smoker;  breathing has been good & she denies cough, sputum, dyspnea, etc... ~  CXR 5/11 showed normal heart size w/ calcif in Ao, clear lungs x for bibasilar scarring/ atx, scoliosis & degen changes in spine...  HYPERTENSION (ICD-401.9) - on TOPROL XL 100- 1/2 daily,  LOSARTAN100 daily,  LASIX 40mg - 1/2daily, KCl 60mEq/d, & IMDUR 30mg - 1/2tab/d... ~  labs 4/11 showed normal BMet... & norm labs in ER 5/11 x BS=189. ~  11/11: BP= 116/80 & similar at home- notes reduced symptoms of lightheadedness & denies HA, visual changes, CP, palipit, syncope, edema, etc... ~  12/18/10:  BP= 134/78, feeling OK & denies symptoms... ~  01/04/11:  Post hosp check & BP 150/80 supine, 140/70 sitting, 130/70 standing (no symptoms)> decided to decr the ToprolXL & Exforge to 1/2 of each. ~  Since then BP checks at home & thru office have been good on above meds... ~  1/13:  BP= 122/72 & she has presistant mult somatic complaints... ~  5/13:  BP= 142/70 & she appears stable w/ mult minor complaints... ~  2/14:  BP= 116/80 & she is stable...  ARTERIOSCLEROTIC HEART DISEASE (ICD-414.00) - on IMDUR 30mg - 1/2 tab daily;  + ASA 81mg /d & PLAVIX 75mg /d...  ~  cath 1998 & 2003 w/ LAD lesion~50%, no other abn seen... ~  neg Myoview 11/08 w/o ischemia and EF=83%. ~  She follows up w/ DrRoss yearly... ~  saw DrRoss on 2/09 w/ review of 11/08 hosp for atypical CP...  doing satis without recurrent CP but too sedentary and difficulty getting around w/ her  arthritis... ~  she had Cards f/u DrRoss 12/11 & was felt to be stable, no angina, no SOB, & no change in med Rx; EKG= NSR, WNL.Marland Kitchen. ~  5/12:  Hosp eval included> EKG (in ER) w/ [id number], NSSTTWA;  2DEcho showed normal LVF (55-60%) & no wall motion abn, essent norm valves etc. ~  8/12:  CDopplers w/ mild smooth calcif plaque bilat,  0-39% bilat ICAstenoses, f/u suggested in 73yrs... ~  EKG 1/14 showed NSR, rate92, ?short PR, otherw normal EKG...  VENOUS INSUFFICIENCY, CHRONIC (ICD-459.81) - she has chronic venous insuffic changes in her LE's w/ edema, bruising, etc;  ~  she knows to elim sodium, elevate legs, wear support hose when able, etc... ~  5/13:  VenDopplers were neg- w/o DVT or superfic thrombosis  HYPERCHOLESTEROLEMIA (ICD-272.0) - on SIMVASTATIN 40mg /d & FISH OIL... ~  FLP 9/08 showed TChol 144, TG 204, HDL 34, LDL 72 ~  FLP 3/09 showed TChol 143, TG 178, HDL 33, LDL 74... rec- contin meds, incr exercise. ~  FLP 1/10 showed TChol 150, TG 162, HDL 39, LDL 79... DrRoss rec same meds, better diet. ~  FLP 7/10 showed TChol 143, TG 176, HDL 37, LDL 71 ~  FLP 4/11 showed TChol 154, TG 154, HDL 44, LDL 79 ~  FLP 3/12 showed TChol 150, TG 166, HDL 39, LDL 78 ~  FLP 8/12 on Simva40 showed TChol 143, TG 118, HDL 47, LDL 72 ~  FLP 5/13 on Simva40 showed TChol 143, TG 107, HDL 50, LDL 71 ~  FLP 1/14 on Simva40 showed TChol 146, TG 116, HDL 46, LDL 77  HYPOTHYROIDISM (ICD-244.9) - on LEVOTHYROID 16mcg/d... ~  labs 3/09 showed TSH= 0.98 ~  labs 7/10 showed TSH= 1.37 ~  labs 4/11 showed TSH= 1.39 ~  CDopplers 8/11 showed incidental thyroid nodule; subseq Thyroid Ultrasound showed mult bilat nodules & bx of dom lesion= non-neoplastic goiter> continue same med. ~  Labs 3/12 showed TSH= 1.16...  ~  Labs 5/12 in [place] showed TSH=0.74, FreeT3=81 (80-204), FreeT4=1.36 (0.80-1.80) ~  Labs 5/13 on Levo75 showed TSH= 1.22 ~  Labs 1/14 on Levo75 showed TSH= 0.48  GERD (ICD-530.81) - not currently on PPI  med & advised PRILOSEC 20mg  OTC Prn... ~  [facility]GD [facility] 6/06 showed HH, mild gastritis... ~  last saw [facility] Aug09 w/ Carafate added (off now).  DIVERTICULOSIS OF COLON (ICD-562.10) - followed by [facility] and last colonoscopy was 4/08 showing divertics, 2 sm polyps (tubular adenomas), & hems...  CHOLELITHIASIS (ICD-574.20) - seen on prev CTAbd 4/08... s/p lap chole 6/08 by DrHoxworth...  URINARY TRACT INFECTION, CHRONIC (ICD-599.0) - mult cultures in 2009 showed sens EColi & Rx w/ Cipro... we discussed the need for Urology eval if UTI's continue to recur (for further eval). ~  5/11:  ER eval for mult symptoms and found to have a UTI- treated w/ Bacterim & resolved. ~  3/12:  Saw TP w/ UTI from a pansens EColi & Rx w/ Cipro- resolved...  DEGENERATIVE JOINT DISEASE (ICD-715.90) - she has severe DJD w/ prev bilat THR, right TKR, and left femur fx w/ pinning 2003... c/o difficulty w/ ambulation, exercise, etc... she takes Glucoamine Bid + MVI etc... ~  labs 7/10 showed Vit D level = 31... rec> start Vit D 1000 u daily. ~  11/11:  c/o right leg pain & she saw DrRamos w/ eval revealing multilevel DDD & she has an epid steriod injection w/ some temp benefit. ~  5/12: she uses OTC meds for pain relief... ~  9/13:  Ortho did Nuclear bone scan> no evid of prob w/ the right knee prosthesis & DJD changes in left knee noted... ~  2/14:  Labs showed Vit d level = 32 & she is asked to incr supplement to 2000u/d...  TIA (ICD-435.9) & INTRACRANIAL ANEURYSM (ICD-437.3) - had left side numbness 1996- saw DrWeymann> MRI was neg.Marland KitchenMarland Kitchen  Rx w/ ASA. ~  seen by TParrett 4/09 w/ numbness & paresthesias- norm sed, B12, folate... improved w/ reassurance. 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 PLAVIX 75mg /d added. ~  ER 5/11 & 7/11> facial numbness & tingling w/ nothing found &  she decided to stop the Aricept... CT Br w/ atrophy, sm vessel dis, atherosclerotic changes, NAD.Marland Kitchen. subseq MRA for f/u sm left paraophthalmic art aneurysm was unchanged... CDopplers 8/11 showed mild calcif plaque bilat, 0-39% bilat ICA stenoses, & incidental thyroid nodule. ~  ER & Hosp 5/12 for syncope> CT Br showed atrophy, sm vessel dis, vasc calcif, NAD;  EEG was WNL;  CDopplers were neg x for mild plaque- no extracranial carotid dis... ~  CDopplers at [facility] 8/12 showed stable mild plaque, 0-39% bilat ICA stenoses, f/u rec 38yrs...  MEMORY LOSS (ICD-780.93) - started on ARICEPT 2/09 and sl better over time... she does her own meds w/ daughter checking on her... ~  10/11:  pt had stopped Aricept on her own & offered restart vs Neuro consult> she decided to restart. ~  She had a Neuro eval from DrWillis 1/12 & he agreed w/ Aricept Rx & will consider adding Namenda later; he rec stopping the Amitriptyline & they feel her paresthesias are sl better off this med... ~  5/12:  CT Brain showed atrophy, sm vessel dis, vasc calcif, no ICH or infarct seen... ~  8/12:  Daughter concerned for her memory & some agitation; rec to add [facility] 10mg  1==>2 per day & she has f/u w/ Neuro soon. ~  8/12:  She had f/u DrWillis and stable on Aricept & Namenda; no changes made...  ANXIETY (ICD-300.00) - on ALPRAZOLAM 0.5mg  Bid Prn... ~  6/12: daughter thought she might be depressed & offered trial antidepressant med but they decline more meds at this time...  Hx of ANEMIA (ICD-285.9) - Hx of anemia in the past... Hg has remained WNL since then... ~  labs 7/10 showed Hg= 14.0 ~  labs 4/11 showed Hg= 14.7 ~  Labs 5/12 in [place] showed Hg= 13.9-14.7 ~  Labs 5/13 showed Hg= 12.9   Past Surgical History  Procedure Date  . Laparoscopic cholecystectomy 01/2007    Dr. Odie Sera  . Abdominal hysterectomy   . Bilateral thr's   . Right total knee 11/1999    Dr. Leslee Home  . Left femur fracture 04/2002    Dr. Ranell Patrick  .  Lumbar fusion     Outpatient Encounter Prescriptions as of 09/09/2012  Medication Sig Dispense Refill  . ALPRAZolam (XANAX) 0.5 MG tablet Take 1/2 to 1 tablet by mouth three times daily as needed for nerves  270 tablet  3  . aspirin 81 MG tablet Take 81 mg by mouth daily.        . clopidogrel (PLAVIX) 75 MG tablet Take 1 tablet (75 mg total) by mouth daily.  90 tablet  3  . dextromethorphan-guaiFENesin (MUCINEX DM) 30-600 MG per 12 hr tablet Take 2 tablets by mouth every 12 (twelve) hours. With plenty of fluids       . donepezil (ARICEPT) 10 MG tablet Take 1 tablet (10 mg total) by mouth at bedtime as needed.  90 tablet  3  . fish oil-omega-3 fatty acids 1000 MG capsule Take 1 g by mouth daily.        . furosemide (LASIX) 40  MG tablet 1/2 tablet by mouth daily      . glucosamine-chondroitin 500-400 MG tablet Take 1 tablet by mouth daily.        . isosorbide mononitrate (IMDUR) 30 MG 24 hr tablet Take 1/2 tablet by mouth once daily  45 tablet  3  . levothyroxine (SYNTHROID, LEVOTHROID) 75 MCG tablet Take 1 tablet (75 mcg total) by mouth daily.  90 tablet  3  . losartan (COZAAR) 100 MG tablet Take 1 tablet (100 mg total) by mouth daily.  90 tablet  3  . memantine (NAMENDA) 10 MG tablet Take 1 tablet (10 mg total) by mouth 2 (two) times daily.  180 tablet  3  . metFORMIN (GLUCOPHAGE) 500 MG tablet Take 500 mg by mouth daily with breakfast.      . metoprolol succinate (TOPROL-XL) 100 MG 24 hr tablet Take 1/2 tablet by mouth once daily  45 tablet  3  . Multiple Vitamins-Minerals (CENTRUM SILVER PO) Take 1 tablet by mouth daily.        . potassium chloride SA (K-DUR,KLOR-CON) 20 MEQ tablet Take 1 tablet (20 mEq total) by mouth daily.  90 tablet  3  . simvastatin (ZOCOR) 40 MG tablet Take 1 tablet (40 mg total) by mouth at bedtime.  90 tablet  3  . vitamin D, CHOLECALCIFEROL, 400 UNITS tablet Take 1 tablet (400 Units total) by mouth 2 (two) times daily.        Allergies  Allergen Reactions  .  Morphine     Hallucinations   . Ofloxacin     Unable to remember    Current Medications, Allergies, Past Medical History, Past Surgical History, Family History, and Social History were reviewed in [employer] record.    Review of Systems     See HPI - all other systems neg except as noted.    The patient complains of decreased hearing, dyspnea on exertion, muscle weakness, and difficulty walking.  The patient denies anorexia, fever, weight loss, weight gain, vision loss, hoarseness, chest pain, syncope, peripheral edema, prolonged cough, headaches, hemoptysis, abdominal pain, melena, hematochezia, severe indigestion/heartburn, hematuria, incontinence, suspicious skin lesions, transient blindness, depression, unusual weight change, abnormal bleeding, enlarged lymph nodes, and angioedema.     Physical Exam   WD, Overweight, 79 y/o WF in NAD... GENERAL:  Alert & oriented; pleasant & cooperative... HEENT:  [place]/AT, EOM-wnl, PERRLA, EACs-clear, TMs-wnl, NOSE-clear, THROAT-clear & wnl. NECK:  Supple w/ fairROM; no JVD; normal carotid impulses w/o bruits; no thyromegaly or nodules palpated; no lymphadenopathy. CHEST:  Clear to P & A; without wheezes/ rales/ or rhonchi heard... HEART:  Regular Rhythm; without murmurs/ rubs/ or gallops detected... ABDOMEN:  Soft & nontender; normal bowel sounds; no organomegaly or masses palpated... EXT:  mod arthritic changes; +varicose veins/ +venous insuffic/ 1+ edema;  intact pulses in LE's... NEURO:  CN's intact; no focal neuro deficit... DERM:  No lesions noted; no rash etc...  RADIOLOGY DATA:  Reviewed in the EPIC EMR & discussed w/ the patient...  LABORATORY DATA:  Reviewed in the EPIC EMR & discussed w/ the patient...     ASSESSMENT & PLAN:   HBP>  BPs improved w/ decr in doses to 1/2 tabs as above; keep same meds & monitor BP at home, call for problems  ASHD>  Stable on meds + IMDUR & her ASA/ PLAVIX;  Continue same  meds...  Ven Insuffic>  She has right > left leg swelling since her fall- continue meds & check VenDopplers==>  neg, no DVT etc.  CHOL>  Labs look good on Simva + Fish Oil; continue same...  Hypothy>  Stable on the Levo75...  GU>  She responded to Cipro previously, & last UA was clear...  DJD/ LBP>  She had epidural steroid injection from DrRamos in the past;  Currently using OTC meds prn...  ASPVD, TIA, ANEURYSM>    << as above >>  MEMORY LOSS>  On Aricept & NAMENDA; stable clinically & last check by Neuro- ok, continue same...   Patient's Medications  New Prescriptions   No medications on file  Previous Medications   ALPRAZOLAM (XANAX) 0.5 MG TABLET    Take 1/2 to 1 tablet by mouth three times daily as needed for nerves   ASPIRIN 81 MG TABLET    Take 81 mg by mouth daily.     CHOLECALCIFEROL (VITAMIN D) 2000 UNITS CAPS    Take 1 capsule by mouth daily.   CLOPIDOGREL (PLAVIX) 75 MG TABLET    Take 1 tablet (75 mg total) by mouth daily.   DEXTROMETHORPHAN-GUAIFENESIN (MUCINEX DM) 30-600 MG PER 12 HR TABLET    Take 2 tablets by mouth every 12 (twelve) hours. With plenty of fluids    DONEPEZIL (ARICEPT) 10 MG TABLET    Take 1 tablet (10 mg total) by mouth at bedtime as needed.   FISH OIL-OMEGA-3 FATTY ACIDS 1000 MG CAPSULE    Take 1 g by mouth daily.     FUROSEMIDE (LASIX) 40 MG TABLET    1/2 tablet by mouth daily   GLUCOSAMINE-CHONDROITIN 500-400 MG TABLET    Take 1 tablet by mouth daily.     ISOSORBIDE MONONITRATE (IMDUR) 30 MG 24 HR TABLET    Take 1/2 tablet by mouth once daily   LEVOTHYROXINE (SYNTHROID, LEVOTHROID) 75 MCG TABLET    Take 1 tablet (75 mcg total) by mouth daily.   LOSARTAN (COZAAR) 100 MG TABLET    Take 1 tablet (100 mg total) by mouth daily.   MEMANTINE (NAMENDA) 10 MG TABLET    Take 1 tablet (10 mg total) by mouth 2 (two) times daily.   METOPROLOL SUCCINATE (TOPROL-XL) 100 MG 24 HR TABLET    Take 1/2 tablet by mouth once daily   MULTIPLE VITAMINS-MINERALS (CENTRUM  SILVER PO)    Take 1 tablet by mouth daily.     POTASSIUM CHLORIDE SA (K-DUR,KLOR-CON) 20 MEQ TABLET    Take 1 tablet (20 mEq total) by mouth daily.   SIMVASTATIN (ZOCOR) 40 MG TABLET    Take 1 tablet (40 mg total) by mouth at bedtime.  Modified Medications   No medications on file  Discontinued Medications   METFORMIN (GLUCOPHAGE) 500 MG TABLET    Take 500 mg by mouth daily with breakfast.   VITAMIN D, CHOLECALCIFEROL, 400 UNITS TABLET    Take 1 tablet (400 Units total) by mouth 2 (two) times daily.

## 2012-09-09 NOTE — Patient Instructions (Addendum)
Today we updated your med list in our EPIC system...    Continue your current medications the same...  We reviewed your recent lab work today...  Please increase your vit D dose to a 2000u OTC Vit D supplement daily...  Call for any problems...  Let's plan a follow up visit in 6 months.Marland KitchenMarland Kitchen

## 2012-09-10 ENCOUNTER — Telehealth: Payer: Self-pay | Admitting: Pulmonary Disease

## 2012-09-10 NOTE — Telephone Encounter (Signed)
Spoke with daughter wanted to know why her mom was taking Metformin.that her mom is not a diabetic. Dr Kriste Basque please advise thank  you

## 2012-09-10 NOTE — Telephone Encounter (Signed)
Pt was seen on 09/09/12 and daughter was calling to find out why her mother is taking the metformin 500 mg every morning.  The daughter stated that her mother is not a diabetic.  Pt had a visiting nurse today and they wanted to know how many times she checks her sugar.  The daughter informed them that she is not a diabetic, but this was on her med list from her ov the other day.  Daughter was on the phone with me and reviewed all of the pts medications in the home and this medication was not in the home and i have called the [facility] and [facility] that the pt uses to fill her meds and both places have never filled this medication.  Daughter is aware that i will let SN know about this and we will remove the metformin from her list.  Nothing further is needed.

## 2012-09-25 ENCOUNTER — Other Ambulatory Visit: Payer: Self-pay | Admitting: Pulmonary Disease

## 2012-09-25 MED ORDER — ALPRAZOLAM 0.5 MG PO TABS: ORAL_TABLET | ORAL | Status: DC

## 2012-09-30 ENCOUNTER — Telehealth: Payer: Self-pay | Admitting: Pulmonary Disease

## 2012-09-30 NOTE — Telephone Encounter (Signed)
Spoke with pt's daughter - PT c/o non prod cough x 1 week - Chest congestion - Runny nose - Denies fcs, wheezing or sob - Taking Coricidin HBP -  Cough is worse today - Appt given with Tammy Parrett 10-01-12 at 11:45.

## 2012-10-01 ENCOUNTER — Encounter: Payer: Self-pay | Admitting: Adult Health

## 2012-10-01 ENCOUNTER — Ambulatory Visit (INDEPENDENT_AMBULATORY_CARE_PROVIDER_SITE_OTHER): Payer: Medicare Other | Admitting: Adult Health

## 2012-10-01 VITALS — BP 140/90 | HR 81 | Temp 97.4°F | Ht 63.5 in | Wt 184.0 lb

## 2012-10-01 DIAGNOSIS — J209 Acute bronchitis, unspecified: Secondary | ICD-10-CM

## 2012-10-01 MED ORDER — AZITHROMYCIN 250 MG PO TABS: ORAL_TABLET | ORAL | Status: AC

## 2012-10-01 MED ORDER — HYDROCODONE-HOMATROPINE 5-1.5 MG/5ML PO SYRP: 5.0000 mL | ORAL_SOLUTION | Freq: Four times a day (QID) | ORAL | Status: DC | PRN

## 2012-10-01 NOTE — Patient Instructions (Addendum)
Zpack take as directed  Mucinex DM Twice daily  As needed  Cough/congestion  Fluids and rest Hydromet 1/2 tsp every 8 hr As needed  For cough , may make you sleepy.  Please contact office for sooner follow up if symptoms do not improve or worsen or seek emergency care

## 2012-10-01 NOTE — Progress Notes (Signed)
HPI  79 y/o WF here  ..   ~  Dec 18, 2010: 482mo ROV & reasonably stable>  She saw DrRamos 11/11 for LBP w/ eval revealing multilevel DDD & she has an epid steriod injection w/ some temp benefit & she uses OTC meds for pain relief...  She had Cards f/u DrRoss 12/11 & was felt to be stable, no angina, no SOB, & no change in med Rx...  She had a Neuro eval from DrWillis 1/12 & he agreed w/ Aricept Rx & will consider adding Namenda later; he rec stopping the Amitriptyline & they feel her paresthesias are sl better off this med...  She was also treated for a UTI 3/12 by TP w/ a pansens EColi (given Cipro250 w/ resolution)...  ~  Jan 04, 2011:  2 week ROV & post-hosp check> she was visiting a relative in the hosp 12/27/10 when she felt light headed & fainted briefly (fell back into a chair) then confused- taken to ER w/ syncope & Adm for 2d> CT Br showed atrophy, sm vessel dis, vasc calcif, NAD;  EEG was WNL;  CDopplers were neg x for mild plaque- no extracranial carotid dis;  EKG (in ER) w/ [id number], NSSTTWA;  2DEcho showed normal LVF (55-60%) & no wall motion abn, essent norm valves etc;  Labs were norm as well... Disch on same meds> since disch she's been sl weak & BP at home sl low (home health said ~100/50 & we decr her Exforge)...  We decided to cut back slightly on her BP Rx & monitor BP closely at home> rec to take 1/2 of the Exforge & 1/2 of the ToprolXL.  ~  March 30, 2011:  28mo ROV & they note BPs at home have been pretty good & review of log book shows 120-140/ 70-80 range; daughter note incr forgetful & she's on Aricept (refilled today) & review of DrWillis note indicated low threshold for adding Namenda & they are in agreement;  Otherwise stable> 122/76 today; denies CP, palpit, syncope, ch in DOE, edema; denies cerebral ischemic symptoms; reviewed FLP, TSH, other labs> stable, no changes made...  ~  August 23, 2011:  82mo ROV & her CC is that her fluid pill is too strong & she urinates too freq,  want to decr the Lasix 40mg /d to 1/2 tab daily & we have given her a range of 1/2 to 1 tab daily based on BP & fluid retention problems... We reviewed her prev labs, XRays, and scans> she tells me that she has upcoming appts w/ DrRoss for Cards, DrNorris for Ortho, DrWillis for Neurology...  She saw DrWillis 8/12 w/ decr memory, numbness in hands & feet, syncopal spell ?etiology etc; rec wrist splint trial & NCVs showed  No evid for periph neuropathy & borderline right CTS...  ~  Dec 13, 2011:  45mo ROV & Tateanna noted some recent dizziness & fell off her porch w/ mult bruises; she notes BP has been ok on meds & we cut the Lasix down to 1/2 tab Qam;  Since then her right leg has swollen > than left leg & we discussed checking VenDopplers to be sure there is no DVT==> Neg.    She saw DrRoss for Cards 1/13> f/u HBP, CAD, Dyslipidemia- felt to be doing ok & no changes made...    She also had f/u DrWillis for Neurology 2/13> Hx memory impairment (tolerating Aricept & Namenda well), prob CTS (try wrist splints); She had CTBrain 5/12 showing atrophy, sm vessel  dis, vasc calcif, no acute changes... LABS 5/13:  FLP- at goals on Simva40;  Chems- essen wnl;  CBC- wnl;  TSH=1.22;  Mg=2.0, Sed=24... Venous Dopplers 5/13:  Neg- no DVT or superficial thrombophlebitis...  ~  September 09, 2012:  72mo ROV & Maryclaire's CC= stiffness in her knees (R>L) after sitting for awhile, but otherw feeling pretty good she says;  She has seen DrNorris=> DrOlin & they ordered home therapy...    Breathing is stable; BP= 116/80 on MetopER50, Losar100, Lasix20, K20; also takes Imdur30-1/2 & ASA/Plavix- no CP, palpit, ch in SOB, edema, etc; She saw DrRoss 1/14> felt to be stable & no change made to her med regimen... Chol at goals on diet + Simva40; she has had some mild gluc intol but has been diet controlled w/o need for meds (Metform500 was listed incorrectly); BS=95...    She remains on Aricept & Namenda w/ memory about the same per family;  followed by DrWillis & felt to be stable... We reviewed prob list, meds, xrays and labs> see below for updates >> she had the 2013 flu vaccine 10/13; they did not bring med list or bottles to the OV today... LABS 1/14:  FLP- at goals on Simva40;  Chems- wnl w/ BS=95;  CBC- wnl;  TSH=0.48 on Levo75;  VitD=32 on supplement;  BNP=99  10/01/12 Acute OV  Complains of prod cough with white mucus, tightness in chest, head congestion w/ clear drainage, PND x1week - denies f/c/s, sore throat, wheezing.  taking corcidin hbp/cold&flu Not feeling any better No chest pain or edema        Problem List:  Hx of ASTHMATIC BRONCHITIS (ICD-466.0) - she is a never smoker;  breathing has been good & she denies cough, sputum, dyspnea, etc... ~  CXR 5/11 showed normal heart size w/ calcif in Ao, clear lungs x for bibasilar scarring/ atx, scoliosis & degen changes in spine...  HYPERTENSION (ICD-401.9) - on TOPROL XL 100- 1/2 daily,  LOSARTAN100 daily,  LASIX 40mg - 1/2daily, KCl 85mEq/d, & IMDUR 30mg - 1/2tab/d... ~  labs 4/11 showed normal BMet... & norm labs in ER 5/11 x BS=189. ~  11/11: BP= 116/80 & similar at home- notes reduced symptoms of lightheadedness & denies HA, visual changes, CP, palipit, syncope, edema, etc... ~  12/18/10:  BP= 134/78, feeling OK & denies symptoms... ~  01/04/11:  Post hosp check & BP 150/80 supine, 140/70 sitting, 130/70 standing (no symptoms)> decided to decr the ToprolXL & Exforge to 1/2 of each. ~  Since then BP checks at home & thru office have been good on above meds... ~  1/13:  BP= 122/72 & she has presistant mult somatic complaints... ~  5/13:  BP= 142/70 & she appears stable w/ mult minor complaints... ~  2/14:  BP= 116/80 & she is stable...  ARTERIOSCLEROTIC HEART DISEASE (ICD-414.00) - on IMDUR 30mg - 1/2 tab daily;  + ASA 81mg /d & PLAVIX 75mg /d...  ~  cath 1998 & 2003 w/ LAD lesion~50%, no other abn seen... ~  neg Myoview 11/08 w/o ischemia and EF=83%. ~  She follows up w/  DrRoss yearly... ~  saw DrRoss on 2/09 w/ review of 11/08 hosp for atypical CP...  doing satis without recurrent CP but too sedentary and difficulty getting around w/ her arthritis... ~  she had Cards f/u DrRoss 12/11 & was felt to be stable, no angina, no SOB, & no change in med Rx; EKG= NSR, WNL.Marland Kitchen. ~  5/12:  Hosp eval included> EKG (in ER)  w/ [id number], NSSTTWA;  2DEcho showed normal LVF (55-60%) & no wall motion abn, essent norm valves etc. ~  8/12:  CDopplers w/ mild smooth calcif plaque bilat, 0-39% bilat ICAstenoses, f/u suggested in 74yrs... ~  EKG 1/14 showed NSR, rate92, ?short PR, otherw normal EKG...  VENOUS INSUFFICIENCY, CHRONIC (ICD-459.81) - she has chronic venous insuffic changes in her LE's w/ edema, bruising, etc;  ~  she knows to elim sodium, elevate legs, wear support hose when able, etc... ~  5/13:  VenDopplers were neg- w/o DVT or superfic thrombosis  HYPERCHOLESTEROLEMIA (ICD-272.0) - on SIMVASTATIN 40mg /d & FISH OIL... ~  FLP 9/08 showed TChol 144, TG 204, HDL 34, LDL 72 ~  FLP 3/09 showed TChol 143, TG 178, HDL 33, LDL 74... rec- contin meds, incr exercise. ~  FLP 1/10 showed TChol 150, TG 162, HDL 39, LDL 79... DrRoss rec same meds, better diet. ~  FLP 7/10 showed TChol 143, TG 176, HDL 37, LDL 71 ~  FLP 4/11 showed TChol 154, TG 154, HDL 44, LDL 79 ~  FLP 3/12 showed TChol 150, TG 166, HDL 39, LDL 78 ~  FLP 8/12 on Simva40 showed TChol 143, TG 118, HDL 47, LDL 72 ~  FLP 5/13 on Simva40 showed TChol 143, TG 107, HDL 50, LDL 71 ~  FLP 1/14 on Simva40 showed TChol 146, TG 116, HDL 46, LDL 77  HYPOTHYROIDISM (ICD-244.9) - on LEVOTHYROID 20mcg/d... ~  labs 3/09 showed TSH= 0.98 ~  labs 7/10 showed TSH= 1.37 ~  labs 4/11 showed TSH= 1.39 ~  CDopplers 8/11 showed incidental thyroid nodule; subseq Thyroid Ultrasound showed mult bilat nodules & bx of dom lesion= non-neoplastic goiter> continue same med. ~  Labs 3/12 showed TSH= 1.16...  ~  Labs 5/12 in [place] showed  TSH=0.74, FreeT3=81 (80-204), FreeT4=1.36 (0.80-1.80) ~  Labs 5/13 on Levo75 showed TSH= 1.22 ~  Labs 1/14 on Levo75 showed TSH= 0.48  GERD (ICD-530.81) - not currently on PPI med & advised PRILOSEC 20mg  OTC Prn... ~  EGD by [facility] 6/06 showed HH, mild gastritis... ~  last saw [facility] Aug09 w/ Carafate added (off now).  DIVERTICULOSIS OF COLON (ICD-562.10) - followed by [facility] and last colonoscopy was 4/08 showing divertics, 2 sm polyps (tubular adenomas), & hems...  CHOLELITHIASIS (ICD-574.20) - seen on prev CTAbd 4/08... s/p lap chole 6/08 by DrHoxworth...  URINARY TRACT INFECTION, CHRONIC (ICD-599.0) - mult cultures in 2009 showed sens EColi & Rx w/ Cipro... we discussed the need for Urology eval if UTI's continue to recur (for further eval). ~  5/11:  ER eval for mult symptoms and found to have a UTI- treated w/ Bacterim & resolved. ~  3/12:  Saw TP w/ UTI from a pansens EColi & Rx w/ Cipro- resolved...  DEGENERATIVE JOINT DISEASE (ICD-715.90) - she has severe DJD w/ prev bilat THR, right TKR, and left femur fx w/ pinning 2003... c/o difficulty w/ ambulation, exercise, etc... she takes Glucoamine Bid + MVI etc... ~  labs 7/10 showed Vit D level = 31... rec> start Vit D 1000 u daily. ~  11/11:  c/o right leg pain & she saw DrRamos w/ eval revealing multilevel DDD & she has an epid steriod injection w/ some temp benefit. ~  5/12: she uses OTC meds for pain relief... ~  9/13:  Ortho did Nuclear bone scan> no evid of prob w/ the right knee prosthesis & DJD changes in left knee noted... ~  2/14:  Labs showed Vit d level =  32 & she is asked to incr supplement to 2000u/d...  TIA (ICD-435.9) & INTRACRANIAL ANEURYSM (ICD-437.3) - had left side numbness 1996- saw DrWeymann> MRI was neg... Rx w/ ASA. ~  seen by TParrett 4/09 w/ numbness & paresthesias- norm sed, B12, folate... improved w/ reassurance. 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 PLAVIX 75mg /d added. ~  ER 5/11 & 7/11> facial numbness & tingling w/ nothing found & she decided to stop the Aricept... CT Br w/ atrophy, sm vessel dis, atherosclerotic changes, NAD.Marland Kitchen. subseq MRA for f/u sm left paraophthalmic art aneurysm was unchanged... CDopplers 8/11 showed mild calcif plaque bilat, 0-39% bilat ICA stenoses, & incidental thyroid nodule. ~  ER & Hosp 5/12 for syncope> CT Br showed atrophy, sm vessel dis, vasc calcif, NAD;  EEG was WNL;  CDopplers were neg x for mild plaque- no extracranial carotid dis... ~  CDopplers at [facility] 8/12 showed stable mild plaque, 0-39% bilat ICA stenoses, f/u rec 40yrs...  MEMORY LOSS (ICD-780.93) - started on ARICEPT 2/09 and sl better over time... she does her own meds w/ daughter checking on her... ~  10/11:  pt had stopped Aricept on her own & offered restart vs Neuro consult> she decided to restart. ~  She had a Neuro eval from DrWillis 1/12 & he agreed w/ Aricept Rx & will consider adding Namenda later; he rec stopping the Amitriptyline & they feel her paresthesias are sl better off this med... ~  5/12:  CT Brain showed atrophy, sm vessel dis, vasc calcif, no ICH or infarct seen... ~  8/12:  Daughter concerned for her memory & some agitation; rec to add NAMENDA 10mg  1==>2 per day & she has f/u w/ Neuro soon. ~  8/12:  She had f/u DrWillis and stable on Aricept & Namenda; no changes made...  ANXIETY (ICD-300.00) - on ALPRAZOLAM 0.5mg  Bid Prn... ~  6/12: daughter thought she might be depressed & offered trial antidepressant med but they decline more meds at this time...  Hx of ANEMIA (ICD-285.9) - Hx of anemia in the past... Hg has remained WNL since then... ~  labs 7/10 showed Hg= 14.0 ~  labs 4/11 showed Hg= 14.7 ~  Labs 5/12 in [place] showed Hg= 13.9-14.7 ~  Labs 5/13 showed Hg= 12.9   Past Surgical History  Procedure Laterality Date  .  Laparoscopic cholecystectomy  01/2007    Dr. Odie Sera  . Abdominal hysterectomy    . Bilateral thr's    . Right total knee  11/1999    Dr. Leslee Home  . Left femur fracture  04/2002    Dr. Ranell Patrick  . Lumbar fusion      Outpatient Encounter Prescriptions as of 10/01/2012  Medication Sig Dispense Refill  . ALPRAZolam (XANAX) 0.5 MG tablet Take 1/2 to 1 tablet by mouth three times daily as needed for nerves  270 tablet  3  . aspirin 81 MG tablet Take 81 mg by mouth daily.        . Cholecalciferol (VITAMIN D) 2000 UNITS CAPS Take 1 capsule by mouth daily.      . clopidogrel (PLAVIX) 75 MG tablet Take 1 tablet (75 mg total) by mouth daily.  90 tablet  3  . dextromethorphan-guaiFENesin (MUCINEX DM) 30-600 MG per 12 hr tablet Take 2 tablets by mouth every 12 (twelve) hours. With plenty of fluids       .  donepezil (ARICEPT) 10 MG tablet Take 1 tablet (10 mg total) by mouth at bedtime as needed.  90 tablet  3  . fish oil-omega-3 fatty acids 1000 MG capsule Take 1 g by mouth daily.        . furosemide (LASIX) 40 MG tablet 1/2 tablet by mouth daily      . glucosamine-chondroitin 500-400 MG tablet Take 1 tablet by mouth daily.        . isosorbide mononitrate (IMDUR) 30 MG 24 hr tablet Take 1/2 tablet by mouth once daily  45 tablet  3  . levothyroxine (SYNTHROID, LEVOTHROID) 75 MCG tablet Take 1 tablet (75 mcg total) by mouth daily.  90 tablet  3  . losartan (COZAAR) 100 MG tablet Take 1 tablet (100 mg total) by mouth daily.  90 tablet  3  . memantine (NAMENDA) 10 MG tablet Take 1 tablet (10 mg total) by mouth 2 (two) times daily.  180 tablet  3  . metoprolol succinate (TOPROL-XL) 100 MG 24 hr tablet Take 1/2 tablet by mouth once daily  45 tablet  3  . Multiple Vitamins-Minerals (CENTRUM SILVER PO) Take 1 tablet by mouth daily.        . potassium chloride SA (K-DUR,KLOR-CON) 20 MEQ tablet Take 1 tablet (20 mEq total) by mouth daily.  90 tablet  3  . simvastatin (ZOCOR) 40 MG tablet Take 1 tablet (40 mg  total) by mouth at bedtime.  90 tablet  3   No facility-administered encounter medications on file as of 10/01/2012.    Allergies  Allergen Reactions  . Morphine     Hallucinations   . Ofloxacin     Unable to remember    Current Medications, Allergies, Past Medical History, Past Surgical History, Family History, and Social History were reviewed in [employer] record.    Review of Systems     See HPI - all other systems neg except as noted.    The patient complains of decreased hearing, dyspnea on exertion, muscle weakness, and difficulty walking.  The patient denies anorexia, fever, weight loss, weight gain, vision loss, hoarseness, chest pain, syncope, peripheral edema, prolonged cough, headaches, hemoptysis, abdominal pain, melena, hematochezia, severe indigestion/heartburn, hematuria, incontinence, suspicious skin lesions, transient blindness, depression, unusual weight change, abnormal bleeding, enlarged lymph nodes, and angioedema.     Physical Exam   WD, Overweight, 79 y/o WF in NAD... GENERAL:  Alert & oriented; pleasant & cooperative... HEENT:  [place]/AT,  EACs-clear, TMs-wnl, NOSE-clear, THROAT-clear & wnl. NECK:  Supple w/ fairROM; no JVD; normal carotid impulses w/o bruits; no thyromegaly or nodules palpated; no lymphadenopathy. CHEST:  Clear to P & A; without wheezes/ rales/ or rhonchi heard... HEART:  Regular Rhythm; without murmurs/ rubs/ or gallops detected... ABDOMEN:  Soft & nontender; normal bowel sounds; no organomegaly or masses palpated... EXT:  mod arthritic changes; +varicose veins/ +venous insuffic/ tr edema;  intact pulses in LE's... NEURO:  ; no focal neuro deficit... DERM:  No lesions noted; no rash etc..Marland Kitchen

## 2012-10-03 NOTE — Assessment & Plan Note (Signed)
Zpack take as directed  Mucinex DM Twice daily  As needed  Cough/congestion  Fluids and rest Hydromet 1/2 tsp every 8 hr As needed  For cough , may make you sleepy.  Please contact office for sooner follow up if symptoms do not improve or worsen or seek emergency care   

## 2012-11-14 ENCOUNTER — Encounter (HOSPITAL_COMMUNITY): Payer: Self-pay | Admitting: Emergency Medicine

## 2012-11-14 ENCOUNTER — Emergency Department (HOSPITAL_COMMUNITY): Payer: Medicare Other

## 2012-11-14 ENCOUNTER — Other Ambulatory Visit: Payer: Self-pay | Admitting: Pulmonary Disease

## 2012-11-14 ENCOUNTER — Inpatient Hospital Stay (HOSPITAL_COMMUNITY)
Admission: EM | Admit: 2012-11-14 | Discharge: 2012-11-17 | DRG: 536 | Disposition: A | Discharge status: Skilled Nursing Facility | Payer: Medicare Other | Attending: Orthopedic Surgery | Admitting: Orthopedic Surgery

## 2012-11-14 DIAGNOSIS — J45909 Unspecified asthma, uncomplicated: Secondary | ICD-10-CM | POA: Diagnosis present

## 2012-11-14 DIAGNOSIS — E7800 Pure hypercholesterolemia, unspecified: Secondary | ICD-10-CM | POA: Diagnosis present

## 2012-11-14 DIAGNOSIS — W19XXXA Unspecified fall, initial encounter: Secondary | ICD-10-CM | POA: Diagnosis present

## 2012-11-14 DIAGNOSIS — Z8673 Personal history of transient ischemic attack (TIA), and cerebral infarction without residual deficits: Secondary | ICD-10-CM

## 2012-11-14 DIAGNOSIS — E042 Nontoxic multinodular goiter: Secondary | ICD-10-CM | POA: Diagnosis present

## 2012-11-14 DIAGNOSIS — F411 Generalized anxiety disorder: Secondary | ICD-10-CM | POA: Diagnosis present

## 2012-11-14 DIAGNOSIS — S72009A Fracture of unspecified part of neck of unspecified femur, initial encounter for closed fracture: Principal | ICD-10-CM | POA: Diagnosis present

## 2012-11-14 DIAGNOSIS — I2510 Atherosclerotic heart disease of native coronary artery without angina pectoris: Secondary | ICD-10-CM | POA: Diagnosis present

## 2012-11-14 DIAGNOSIS — S7291XA Unspecified fracture of right femur, initial encounter for closed fracture: Secondary | ICD-10-CM

## 2012-11-14 DIAGNOSIS — Z79899 Other long term (current) drug therapy: Secondary | ICD-10-CM

## 2012-11-14 DIAGNOSIS — Z96649 Presence of unspecified artificial hip joint: Secondary | ICD-10-CM

## 2012-11-14 DIAGNOSIS — I671 Cerebral aneurysm, nonruptured: Secondary | ICD-10-CM | POA: Diagnosis present

## 2012-11-14 DIAGNOSIS — K219 Gastro-esophageal reflux disease without esophagitis: Secondary | ICD-10-CM | POA: Diagnosis present

## 2012-11-14 DIAGNOSIS — I10 Essential (primary) hypertension: Secondary | ICD-10-CM | POA: Diagnosis present

## 2012-11-14 DIAGNOSIS — I872 Venous insufficiency (chronic) (peripheral): Secondary | ICD-10-CM | POA: Diagnosis present

## 2012-11-14 DIAGNOSIS — Z8719 Personal history of other diseases of the digestive system: Secondary | ICD-10-CM

## 2012-11-14 DIAGNOSIS — E039 Hypothyroidism, unspecified: Secondary | ICD-10-CM | POA: Diagnosis present

## 2012-11-14 DIAGNOSIS — Z96659 Presence of unspecified artificial knee joint: Secondary | ICD-10-CM

## 2012-11-14 MED ORDER — LACTATED RINGERS IV SOLN
INTRAVENOUS | Status: DC
  Administered 2012-11-14 – 2012-11-16 (×3): via INTRAVENOUS

## 2012-11-14 MED ORDER — HYDROMORPHONE HCL PF 1 MG/ML IJ SOLN: 1.0000 mg | INTRAMUSCULAR | Status: DC | PRN

## 2012-11-14 MED ORDER — LOSARTAN POTASSIUM 50 MG PO TABS
100.0000 mg | ORAL_TABLET | Freq: Every day | ORAL | Status: DC
  Administered 2012-11-15 – 2012-11-17 (×3): 100 mg via ORAL
  Filled 2012-11-14 (×3): qty 2

## 2012-11-14 MED ORDER — SODIUM CHLORIDE 0.9 % IJ SOLN
3.0000 mL | Freq: Two times a day (BID) | INTRAMUSCULAR | Status: DC
  Administered 2012-11-14 – 2012-11-16 (×4): 3 mL via INTRAVENOUS

## 2012-11-14 MED ORDER — OMEGA-3-ACID ETHYL ESTERS 1 G PO CAPS
1.0000 g | ORAL_CAPSULE | Freq: Every day | ORAL | Status: DC
  Administered 2012-11-15 – 2012-11-17 (×3): 1 g via ORAL
  Filled 2012-11-14 (×3): qty 1

## 2012-11-14 MED ORDER — SODIUM CHLORIDE 0.9 % IJ SOLN: 3.0000 mL | INTRAMUSCULAR | Status: DC | PRN

## 2012-11-14 MED ORDER — OXYCODONE HCL 5 MG PO TABS
5.0000 mg | ORAL_TABLET | ORAL | Status: DC | PRN
  Administered 2012-11-15: 5 mg via ORAL
  Filled 2012-11-14: qty 1

## 2012-11-14 MED ORDER — ACETAMINOPHEN 650 MG RE SUPP: 650.0000 mg | Freq: Four times a day (QID) | RECTAL | Status: DC | PRN

## 2012-11-14 MED ORDER — FLEET ENEMA 7-19 GM/118ML RE ENEM: 1.0000 | ENEMA | Freq: Once | RECTAL | Status: AC | PRN

## 2012-11-14 MED ORDER — SIMVASTATIN 40 MG PO TABS
40.0000 mg | ORAL_TABLET | Freq: Every day | ORAL | Status: DC
  Administered 2012-11-14 – 2012-11-16 (×3): 40 mg via ORAL
  Filled 2012-11-14 (×4): qty 1

## 2012-11-14 MED ORDER — CLOPIDOGREL BISULFATE 75 MG PO TABS
75.0000 mg | ORAL_TABLET | Freq: Every day | ORAL | Status: DC
  Administered 2012-11-15 – 2012-11-17 (×3): 75 mg via ORAL
  Filled 2012-11-14 (×4): qty 1

## 2012-11-14 MED ORDER — VITAMIN D 1000 UNITS PO TABS
2000.0000 [IU] | ORAL_TABLET | Freq: Every day | ORAL | Status: DC
  Administered 2012-11-15 – 2012-11-17 (×3): 2000 [IU] via ORAL
  Filled 2012-11-14 (×3): qty 2

## 2012-11-14 MED ORDER — MEMANTINE HCL 10 MG PO TABS: 10.0000 mg | ORAL_TABLET | Freq: Two times a day (BID) | ORAL | Status: DC

## 2012-11-14 MED ORDER — LEVOTHYROXINE SODIUM 75 MCG PO TABS
75.0000 ug | ORAL_TABLET | Freq: Every day | ORAL | Status: DC
  Administered 2012-11-15 – 2012-11-17 (×3): 75 ug via ORAL
  Filled 2012-11-14 (×4): qty 1

## 2012-11-14 MED ORDER — POTASSIUM CHLORIDE CRYS ER 20 MEQ PO TBCR
20.0000 meq | EXTENDED_RELEASE_TABLET | Freq: Every day | ORAL | Status: DC
  Administered 2012-11-15 – 2012-11-17 (×3): 20 meq via ORAL
  Filled 2012-11-14 (×3): qty 1

## 2012-11-14 MED ORDER — FUROSEMIDE 20 MG PO TABS
20.0000 mg | ORAL_TABLET | Freq: Every day | ORAL | Status: DC
  Administered 2012-11-15 – 2012-11-17 (×3): 20 mg via ORAL
  Filled 2012-11-14 (×3): qty 1

## 2012-11-14 MED ORDER — DONEPEZIL HCL 10 MG PO TABS
10.0000 mg | ORAL_TABLET | Freq: Every day | ORAL | Status: DC
  Administered 2012-11-15 – 2012-11-16 (×2): 10 mg via ORAL
  Filled 2012-11-14 (×3): qty 1

## 2012-11-14 MED ORDER — FUROSEMIDE 40 MG PO TABS: 40.0000 mg | ORAL_TABLET | Freq: Every day | ORAL | Status: DC

## 2012-11-14 MED ORDER — OMEGA-3 FATTY ACIDS 1000 MG PO CAPS: 1.0000 g | ORAL_CAPSULE | Freq: Every day | ORAL | Status: DC

## 2012-11-14 MED ORDER — POLYETHYLENE GLYCOL 3350 17 G PO PACK
17.0000 g | PACK | Freq: Every day | ORAL | Status: DC | PRN
  Administered 2012-11-16: 17 g via ORAL
  Filled 2012-11-14: qty 1

## 2012-11-14 MED ORDER — BISACODYL 10 MG RE SUPP: 10.0000 mg | Freq: Every day | RECTAL | Status: DC | PRN

## 2012-11-14 MED ORDER — ASPIRIN 81 MG PO CHEW
81.0000 mg | CHEWABLE_TABLET | Freq: Every day | ORAL | Status: DC
  Administered 2012-11-15 – 2012-11-17 (×3): 81 mg via ORAL
  Filled 2012-11-14 (×3): qty 1

## 2012-11-14 MED ORDER — ALPRAZOLAM 0.5 MG PO TABS
0.5000 mg | ORAL_TABLET | Freq: Three times a day (TID) | ORAL | Status: DC | PRN
  Administered 2012-11-14 – 2012-11-17 (×4): 0.5 mg via ORAL
  Filled 2012-11-14 (×4): qty 1

## 2012-11-14 MED ORDER — POTASSIUM CHLORIDE CRYS ER 20 MEQ PO TBCR: 20.0000 meq | EXTENDED_RELEASE_TABLET | Freq: Every day | ORAL | Status: AC

## 2012-11-14 MED ORDER — ONDANSETRON HCL 4 MG PO TABS: 4.0000 mg | ORAL_TABLET | Freq: Four times a day (QID) | ORAL | Status: DC | PRN

## 2012-11-14 MED ORDER — LEVOTHYROXINE SODIUM 75 MCG PO TABS: 75.0000 ug | ORAL_TABLET | Freq: Every day | ORAL | Status: DC

## 2012-11-14 MED ORDER — ONDANSETRON HCL 4 MG/2ML IJ SOLN: 4.0000 mg | Freq: Four times a day (QID) | INTRAMUSCULAR | Status: DC | PRN

## 2012-11-14 MED ORDER — SODIUM CHLORIDE 0.9 % IV SOLN: 250.0000 mL | INTRAVENOUS | Status: DC | PRN

## 2012-11-14 MED ORDER — ISOSORBIDE MONONITRATE ER 30 MG PO TB24
30.0000 mg | ORAL_TABLET | Freq: Every day | ORAL | Status: DC
  Administered 2012-11-15 – 2012-11-17 (×3): 30 mg via ORAL
  Filled 2012-11-14 (×3): qty 1

## 2012-11-14 MED ORDER — METOPROLOL SUCCINATE ER 50 MG PO TB24
50.0000 mg | ORAL_TABLET | Freq: Every day | ORAL | Status: DC
  Administered 2012-11-14 – 2012-11-16 (×3): 50 mg via ORAL
  Filled 2012-11-14 (×4): qty 1

## 2012-11-14 MED ORDER — ISOSORBIDE MONONITRATE ER 30 MG PO TB24: ORAL_TABLET | ORAL | Status: AC

## 2012-11-14 MED ORDER — ACETAMINOPHEN 325 MG PO TABS
650.0000 mg | ORAL_TABLET | Freq: Four times a day (QID) | ORAL | Status: DC | PRN
  Administered 2012-11-15: 650 mg via ORAL
  Filled 2012-11-14: qty 2

## 2012-11-14 MED ORDER — MEMANTINE HCL 10 MG PO TABS
10.0000 mg | ORAL_TABLET | Freq: Two times a day (BID) | ORAL | Status: DC
  Administered 2012-11-14 – 2012-11-17 (×6): 10 mg via ORAL
  Filled 2012-11-14 (×7): qty 1

## 2012-11-14 NOTE — ED Notes (Addendum)
Per EMS-pt c/o fall early this morning. Pain 10/10 to knee when bearing weight. Pt found on floor by daughter unable to get up. NAD at this time.   Addendum:  Right leg pain..the patient unable to rate pain, just states that it's constant pain.

## 2012-11-14 NOTE — ED Provider Notes (Addendum)
History     CSN: [id number]  Arrival date & time 11/14/12  1728   First MD Initiated Contact with Patient 11/14/12 1740      Chief Complaint  Patient presents with  . Fall  . Knee Pain    (Consider location/radiation/quality/duration/timing/severity/associated sxs/prior treatment) Patient is a 79 y.o. female presenting with fall. The history is provided by the patient (pt fell today and hurt her right hip).  Fall The accident occurred 6 to 12 hours ago. The fall occurred while walking. She fell from a height of 1 to 2 ft. She landed on a hard floor. Point of impact: right hip. The pain is at a severity of 5/10. The pain is moderate. She was not ambulatory at the scene. There was no entrapment after the fall. Pertinent negatives include no abdominal pain, no hematuria and no headaches. The symptoms are aggravated by activity. The treatment provided moderate relief.    Past Medical History  Diagnosis Date  . Allergic rhinitis   . Asthmatic bronchitis   . Hypertension   . Arteriosclerotic heart disease   . Venous insufficiency (chronic) (peripheral)   . Hypercholesteremia   . Hypothyroid   . Nontoxic multinodular goiter   . GERD (gastroesophageal reflux disease)   . Diverticulosis of colon   . Cholelithiasis   . Chronic UTI   . DJD (degenerative joint disease)   . TIA (transient ischemic attack)   . Memory loss   . Intracranial aneurysm   . Anxiety   . Anemia     Past Surgical History  Procedure Laterality Date  . Laparoscopic cholecystectomy  01/2007    Dr. Odie Sera  . Abdominal hysterectomy    . Bilateral thr's    . Right total knee  11/1999    Dr. Leslee Home  . Left femur fracture  04/2002    Dr. Ranell Patrick  . Lumbar fusion      No family history on file.  History  Substance Use Topics  . Smoking status: Never Smoker   . Smokeless tobacco: Never Used  . Alcohol Use: No    OB History   Grav Para Term Preterm Abortions TAB SAB Ect Mult Living                   Review of Systems  Constitutional: Negative for fatigue.  HENT: Negative for congestion, sinus pressure and ear discharge.   Eyes: Negative for discharge.  Respiratory: Negative for cough.   Cardiovascular: Negative for chest pain.  Gastrointestinal: Negative for abdominal pain and diarrhea.  Genitourinary: Negative for frequency and hematuria.  Musculoskeletal: Negative for back pain.       Right hip pain  Skin: Negative for rash.  Neurological: Negative for seizures and headaches.  Psychiatric/Behavioral: Negative for hallucinations.    Allergies  Morphine and Ofloxacin  Home Medications   Current Outpatient Rx  Name  Route  Sig  Dispense  Refill  . ALPRAZolam (XANAX) 0.5 MG tablet      Take 1/2 to 1 tablet by mouth three times daily as needed for nerves   270 tablet   3   . aspirin 81 MG tablet   Oral   Take 81 mg by mouth daily.           . Cholecalciferol (VITAMIN D) 2000 UNITS CAPS   Oral   Take 1 capsule by mouth daily.         . clopidogrel (PLAVIX) 75 MG tablet   Oral   Take  1 tablet (75 mg total) by mouth daily.   90 tablet   3   . donepezil (ARICEPT) 10 MG tablet   Oral   Take 1 tablet (10 mg total) by mouth at bedtime as needed.   90 tablet   3   . fish oil-omega-3 fatty acids 1000 MG capsule   Oral   Take 1 g by mouth daily.           . furosemide (LASIX) 40 MG tablet   Oral   Take 1 tablet (40 mg total) by mouth daily. 1/2 tablet by mouth daily   90 tablet   3   . isosorbide mononitrate (IMDUR) 30 MG 24 hr tablet      Take 1/2 tablet by mouth once daily   45 tablet   3   . levothyroxine (SYNTHROID, LEVOTHROID) 75 MCG tablet   Oral   Take 1 tablet (75 mcg total) by mouth daily.   90 tablet   3   . losartan (COZAAR) 100 MG tablet   Oral   Take 1 tablet (100 mg total) by mouth daily.   90 tablet   3     This will replace the diovan 160mg  daily   . memantine (NAMENDA) 10 MG tablet   Oral   Take 1 tablet (10 mg  total) by mouth 2 (two) times daily.   180 tablet   3   . metoprolol succinate (TOPROL-XL) 100 MG 24 hr tablet   Oral   Take 100 mg by mouth at bedtime. Take 1/2 tablet by mouth once daily         . Multiple Vitamins-Minerals (CENTRUM SILVER PO)   Oral   Take 1 tablet by mouth daily.           . potassium chloride SA (K-DUR,KLOR-CON) 20 MEQ tablet   Oral   Take 1 tablet (20 mEq total) by mouth daily.   90 tablet   3   . simvastatin (ZOCOR) 40 MG tablet   Oral   Take 1 tablet (40 mg total) by mouth at bedtime.   90 tablet   3     BP 128/47  Pulse 66  Temp(Src) 97.5 F (36.4 C) (Oral)  Resp 16  SpO2 100%  Physical Exam  Constitutional: She is oriented to person, place, and time. She appears well-developed.  HENT:  Head: Normocephalic.  Eyes: Conjunctivae and EOM are normal. No scleral icterus.  Neck: Neck supple. No thyromegaly present.  Cardiovascular: Normal rate and regular rhythm.  Exam reveals no gallop and no friction rub.   No murmur heard. Pulmonary/Chest: No stridor. She has no wheezes. She has no rales. She exhibits no tenderness.  Abdominal: She exhibits no distension. There is no tenderness. There is no rebound.  Musculoskeletal: She exhibits no edema.  Tender right hip  Lymphadenopathy:    She has no cervical adenopathy.  Neurological: She is oriented to person, place, and time. Coordination normal.  Skin: No rash noted. No erythema.  Psychiatric: She has a normal mood and affect. Her behavior is normal.    ED Course  Procedures (including critical care time)  Labs Reviewed - No data to display Dg Hip Complete Right  11/14/2012  *RADIOLOGY REPORT*  Clinical Data: Fall, right hip pain  RIGHT HIP - COMPLETE 2+ VIEW  Comparison: None.  Findings: Right total hip arthroplasty.  Associated nondisplaced periprosthetic fracture involving the proximal femur.  Left total hip arthroplasty.  No evidence of hardware complication.  Prior ORIF of the distal  left femur, incompletely visualized.  IMPRESSION: Right total hip arthroplasty with nondisplaced periprosthetic fracture involving the proximal femur.  Left total hip arthroplasty without evidence of hardware complication.   Original Report Authenticated By: Charline Bills, M.D.    Dg Knee Complete 4 Views Right  11/14/2012  *RADIOLOGY REPORT*  Clinical Data: Fall, right knee pain  RIGHT KNEE - COMPLETE 4+ VIEW  Comparison: 04/19/2007  Findings: No fracture or dislocation is seen.  Right total knee arthroplasty.  No evidence of hardware fracture or loosening.  No suprapatellar knee joint effusion.  IMPRESSION: No fracture or dislocation is seen.  Right total knee arthroplasty without evidence of hardware complication.   Original Report Authenticated By: Charline Bills, M.D.      1. Femur fracture, right, closed, initial encounter      Dr. Lerry Paterson to admit pt MDM          Benny Lennert, MD 11/14/12 1610  Benny Lennert, MD 11/27/12 1158

## 2012-11-14 NOTE — ED Notes (Signed)
[id number]> Expected date:<BR> Expected time:<BR> Means of arrival:<BR> Comments:<BR> Ems/ elderly fall

## 2012-11-14 NOTE — ED Notes (Signed)
Spoke with radiology d/t wait time on x-rays.  Stated she is next in line.

## 2012-11-14 NOTE — H&P (Signed)
Andrea Mccoy is an 79 y.o. female.   Chief Complaint: Pain in Right Hip HPI: Larey Seat at home today.  Past Medical History  Diagnosis Date  . Allergic rhinitis   . Asthmatic bronchitis   . Hypertension   . Arteriosclerotic heart disease   . Venous insufficiency (chronic) (peripheral)   . Hypercholesteremia   . Hypothyroid   . Nontoxic multinodular goiter   . GERD (gastroesophageal reflux disease)   . Diverticulosis of colon   . Cholelithiasis   . Chronic UTI   . DJD (degenerative joint disease)   . TIA (transient ischemic attack)   . Memory loss   . Intracranial aneurysm   . Anxiety   . Anemia     Past Surgical History  Procedure Laterality Date  . Laparoscopic cholecystectomy  01/2007    Dr. Odie Sera  . Abdominal hysterectomy    . Bilateral thr's    . Right total knee  11/1999    Dr. Leslee Home  . Left femur fracture  04/2002    Dr. Ranell Patrick  . Lumbar fusion      No family history on file. Social History:  reports that she has never smoked. She has never used smokeless tobacco. She reports that she does not drink alcohol or use illicit drugs.  Allergies:  Allergies  Allergen Reactions  . Morphine     Hallucinations   . Ofloxacin     Unable to remember     (Not in a hospital admission)  No results found for this or any previous visit (from the past 48 hour(s)). Dg Hip Complete Right  11/14/2012  *RADIOLOGY REPORT*  Clinical Data: Fall, right hip pain  RIGHT HIP - COMPLETE 2+ VIEW  Comparison: None.  Findings: Right total hip arthroplasty.  Associated nondisplaced periprosthetic fracture involving the proximal femur.  Left total hip arthroplasty.  No evidence of hardware complication.  Prior ORIF of the distal left femur, incompletely visualized.  IMPRESSION: Right total hip arthroplasty with nondisplaced periprosthetic fracture involving the proximal femur.  Left total hip arthroplasty without evidence of hardware complication.   Original Report Authenticated By:  Charline Bills, M.D.    Dg Knee Complete 4 Views Right  11/14/2012  *RADIOLOGY REPORT*  Clinical Data: Fall, right knee pain  RIGHT KNEE - COMPLETE 4+ VIEW  Comparison: 04/19/2007  Findings: No fracture or dislocation is seen.  Right total knee arthroplasty.  No evidence of hardware fracture or loosening.  No suprapatellar knee joint effusion.  IMPRESSION: No fracture or dislocation is seen.  Right total knee arthroplasty without evidence of hardware complication.   Original Report Authenticated By: Charline Bills, M.D.     Review of Systems  Constitutional: Negative.   HENT: Negative.   Eyes: Negative.   Respiratory: Negative.   Cardiovascular: Negative.   Gastrointestinal: Negative.   Genitourinary: Negative.   Musculoskeletal: Positive for joint pain.  Skin: Positive for rash.  Neurological: Negative.   Endo/Heme/Allergies: Negative.   Psychiatric/Behavioral: Positive for memory loss.    Blood pressure 128/47, pulse 66, temperature 97.5 F (36.4 C), temperature source Oral, resp. rate 16, SpO2 100.00%. Physical Exam  Constitutional: She appears well-developed.  HENT:  Head: Normocephalic.  Eyes: Pupils are equal, round, and reactive to light.  Neck: Normal range of motion.  Cardiovascular: Normal rate.   Respiratory: Effort normal.  GI: Soft.  Musculoskeletal: She exhibits tenderness.  Painful motion of Right Hip .Good Dorsalis Pedis Pulses. Venous Stasis changes of both legs.  Neurological: Coordination abnormal.  Dementia.  Skin: There is erythema.  Psychiatric: Cognition and memory are impaired. She expresses inappropriate judgment.     Assessment/Plan Will admit and observe at bed rest. Will have PT evaluate and consider SNF.   Helios Kohlmann A 11/14/2012, 9:08 PM

## 2012-11-15 LAB — CBC WITH DIFFERENTIAL/PLATELET
Basophils Absolute: 0 10*3/uL (ref 0.0–0.1)
Basophils Relative: 0 % (ref 0–1)
Eosinophils Absolute: 0.1 10*3/uL (ref 0.0–0.7)
Eosinophils Relative: 1 % (ref 0–5)
HCT: 36.4 % (ref 36.0–46.0)
Hemoglobin: 12.3 g/dL (ref 12.0–15.0)
Lymphocytes Relative: 19 % (ref 12–46)
Lymphs Abs: 1.4 10*3/uL (ref 0.7–4.0)
MCH: 32.6 pg (ref 26.0–34.0)
MCHC: 33.8 g/dL (ref 30.0–36.0)
MCV: 96.6 fL (ref 78.0–100.0)
Monocytes Absolute: 0.7 10*3/uL (ref 0.1–1.0)
Monocytes Relative: 10 % (ref 3–12)
Neutro Abs: 5.1 10*3/uL (ref 1.7–7.7)
Neutrophils Relative %: 69 % (ref 43–77)
Platelets: 185 10*3/uL (ref 150–400)
RBC: 3.77 MIL/uL — ABNORMAL LOW (ref 3.87–5.11)
RDW: 13.5 % (ref 11.5–15.5)
WBC: 7.3 10*3/uL (ref 4.0–10.5)

## 2012-11-15 LAB — COMPREHENSIVE METABOLIC PANEL
ALT: 16 U/L (ref 0–35)
AST: 21 U/L (ref 0–37)
Albumin: 3.1 g/dL — ABNORMAL LOW (ref 3.5–5.2)
Alkaline Phosphatase: 79 U/L (ref 39–117)
BUN: 22 mg/dL (ref 6–23)
CO2: 26 mEq/L (ref 19–32)
Calcium: 9.4 mg/dL (ref 8.4–10.5)
Chloride: 106 mEq/L (ref 96–112)
Creatinine, Ser: 1 mg/dL (ref 0.50–1.10)
GFR calc Af Amer: 61 mL/min — ABNORMAL LOW (ref >90)
GFR calc non Af Amer: 53 mL/min — ABNORMAL LOW (ref >90)
Glucose, Bld: 122 mg/dL — ABNORMAL HIGH (ref 70–99)
Potassium: 3.7 mEq/L (ref 3.5–5.1)
Sodium: 140 mEq/L (ref 135–145)
Total Bilirubin: 0.5 mg/dL (ref 0.3–1.2)
Total Protein: 5.7 g/dL — ABNORMAL LOW (ref 6.0–8.3)

## 2012-11-15 NOTE — Progress Notes (Signed)
Clinical Social Work Department BRIEF PSYCHOSOCIAL ASSESSMENT 11/15/2012  Patient:  Andrea Mccoy, Andrea Mccoy     Account Number:  [id number]     Admit date:  11/14/2012  Clinical Social Worker:  Doroteo Glassman  Date/Time:  11/15/2012 12:02 PM  Referred by:  Physician  Date Referred:  11/15/2012 Referred for SNF Placement  Other Referral:   Interview type:  Patient Other interview type:   daughter at bedside   PSYCHOSOCIAL DATA Living Status:  ALONE Admitted from facility:   Level of care:   Primary support name:  Juanetta Gosling Primary support relationship to patient:  CHILD, ADULT Degree of support available:   strong   CURRENT CONCERNS Current Concerns Post-Acute Placement  Other Concerns:    SOCIAL WORK ASSESSMENT / PLAN Met with Pt and daughter to discuss d/c plans.  Pt and daughter agreeable to SNF and gave permission for CSW to initiate SNF search in [place]  Pt and daughter hopeful for [facility].  CSW provided Pt and daughter with [place] SNF list.  CSW thanked Pt and daughter for their time.  Assessment/plan status:  Psychosocial Support/Ongoing Assessment of Needs Other assessment/ plan:   Information/referral to community resources:   [facility] SNF list   PATIENT'S/FAMILY'S RESPONSE TO PLAN OF CARE: Pt and daughter thanked CSW for time and assistance.  [name], LCSWA Clinical Social Work [phone]

## 2012-11-15 NOTE — Progress Notes (Signed)
Subjective:     Patient reports pain as 5 on 0-10 scale.Pain with motion right hip. Will get up tomorrow in chair only.    Objective: Vital signs in last 24 hours: Temp:  [97.5 F (36.4 C)-98.9 F (37.2 C)] 98.9 F (37.2 C) (04/12 0605) Pulse Rate:  [65-74] 65 (04/12 0605) Resp:  [14-16] 14 (04/12 0605) BP: (128-144)/(47-86) 144/86 mmHg (04/12 0605) SpO2:  [96 %-100 %] 98 % (04/12 0605) Weight:  [83.462 kg (184 lb)] 83.462 kg (184 lb) (04/11 2300)  Intake/Output from previous day: 04/11 0701 - 04/12 0700 In: 731.3 [I.V.:731.3] Out: 700 [Urine:700] Intake/Output this shift:     Recent Labs  11/15/12 0422  HGB 12.3    Recent Labs  11/15/12 0422  WBC 7.3  RBC 3.77*  HCT 36.4  PLT 185    Recent Labs  11/15/12 0422  NA 140  K 3.7  CL 106  CO2 26  BUN 22  CREATININE 1.00  GLUCOSE 122*  CALCIUM 9.4   No results found for this basename: LABPT, INR,  in the last 72 hours  Dorsiflexion/Plantar flexion intact Compartment soft  Assessment/Plan:     Up with therapy,tomorow. Plan ion SNF tMonday.  Colleena Kurtenbach A 11/15/2012, 7:40 AM

## 2012-11-15 NOTE — Progress Notes (Signed)
Clinical Social Work Department BRIEF PSYCHOSOCIAL ASSESSMENT 11/15/2012  Patient:  Andrea Mccoy, Andrea Mccoy     Account Number:  [id number]     Admit date:  11/14/2012  Clinical Social Worker:  Doroteo Glassman  Date/Time:  11/15/2012 12:02 PM  Referred by:  Physician  Date Referred:  11/15/2012 Referred for SNF Placement  Other Referral:   Interview type:  Patient Other interview type:   daughter at bedside   PSYCHOSOCIAL DATA Living Status:  ALONE Admitted from facility:   Level of care:   Primary support name:  Juanetta Gosling Primary support relationship to patient:  CHILD, ADULT Degree of support available:   strong   CURRENT CONCERNS Current Concerns Post-Acute Placement  Other Concerns:    SOCIAL WORK ASSESSMENT / PLAN Met with Pt and daughter to discuss d/c plans.  Pt and daughter agreeable to SNF and gave permission for CSW to initiate SNF search in [place]  Pt and daughter hopeful for [facility].  CSW provided Pt and daughter with [place] SNF list.  CSW thanked Pt and daughter for their time.  Assessment/plan status:  Psychosocial Support/Ongoing Assessment of Needs Other assessment/ plan:   Information/referral to community resources:   [facility] SNF list   PATIENT'S/FAMILY'S RESPONSE TO PLAN OF CARE: Pt and daughter thanked CSW for time and assistance.  CSW to continue to follow.  [name], LCSWA Clinical Social Work [phone]

## 2012-11-15 NOTE — Evaluation (Signed)
Physical Therapy Evaluation Patient Details Name: Andrea Mccoy MRN: [id number] DOB: 1931/11/02 Today's Date: 11/15/2012 Time: 9563-8756 PT Time Calculation (min): 25 min  PT Assessment / Plan / Recommendation Clinical Impression  Pt presents with R non-displaced femur fracture with no surgical intervention.  She is NWB on RLE.  Tolerated sitting at EOB and sit to stand x 2, however requires +2 assist for all mobility.  Pt will benefit from skilled PT in acute venue to address deficits.  PT recommends SNF at D/C to maximize pts safety and function.     PT Assessment  Patient needs continued PT services    Follow Up Recommendations  SNF    Does the patient have the potential to tolerate intense rehabilitation      Barriers to Discharge Decreased caregiver support      Equipment Recommendations  None recommended by PT    Recommendations for Other Services OT consult   Frequency Min 3X/week    Precautions / Restrictions Precautions Precautions: Fall Restrictions Weight Bearing Restrictions: Yes RLE Weight Bearing: Non weight bearing   Pertinent Vitals/Pain Pt with c/o pain during mobility, but no pain with rest.  Did not state number.       Mobility  Bed Mobility Bed Mobility: Supine to Sit;Sit to Supine;Sitting - Scoot to Edge of Bed Supine to Sit: 1: +2 Total assist;With rails;HOB elevated Supine to Sit: Patient Percentage: 30% Sitting - Scoot to Edge of Bed: 3: Mod assist Sit to Supine: 1: +2 Total assist;HOB flat Sit to Supine: Patient Percentage: 20% Details for Bed Mobility Assistance: Pt able to move LLE to EOB and initiate trunk movement, however requires assist for RLE and to get trunk into full sitting position.  Transfers Transfers: Sit to Stand;Stand to Sit Sit to Stand: 1: +2 Total assist;From elevated surface;With upper extremity assist;From bed Sit to Stand: Patient Percentage: 30% Stand to Sit: 1: +2 Total assist;With upper extremity assist;To  bed Stand to Sit: Patient Percentage: 30% Details for Transfer Assistance: Requires +2 assist to rise, steady and ensure controlled descent with max cues for hand placement and maintaining NWB status on RLE.  Pt unable to maintain upright posture despite max cues for hip and trunk extension.  Performed x 2, however unable to get to chair.   Ambulation/Gait Ambulation/Gait Assistance: Not tested (comment)    Exercises     PT Diagnosis: Difficulty walking;Generalized weakness;Acute pain  PT Problem List: Decreased strength;Decreased range of motion;Decreased activity tolerance;Decreased balance;Decreased mobility;Decreased coordination;Decreased cognition;Decreased knowledge of use of DME;Decreased safety awareness;Decreased knowledge of precautions;Pain PT Treatment Interventions: DME instruction;Functional mobility training;Therapeutic activities;Therapeutic exercise;Balance training;Patient/family education   PT Goals Acute Rehab PT Goals PT Goal Formulation: With patient Time For Goal Achievement: 11/22/12 Potential to Achieve Goals: Fair Pt will go Supine/Side to Sit: with mod assist PT Goal: Supine/Side to Sit - Progress: Goal set today Pt will go Sit to Supine/Side: with mod assist PT Goal: Sit to Supine/Side - Progress: Goal set today Pt will go Sit to Stand: with max assist PT Goal: Sit to Stand - Progress: Goal set today Pt will go Stand to Sit: with max assist PT Goal: Stand to Sit - Progress: Goal set today Pt will Transfer Bed to Chair/Chair to Bed: with +2 total assist;Other (comment) (Pt assist 50%) PT Transfer Goal: Bed to Chair/Chair to Bed - Progress: Goal set today  Visit Information  Last PT Received On: 11/22/12 Assistance Needed: +2 (bed to chair only)    Subjective Data  Subjective:  I'm just scared I'm going to hurt it even more.  Patient Stated Goal: to get OOB.   Prior Functioning  Home Living Lives With: Daughter Available Help at Discharge: Skilled  Nursing Facility Additional Comments: Pt will be D/Cing to [facility] for rehab.  Prior Function Comments: Pt was using RW at home.     Cognition  Cognition Overall Cognitive Status: No family/caregiver present to determine baseline cognitive functioning Arousal/Alertness: Awake/alert Orientation Level: Appears intact for tasks assessed Behavior During Session: [facility] for tasks performed Cognition - Other Comments: Pt mostly WFL during session, however she does have hx of dementia.     Extremity/Trunk Assessment Right Lower Extremity Assessment RLE ROM/Strength/Tone: Deficits;Unable to fully assess;Due to precautions RLE ROM/Strength/Tone Deficits: AAROM knee flex approx 50 deg, 2/5 hip abd RLE Sensation: WFL - Light Touch Left Lower Extremity Assessment LLE ROM/Strength/Tone: WFL for tasks assessed LLE Sensation: WFL - Light Touch Trunk Assessment Trunk Assessment: Kyphotic   Balance    End of Session PT - End of Session Equipment Utilized During Treatment: Gait belt Activity Tolerance: Patient limited by pain Patient left: in bed;with call bell/phone within reach Nurse Communication: Mobility status;Need for lift equipment  GP     Vista Deck 11/15/2012, 3:58 PM

## 2012-11-16 MED ORDER — POLYETHYLENE GLYCOL 3350 17 G PO PACK: 17.0000 g | PACK | Freq: Every day | ORAL | Status: DC | PRN

## 2012-11-16 MED ORDER — OXYCODONE HCL 5 MG PO TABS: 5.0000 mg | ORAL_TABLET | ORAL | Status: DC | PRN

## 2012-11-16 NOTE — Progress Notes (Signed)
Utilization Review Completed.Andrea Mccoy T4/13/2014  

## 2012-11-16 NOTE — Progress Notes (Signed)
Andrea Mccoy  MRN: [id number] DOB/Age: [birth year] 79 y.o. Physician: Lynnea Maizes, M.D.      Subjective: Resting comfortably, good appetite. Vital Signs Temp:  [97.8 F (36.6 C)-98.4 F (36.9 C)] 98.4 F (36.9 C) (04/13 0500) Pulse Rate:  [62-65] 65 (04/13 0500) Resp:  [16] 16 (04/13 0500) BP: (134-154)/(70-82) 154/82 mmHg (04/13 0500) SpO2:  [94 %-99 %] 94 % (04/13 0500)  Lab Results  Recent Labs  11/15/12 0422  WBC 7.3  HGB 12.3  HCT 36.4  PLT 185   BMET  Recent Labs  11/15/12 0422  NA 140  K 3.7  CL 106  CO2 26  GLUCOSE 122*  BUN 22  CREATININE 1.00  CALCIUM 9.4   INR  Date Value Range Status  06/26/2007 0.9 DISREGARD RESULTS   Final     Exam  Sitting in  Bed NAD. Right thigh diffusely tender, grossly N/V intact. Was OOB with PT yesterday max assist  Plan To SNF. continue NWB RLE  Kristina Mcnorton M 11/16/2012, 8:49 AM

## 2012-11-16 NOTE — Progress Notes (Signed)
Nutrition Brief Note  Patient identified on the Malnutrition Screening Tool (MST) Report  Body mass index is 29.71 kg/(m^2). Patient meets criteria for Overweight based on current BMI.   Current diet order is heart healthy, patient is consuming approximately 90-100% of meals at this time. Labs and medications reviewed.   Both pt and nurse report that intake has been good and that there has been no weight loss. Pt says that she eats too much in her opinion and that her appetite is good. She requires assistance with ordering meals, which has been requested. She asked me to help her order her lunch while in room which I did.  No nutrition interventions warranted at this time. If nutrition issues arise, please consult RD.   Ebbie Latus RD, LDN

## 2012-11-16 NOTE — Discharge Summary (Signed)
Physician Discharge Summary   Patient ID: Andrea Mccoy MRN: [id number] DOB/AGE: [birth year] 79 y.o.  Admit date: 11/14/2012 Discharge date: 11/17/2012  Primary Diagnosis: Nondisplaced periprosthetic fracture right total hip  Admission Diagnoses:  Past Medical History  Diagnosis Date  . Allergic rhinitis   . Asthmatic bronchitis   . Hypertension   . Arteriosclerotic heart disease   . Venous insufficiency (chronic) (peripheral)   . Hypercholesteremia   . Hypothyroid   . Nontoxic multinodular goiter   . GERD (gastroesophageal reflux disease)   . Diverticulosis of colon   . Cholelithiasis   . Chronic UTI   . DJD (degenerative joint disease)   . TIA (transient ischemic attack)   . Memory loss   . Intracranial aneurysm   . Anxiety   . Anemia    Discharge Diagnoses:   Nondisplaced periprosthetic fracture right hip  Estimated body mass index is 29.71 kg/(m^2) as calculated from the following:   Height as of this encounter: 5\' 6"  (1.676 m).   Weight as of this encounter: 83.462 kg (184 lb).   Consults: None  HPI: The patient is a 79 year old female who presented to the ED after a fall at home resulting in a nondisplaced periprosthetic fracture of the right hip. She was admitted for pain control and awaited placement in SNF.   Laboratory Data: Admission on 11/14/2012  Component Date Value Range Status  . Sodium 11/15/2012 140  135 - 145 mEq/L Final  . Potassium 11/15/2012 3.7  3.5 - 5.1 mEq/L Final  . Chloride 11/15/2012 106  96 - 112 mEq/L Final  . CO2 11/15/2012 26  19 - 32 mEq/L Final  . Glucose, Bld 11/15/2012 122* 70 - 99 mg/dL Final  . BUN 40/98/1191 22  6 - 23 mg/dL Final  . Creatinine, Ser 11/15/2012 1.00  0.50 - 1.10 mg/dL Final  . Calcium 47/82/9562 9.4  8.4 - 10.5 mg/dL Final  . Total Protein 11/15/2012 5.7* 6.0 - 8.3 g/dL Final  . Albumin 13/03/6577 3.1* 3.5 - 5.2 g/dL Final  . AST 46/96/2952 21  0 - 37 U/L Final  . ALT 11/15/2012 16  0 - 35 U/L Final  .  Alkaline Phosphatase 11/15/2012 79  39 - 117 U/L Final  . Total Bilirubin 11/15/2012 0.5  0.3 - 1.2 mg/dL Final  . GFR calc non Af Amer 11/15/2012 53* >90 mL/min Final  . GFR calc Af Amer 11/15/2012 61* >90 mL/min Final   Comment:                                 The eGFR has been calculated                          using the CKD EPI equation.                          This calculation has not been                          validated in all clinical                          situations.  eGFR's persistently                          <90 mL/min signify                          possible Chronic Kidney Disease.  . WBC 11/15/2012 7.3  4.0 - 10.5 K/uL Final  . RBC 11/15/2012 3.77* 3.87 - 5.11 MIL/uL Final  . Hemoglobin 11/15/2012 12.3  12.0 - 15.0 g/dL Final  . HCT 21/30/8657 36.4  36.0 - 46.0 % Final  . MCV 11/15/2012 96.6  78.0 - 100.0 fL Final  . MCH 11/15/2012 32.6  26.0 - 34.0 pg Final  . MCHC 11/15/2012 33.8  30.0 - 36.0 g/dL Final  . RDW 84/69/6295 13.5  11.5 - 15.5 % Final  . Platelets 11/15/2012 185  150 - 400 K/uL Final  . Neutrophils Relative 11/15/2012 69  43 - 77 % Final  . Neutro Abs 11/15/2012 5.1  1.7 - 7.7 K/uL Final  . Lymphocytes Relative 11/15/2012 19  12 - 46 % Final  . Lymphs Abs 11/15/2012 1.4  0.7 - 4.0 K/uL Final  . Monocytes Relative 11/15/2012 10  3 - 12 % Final  . Monocytes Absolute 11/15/2012 0.7  0.1 - 1.0 K/uL Final  . Eosinophils Relative 11/15/2012 1  0 - 5 % Final  . Eosinophils Absolute 11/15/2012 0.1  0.0 - 0.7 K/uL Final  . Basophils Relative 11/15/2012 0  0 - 1 % Final  . Basophils Absolute 11/15/2012 0.0  0.0 - 0.1 K/uL Final     X-Rays:Dg Hip Complete Right  11/14/2012  *RADIOLOGY REPORT*  Clinical Data: Fall, right hip pain  RIGHT HIP - COMPLETE 2+ VIEW  Comparison: None.  Findings: Right total hip arthroplasty.  Associated nondisplaced periprosthetic fracture involving the proximal femur.  Left total hip arthroplasty.  No  evidence of hardware complication.  Prior ORIF of the distal left femur, incompletely visualized.  IMPRESSION: Right total hip arthroplasty with nondisplaced periprosthetic fracture involving the proximal femur.  Left total hip arthroplasty without evidence of hardware complication.   Original Report Authenticated By: Charline Bills, M.D.    Dg Knee Complete 4 Views Right  11/14/2012  *RADIOLOGY REPORT*  Clinical Data: Fall, right knee pain  RIGHT KNEE - COMPLETE 4+ VIEW  Comparison: 04/19/2007  Findings: No fracture or dislocation is seen.  Right total knee arthroplasty.  No evidence of hardware fracture or loosening.  No suprapatellar knee joint effusion.  IMPRESSION: No fracture or dislocation is seen.  Right total knee arthroplasty without evidence of hardware complication.   Original Report Authenticated By: Charline Bills, M.D.     EKG: Orders placed in visit on 08/25/12  . EKG 12-LEAD     Hospital Course: Andrea Mccoy is a 79 y.o. who was admitted to [facility] after presenting to the ED after a fall at home. Due to a nondisplaced periprosthetic fracture of her right total hip, she was made NWB. She was monitored over the weekend and awaited placement in skilled nursing facility. Patient had no medical issues during her stay.   Discharge Medications: Prior to Admission medications   Medication Sig Start Date End Date Taking? Authorizing Provider  ALPRAZolam Prudy Feeler) 0.5 MG tablet Take 1/2 to 1 tablet by mouth three times daily as needed for nerves 09/25/12 09/25/13 Yes Michele Mcalpine, MD  aspirin 81 MG tablet Take 81 mg by mouth daily.  Yes Historical Provider, MD  Cholecalciferol (VITAMIN D) 2000 UNITS CAPS Take 1 capsule by mouth daily.   Yes Historical Provider, MD  clopidogrel (PLAVIX) 75 MG tablet Take 1 tablet (75 mg total) by mouth daily. 08/14/12  Yes Michele Mcalpine, MD  donepezil (ARICEPT) 10 MG tablet Take 1 tablet (10 mg total) by mouth at bedtime as needed.  08/14/12 08/14/13 Yes Michele Mcalpine, MD  fish oil-omega-3 fatty acids 1000 MG capsule Take 1 g by mouth daily.     Yes Historical Provider, MD  furosemide (LASIX) 40 MG tablet Take 1 tablet (40 mg total) by mouth daily. 1/2 tablet by mouth daily 11/14/12 11/14/13 Yes Michele Mcalpine, MD  isosorbide mononitrate (IMDUR) 30 MG 24 hr tablet Take 1/2 tablet by mouth once daily 11/14/12  Yes Michele Mcalpine, MD  levothyroxine (SYNTHROID, LEVOTHROID) 75 MCG tablet Take 1 tablet (75 mcg total) by mouth daily. 11/14/12  Yes Michele Mcalpine, MD  losartan (COZAAR) 100 MG tablet Take 1 tablet (100 mg total) by mouth daily. 05/29/12  Yes Michele Mcalpine, MD  memantine (NAMENDA) 10 MG tablet Take 1 tablet (10 mg total) by mouth 2 (two) times daily. 11/14/12 11/14/13 Yes Michele Mcalpine, MD  metoprolol succinate (TOPROL-XL) 100 MG 24 hr tablet Take 100 mg by mouth at bedtime. Take 1/2 tablet by mouth once daily 04/18/12  Yes Michele Mcalpine, MD  Multiple Vitamins-Minerals (CENTRUM SILVER PO) Take 1 tablet by mouth daily.     Yes Historical Provider, MD  potassium chloride SA (K-DUR,KLOR-CON) 20 MEQ tablet Take 1 tablet (20 mEq total) by mouth daily. 11/14/12 11/14/13 Yes Michele Mcalpine, MD  simvastatin (ZOCOR) 40 MG tablet Take 1 tablet (40 mg total) by mouth at bedtime. 10/16/11  Yes Michele Mcalpine, MD  oxyCODONE (OXY IR/ROXICODONE) 5 MG immediate release tablet Take 1-2 tablets (5-10 mg total) by mouth every 4 (four) hours as needed for pain. 11/16/12   Romeo Zielinski Tamala Ser, PA-C  polyethylene glycol (MIRALAX / GLYCOLAX) packet Take 17 g by mouth daily as needed. 11/16/12   Diarra Ceja Tamala Ser, PA-C    Diet: Cardiac diet Activity:NWB Follow-up:in 2 weeks Disposition - Skilled nursing facility Discharged Condition: fair   Discharge Orders   Future Appointments Provider Department Dept Phone   03/10/2013 11:00 AM Michele Mcalpine, MD [facility] [phone]   Future Orders Complete By Expires     Call MD / Call 911  As  directed     Comments:      If you experience chest pain or shortness of breath, CALL 911 and be transported to the hospital emergency room.  If you develope a fever above 101 F, pus (white drainage) or increased drainage or redness at the wound, or calf pain, call your surgeon's office.    Constipation Prevention  As directed     Comments:      Drink plenty of fluids.  Prune juice may be helpful.  You may use a stool softener, such as Colace (over the counter) 100 mg twice a day.  Use MiraLax (over the counter) for constipation as needed.    Diet - low sodium heart healthy  As directed     Discharge instructions  As directed     Comments:      Walk with your walker. Weight bearing as instructed.. Call if any temperatures greater than 101 or any wound complications: [phone] during the day and ask for Dr. Jeannetta Ellis nurse, Babette Relic  Johnson.    Increase activity slowly as tolerated  As directed         Medication List    TAKE these medications       ALPRAZolam 0.5 MG tablet  Commonly known as:  XANAX  Take 1/2 to 1 tablet by mouth three times daily as needed for nerves     aspirin 81 MG tablet  Take 81 mg by mouth daily.     CENTRUM SILVER PO  Take 1 tablet by mouth daily.     clopidogrel 75 MG tablet  Commonly known as:  PLAVIX  Take 1 tablet (75 mg total) by mouth daily.     donepezil 10 MG tablet  Commonly known as:  ARICEPT  Take 1 tablet (10 mg total) by mouth at bedtime as needed.     fish oil-omega-3 fatty acids 1000 MG capsule  Take 1 g by mouth daily.     furosemide 40 MG tablet  Commonly known as:  LASIX  Take 1 tablet (40 mg total) by mouth daily. 1/2 tablet by mouth daily     isosorbide mononitrate 30 MG 24 hr tablet  Commonly known as:  IMDUR  Take 1/2 tablet by mouth once daily     levothyroxine 75 MCG tablet  Commonly known as:  SYNTHROID, LEVOTHROID  Take 1 tablet (75 mcg total) by mouth daily.     losartan 100 MG tablet  Commonly known as:  COZAAR    Take 1 tablet (100 mg total) by mouth daily.     memantine 10 MG tablet  Commonly known as:  NAMENDA  Take 1 tablet (10 mg total) by mouth 2 (two) times daily.     metoprolol succinate 100 MG 24 hr tablet  Commonly known as:  TOPROL-XL  Take 100 mg by mouth at bedtime. Take 1/2 tablet by mouth once daily     oxyCODONE 5 MG immediate release tablet  Commonly known as:  Oxy IR/ROXICODONE  Take 1-2 tablets (5-10 mg total) by mouth every 4 (four) hours as needed for pain.     polyethylene glycol packet  Commonly known as:  MIRALAX / GLYCOLAX  Take 17 g by mouth daily as needed.     potassium chloride SA 20 MEQ tablet  Commonly known as:  K-DUR,KLOR-CON  Take 1 tablet (20 mEq total) by mouth daily.     simvastatin 40 MG tablet  Commonly known as:  ZOCOR  Take 1 tablet (40 mg total) by mouth at bedtime.     Vitamin D 2000 UNITS Caps  Take 1 capsule by mouth daily.           Follow-up Information   Follow up with GIOFFRE,RONALD A, MD. Schedule an appointment as soon as possible for a visit in 1 week.   Contact information:   [address], [place] [address] [place] [address] [phone]       Signed: Kerby Nora 11/16/2012, 11:16 PM

## 2012-11-17 NOTE — Progress Notes (Signed)
Clinical Social Work Department CLINICAL SOCIAL WORK PLACEMENT NOTE 11/17/2012  Patient:  [name], [name]  Account Number:  [id number] Admit date:  11/14/2012  Clinical Social Worker:  Doroteo Glassman  Date/time:  11/15/2012 12:04 PM  Clinical Social Work is seeking post-discharge placement for this patient at the following level of care:   SKILLED NURSING   (*CSW will update this form in Epic as items are completed)   11/15/2012  Patient/family provided with [facility] Department of Clinical Social Work's list of facilities offering this level of care within the geographic area requested by the patient (or if unable, by the patient's family).  11/15/2012  Patient/family informed of their freedom to choose among providers that offer the needed level of care, that participate in Medicare, Medicaid or managed care program needed by the patient, have an available bed and are willing to accept the patient.  11/15/2012  Patient/family informed of [facility]' ownership interest in [facility], as well as of the fact that they are under no obligation to receive care at this facility.  PASARR submitted to EDS on 11/15/2012 PASARR number received from EDS on 11/15/2012  FL2 transmitted to all facilities in geographic area requested by pt/family on  11/15/2012 FL2 transmitted to all facilities within larger geographic area on   Patient informed that his/her managed care company has contracts with or will negotiate with  certain facilities, including the following:     Patient/family informed of bed offers received:  11/17/2012 Patient chooses bed at [facility], [place] Physician recommends and patient chooses bed at    [facility] [place] on  11/17/2012 Patient to be transferred to facility by [facility]  The following physician request were entered in Epic:   Additional Comments: BLUE MEDICARE provided  authorization for SNF . Authorization was not required for amb transport according to CM at [facility] ( [facility] ).  Cori Razor LCSW [phone]

## 2012-11-17 NOTE — Progress Notes (Signed)
Subjective:     Patient reports pain as 2 on 0-10 scale.  Pain is much less today. We are gradually getting her up. Will follow in office in 2-weeks.  Objective: Vital signs in last 24 hours: Temp:  [98 F (36.7 C)-98.8 F (37.1 C)] 98.8 F (37.1 C) (04/14 0433) Pulse Rate:  [61-97] 67 (04/14 0433) Resp:  [16-18] 16 (04/14 0433) BP: (153-171)/(67-88) 164/85 mmHg (04/14 0433) SpO2:  [93 %-95 %] 94 % (04/14 0433)  Intake/Output from previous day: 04/13 0701 - 04/14 0700 In: 2235 [P.O.:840; I.V.:1395] Out: 3850 [Urine:3850] Intake/Output this shift:     Recent Labs  11/15/12 0422  HGB 12.3    Recent Labs  11/15/12 0422  WBC 7.3  RBC 3.77*  HCT 36.4  PLT 185    Recent Labs  11/15/12 0422  NA 140  K 3.7  CL 106  CO2 26  BUN 22  CREATININE 1.00  GLUCOSE 122*  CALCIUM 9.4   No results found for this basename: LABPT, INR,  in the last 72 hours  Neurologically intact  Assessment/Plan:     Discharge to SNF  Andrea Mccoy A 11/17/2012, 7:13 AM

## 2012-11-17 NOTE — Progress Notes (Signed)
Report called to Amy RN at [facility] [place].. DFranklin RN

## 2012-11-18 ENCOUNTER — Telehealth: Payer: Self-pay | Admitting: Pulmonary Disease

## 2012-11-18 NOTE — Telephone Encounter (Signed)
Pt was d/c from hospital to [facility] nursing home s/p hip fx.  Daughter reports that facility is only giving pt Xanax as needed and due to pt's Alzheimer's she does not remember to ask for it.  Facility said that any change in order would need to come from primary md.  She requests we call [facility] and change order back to Xanax 0.5 one tablet in am, 1/2 tablet at lunch and one tablet in pm.  Please advise

## 2012-11-18 NOTE — Telephone Encounter (Signed)
Spoke with daughter an is aware of dose change Called [facility] at [phone]. Unable to speak to any one. Left a message for facility to call back to advise of change.

## 2012-11-18 NOTE — Telephone Encounter (Signed)
Per SN----  Ok to call [facility] and have the order for the alprazolam 0.5 mg  1 tab in the am, 1/2 tab at lunch, 1 tablet in the evening.  thanks

## 2012-11-19 NOTE — Telephone Encounter (Signed)
Called [facility] in [place] at [phone].   Spoke with pt's nurse, Joice Lofts. Gave VO to change alprazolam to alprazolam 0.5 mg 1 tab in the am, 1/2 tab at lunch, 1 tablet in the evening. Drinda Butts verbalized understanding of this and stated nothing further was needed.   Will sign off.

## 2013-03-10 ENCOUNTER — Ambulatory Visit: Payer: Medicare Other | Admitting: Pulmonary Disease

## 2013-04-14 DIAGNOSIS — E039 Hypothyroidism, unspecified: Secondary | ICD-10-CM | POA: Diagnosis not present

## 2013-04-14 DIAGNOSIS — R279 Unspecified lack of coordination: Secondary | ICD-10-CM | POA: Diagnosis not present

## 2013-04-14 DIAGNOSIS — F0280 Dementia in other diseases classified elsewhere without behavioral disturbance: Secondary | ICD-10-CM | POA: Diagnosis not present

## 2013-05-04 DIAGNOSIS — Z79899 Other long term (current) drug therapy: Secondary | ICD-10-CM | POA: Diagnosis not present

## 2013-06-14 DIAGNOSIS — R609 Edema, unspecified: Secondary | ICD-10-CM | POA: Diagnosis not present

## 2013-06-14 DIAGNOSIS — F0280 Dementia in other diseases classified elsewhere without behavioral disturbance: Secondary | ICD-10-CM | POA: Diagnosis not present

## 2013-06-14 DIAGNOSIS — R197 Diarrhea, unspecified: Secondary | ICD-10-CM | POA: Diagnosis not present

## 2013-06-14 DIAGNOSIS — E039 Hypothyroidism, unspecified: Secondary | ICD-10-CM | POA: Diagnosis not present

## 2013-07-14 DIAGNOSIS — R262 Difficulty in walking, not elsewhere classified: Secondary | ICD-10-CM | POA: Diagnosis not present

## 2013-07-15 DIAGNOSIS — R262 Difficulty in walking, not elsewhere classified: Secondary | ICD-10-CM | POA: Diagnosis not present

## 2013-07-16 DIAGNOSIS — D649 Anemia, unspecified: Secondary | ICD-10-CM | POA: Diagnosis not present

## 2013-07-16 DIAGNOSIS — R262 Difficulty in walking, not elsewhere classified: Secondary | ICD-10-CM | POA: Diagnosis not present

## 2013-07-16 DIAGNOSIS — R0609 Other forms of dyspnea: Secondary | ICD-10-CM | POA: Diagnosis not present

## 2013-07-16 DIAGNOSIS — E039 Hypothyroidism, unspecified: Secondary | ICD-10-CM | POA: Diagnosis not present

## 2013-07-16 DIAGNOSIS — Z79899 Other long term (current) drug therapy: Secondary | ICD-10-CM | POA: Diagnosis not present

## 2013-07-16 DIAGNOSIS — R0989 Other specified symptoms and signs involving the circulatory and respiratory systems: Secondary | ICD-10-CM | POA: Diagnosis not present

## 2013-07-17 DIAGNOSIS — N390 Urinary tract infection, site not specified: Secondary | ICD-10-CM | POA: Diagnosis not present

## 2013-07-17 DIAGNOSIS — R262 Difficulty in walking, not elsewhere classified: Secondary | ICD-10-CM | POA: Diagnosis not present

## 2013-07-18 DIAGNOSIS — R609 Edema, unspecified: Secondary | ICD-10-CM | POA: Diagnosis not present

## 2013-07-18 DIAGNOSIS — F0280 Dementia in other diseases classified elsewhere without behavioral disturbance: Secondary | ICD-10-CM | POA: Diagnosis not present

## 2013-07-18 DIAGNOSIS — R197 Diarrhea, unspecified: Secondary | ICD-10-CM | POA: Diagnosis not present

## 2013-07-18 DIAGNOSIS — E039 Hypothyroidism, unspecified: Secondary | ICD-10-CM | POA: Diagnosis not present

## 2013-07-18 DIAGNOSIS — R279 Unspecified lack of coordination: Secondary | ICD-10-CM | POA: Diagnosis not present

## 2013-07-18 DIAGNOSIS — N189 Chronic kidney disease, unspecified: Secondary | ICD-10-CM | POA: Diagnosis not present

## 2013-07-20 DIAGNOSIS — R262 Difficulty in walking, not elsewhere classified: Secondary | ICD-10-CM | POA: Diagnosis not present

## 2013-07-21 DIAGNOSIS — R262 Difficulty in walking, not elsewhere classified: Secondary | ICD-10-CM | POA: Diagnosis not present

## 2013-07-22 DIAGNOSIS — R262 Difficulty in walking, not elsewhere classified: Secondary | ICD-10-CM | POA: Diagnosis not present

## 2013-07-23 DIAGNOSIS — R262 Difficulty in walking, not elsewhere classified: Secondary | ICD-10-CM | POA: Diagnosis not present

## 2013-07-24 DIAGNOSIS — R262 Difficulty in walking, not elsewhere classified: Secondary | ICD-10-CM | POA: Diagnosis not present

## 2013-07-26 DIAGNOSIS — R262 Difficulty in walking, not elsewhere classified: Secondary | ICD-10-CM | POA: Diagnosis not present

## 2013-07-27 DIAGNOSIS — R262 Difficulty in walking, not elsewhere classified: Secondary | ICD-10-CM | POA: Diagnosis not present

## 2013-07-28 DIAGNOSIS — R262 Difficulty in walking, not elsewhere classified: Secondary | ICD-10-CM | POA: Diagnosis not present

## 2013-07-29 DIAGNOSIS — R262 Difficulty in walking, not elsewhere classified: Secondary | ICD-10-CM | POA: Diagnosis not present

## 2013-07-31 DIAGNOSIS — R262 Difficulty in walking, not elsewhere classified: Secondary | ICD-10-CM | POA: Diagnosis not present

## 2013-08-03 DIAGNOSIS — R262 Difficulty in walking, not elsewhere classified: Secondary | ICD-10-CM | POA: Diagnosis not present

## 2013-08-04 DIAGNOSIS — R262 Difficulty in walking, not elsewhere classified: Secondary | ICD-10-CM | POA: Diagnosis not present

## 2013-08-05 DIAGNOSIS — Z79899 Other long term (current) drug therapy: Secondary | ICD-10-CM | POA: Diagnosis not present

## 2013-08-05 DIAGNOSIS — R262 Difficulty in walking, not elsewhere classified: Secondary | ICD-10-CM | POA: Diagnosis not present

## 2013-08-06 DIAGNOSIS — D649 Anemia, unspecified: Secondary | ICD-10-CM | POA: Diagnosis not present

## 2013-08-06 DIAGNOSIS — R05 Cough: Secondary | ICD-10-CM | POA: Diagnosis not present

## 2013-08-06 DIAGNOSIS — R29818 Other symptoms and signs involving the nervous system: Secondary | ICD-10-CM | POA: Diagnosis not present

## 2013-08-06 DIAGNOSIS — R279 Unspecified lack of coordination: Secondary | ICD-10-CM | POA: Diagnosis not present

## 2013-08-06 DIAGNOSIS — R262 Difficulty in walking, not elsewhere classified: Secondary | ICD-10-CM | POA: Diagnosis not present

## 2013-08-06 DIAGNOSIS — R509 Fever, unspecified: Secondary | ICD-10-CM | POA: Diagnosis not present

## 2013-08-06 DIAGNOSIS — R059 Cough, unspecified: Secondary | ICD-10-CM | POA: Diagnosis not present

## 2013-08-09 DIAGNOSIS — R609 Edema, unspecified: Secondary | ICD-10-CM | POA: Diagnosis not present

## 2013-08-09 DIAGNOSIS — R509 Fever, unspecified: Secondary | ICD-10-CM | POA: Diagnosis not present

## 2013-08-09 DIAGNOSIS — R05 Cough: Secondary | ICD-10-CM | POA: Diagnosis not present

## 2013-08-09 DIAGNOSIS — R059 Cough, unspecified: Secondary | ICD-10-CM | POA: Diagnosis not present

## 2013-08-10 DIAGNOSIS — R279 Unspecified lack of coordination: Secondary | ICD-10-CM | POA: Diagnosis not present

## 2013-08-10 DIAGNOSIS — R262 Difficulty in walking, not elsewhere classified: Secondary | ICD-10-CM | POA: Diagnosis not present

## 2013-08-10 DIAGNOSIS — R29818 Other symptoms and signs involving the nervous system: Secondary | ICD-10-CM | POA: Diagnosis not present

## 2013-08-11 DIAGNOSIS — R279 Unspecified lack of coordination: Secondary | ICD-10-CM | POA: Diagnosis not present

## 2013-08-11 DIAGNOSIS — R262 Difficulty in walking, not elsewhere classified: Secondary | ICD-10-CM | POA: Diagnosis not present

## 2013-08-11 DIAGNOSIS — R29818 Other symptoms and signs involving the nervous system: Secondary | ICD-10-CM | POA: Diagnosis not present

## 2013-08-12 DIAGNOSIS — R262 Difficulty in walking, not elsewhere classified: Secondary | ICD-10-CM | POA: Diagnosis not present

## 2013-08-12 DIAGNOSIS — R29818 Other symptoms and signs involving the nervous system: Secondary | ICD-10-CM | POA: Diagnosis not present

## 2013-08-12 DIAGNOSIS — R279 Unspecified lack of coordination: Secondary | ICD-10-CM | POA: Diagnosis not present

## 2013-08-17 DIAGNOSIS — R262 Difficulty in walking, not elsewhere classified: Secondary | ICD-10-CM | POA: Diagnosis not present

## 2013-08-17 DIAGNOSIS — Z79899 Other long term (current) drug therapy: Secondary | ICD-10-CM | POA: Diagnosis not present

## 2013-08-17 DIAGNOSIS — R279 Unspecified lack of coordination: Secondary | ICD-10-CM | POA: Diagnosis not present

## 2013-08-17 DIAGNOSIS — R29818 Other symptoms and signs involving the nervous system: Secondary | ICD-10-CM | POA: Diagnosis not present

## 2013-08-18 DIAGNOSIS — R262 Difficulty in walking, not elsewhere classified: Secondary | ICD-10-CM | POA: Diagnosis not present

## 2013-08-18 DIAGNOSIS — R29818 Other symptoms and signs involving the nervous system: Secondary | ICD-10-CM | POA: Diagnosis not present

## 2013-08-18 DIAGNOSIS — R279 Unspecified lack of coordination: Secondary | ICD-10-CM | POA: Diagnosis not present

## 2013-08-19 DIAGNOSIS — R279 Unspecified lack of coordination: Secondary | ICD-10-CM | POA: Diagnosis not present

## 2013-08-19 DIAGNOSIS — R262 Difficulty in walking, not elsewhere classified: Secondary | ICD-10-CM | POA: Diagnosis not present

## 2013-08-19 DIAGNOSIS — R29818 Other symptoms and signs involving the nervous system: Secondary | ICD-10-CM | POA: Diagnosis not present

## 2013-08-20 DIAGNOSIS — R29818 Other symptoms and signs involving the nervous system: Secondary | ICD-10-CM | POA: Diagnosis not present

## 2013-08-20 DIAGNOSIS — R262 Difficulty in walking, not elsewhere classified: Secondary | ICD-10-CM | POA: Diagnosis not present

## 2013-08-20 DIAGNOSIS — R279 Unspecified lack of coordination: Secondary | ICD-10-CM | POA: Diagnosis not present

## 2013-08-22 DIAGNOSIS — R29818 Other symptoms and signs involving the nervous system: Secondary | ICD-10-CM | POA: Diagnosis not present

## 2013-08-22 DIAGNOSIS — R262 Difficulty in walking, not elsewhere classified: Secondary | ICD-10-CM | POA: Diagnosis not present

## 2013-08-22 DIAGNOSIS — R279 Unspecified lack of coordination: Secondary | ICD-10-CM | POA: Diagnosis not present

## 2013-08-24 DIAGNOSIS — R279 Unspecified lack of coordination: Secondary | ICD-10-CM | POA: Diagnosis not present

## 2013-08-24 DIAGNOSIS — R29818 Other symptoms and signs involving the nervous system: Secondary | ICD-10-CM | POA: Diagnosis not present

## 2013-08-24 DIAGNOSIS — R262 Difficulty in walking, not elsewhere classified: Secondary | ICD-10-CM | POA: Diagnosis not present

## 2013-08-25 DIAGNOSIS — R279 Unspecified lack of coordination: Secondary | ICD-10-CM | POA: Diagnosis not present

## 2013-08-25 DIAGNOSIS — R262 Difficulty in walking, not elsewhere classified: Secondary | ICD-10-CM | POA: Diagnosis not present

## 2013-08-25 DIAGNOSIS — R29818 Other symptoms and signs involving the nervous system: Secondary | ICD-10-CM | POA: Diagnosis not present

## 2013-08-26 DIAGNOSIS — E559 Vitamin D deficiency, unspecified: Secondary | ICD-10-CM | POA: Diagnosis not present

## 2013-08-26 DIAGNOSIS — R279 Unspecified lack of coordination: Secondary | ICD-10-CM | POA: Diagnosis not present

## 2013-08-26 DIAGNOSIS — R262 Difficulty in walking, not elsewhere classified: Secondary | ICD-10-CM | POA: Diagnosis not present

## 2013-08-26 DIAGNOSIS — E785 Hyperlipidemia, unspecified: Secondary | ICD-10-CM | POA: Diagnosis not present

## 2013-08-26 DIAGNOSIS — R29818 Other symptoms and signs involving the nervous system: Secondary | ICD-10-CM | POA: Diagnosis not present

## 2013-08-27 DIAGNOSIS — R262 Difficulty in walking, not elsewhere classified: Secondary | ICD-10-CM | POA: Diagnosis not present

## 2013-08-27 DIAGNOSIS — R29818 Other symptoms and signs involving the nervous system: Secondary | ICD-10-CM | POA: Diagnosis not present

## 2013-08-27 DIAGNOSIS — R279 Unspecified lack of coordination: Secondary | ICD-10-CM | POA: Diagnosis not present

## 2013-08-28 DIAGNOSIS — R262 Difficulty in walking, not elsewhere classified: Secondary | ICD-10-CM | POA: Diagnosis not present

## 2013-08-28 DIAGNOSIS — R29818 Other symptoms and signs involving the nervous system: Secondary | ICD-10-CM | POA: Diagnosis not present

## 2013-08-28 DIAGNOSIS — R279 Unspecified lack of coordination: Secondary | ICD-10-CM | POA: Diagnosis not present

## 2013-08-31 DIAGNOSIS — R262 Difficulty in walking, not elsewhere classified: Secondary | ICD-10-CM | POA: Diagnosis not present

## 2013-08-31 DIAGNOSIS — R29818 Other symptoms and signs involving the nervous system: Secondary | ICD-10-CM | POA: Diagnosis not present

## 2013-08-31 DIAGNOSIS — R279 Unspecified lack of coordination: Secondary | ICD-10-CM | POA: Diagnosis not present

## 2013-09-01 DIAGNOSIS — R29818 Other symptoms and signs involving the nervous system: Secondary | ICD-10-CM | POA: Diagnosis not present

## 2013-09-01 DIAGNOSIS — R262 Difficulty in walking, not elsewhere classified: Secondary | ICD-10-CM | POA: Diagnosis not present

## 2013-09-01 DIAGNOSIS — R279 Unspecified lack of coordination: Secondary | ICD-10-CM | POA: Diagnosis not present

## 2013-09-02 DIAGNOSIS — R29818 Other symptoms and signs involving the nervous system: Secondary | ICD-10-CM | POA: Diagnosis not present

## 2013-09-02 DIAGNOSIS — R279 Unspecified lack of coordination: Secondary | ICD-10-CM | POA: Diagnosis not present

## 2013-09-02 DIAGNOSIS — R262 Difficulty in walking, not elsewhere classified: Secondary | ICD-10-CM | POA: Diagnosis not present

## 2013-09-03 DIAGNOSIS — R29818 Other symptoms and signs involving the nervous system: Secondary | ICD-10-CM | POA: Diagnosis not present

## 2013-09-03 DIAGNOSIS — R262 Difficulty in walking, not elsewhere classified: Secondary | ICD-10-CM | POA: Diagnosis not present

## 2013-09-03 DIAGNOSIS — R279 Unspecified lack of coordination: Secondary | ICD-10-CM | POA: Diagnosis not present

## 2013-09-22 DIAGNOSIS — I10 Essential (primary) hypertension: Secondary | ICD-10-CM | POA: Diagnosis not present

## 2013-09-22 DIAGNOSIS — R059 Cough, unspecified: Secondary | ICD-10-CM | POA: Diagnosis not present

## 2013-09-22 DIAGNOSIS — R05 Cough: Secondary | ICD-10-CM | POA: Diagnosis not present

## 2013-09-22 DIAGNOSIS — R609 Edema, unspecified: Secondary | ICD-10-CM | POA: Diagnosis not present

## 2013-10-13 DIAGNOSIS — N390 Urinary tract infection, site not specified: Secondary | ICD-10-CM | POA: Diagnosis not present

## 2013-10-13 DIAGNOSIS — Z79899 Other long term (current) drug therapy: Secondary | ICD-10-CM | POA: Diagnosis not present

## 2013-11-03 DIAGNOSIS — R1311 Dysphagia, oral phase: Secondary | ICD-10-CM | POA: Diagnosis not present

## 2013-11-04 DIAGNOSIS — R1311 Dysphagia, oral phase: Secondary | ICD-10-CM | POA: Diagnosis not present

## 2013-11-05 DIAGNOSIS — R1311 Dysphagia, oral phase: Secondary | ICD-10-CM | POA: Diagnosis not present

## 2013-11-06 DIAGNOSIS — R1311 Dysphagia, oral phase: Secondary | ICD-10-CM | POA: Diagnosis not present

## 2013-11-09 DIAGNOSIS — R1311 Dysphagia, oral phase: Secondary | ICD-10-CM | POA: Diagnosis not present

## 2013-11-10 DIAGNOSIS — R1311 Dysphagia, oral phase: Secondary | ICD-10-CM | POA: Diagnosis not present

## 2013-11-11 DIAGNOSIS — R1311 Dysphagia, oral phase: Secondary | ICD-10-CM | POA: Diagnosis not present

## 2014-01-06 DIAGNOSIS — R609 Edema, unspecified: Secondary | ICD-10-CM | POA: Diagnosis not present

## 2014-01-06 DIAGNOSIS — F0280 Dementia in other diseases classified elsewhere without behavioral disturbance: Secondary | ICD-10-CM | POA: Diagnosis not present

## 2014-01-06 DIAGNOSIS — M79609 Pain in unspecified limb: Secondary | ICD-10-CM | POA: Diagnosis not present

## 2014-01-06 DIAGNOSIS — G309 Alzheimer's disease, unspecified: Secondary | ICD-10-CM | POA: Diagnosis not present

## 2014-01-06 DIAGNOSIS — B351 Tinea unguium: Secondary | ICD-10-CM | POA: Diagnosis not present

## 2014-01-14 DIAGNOSIS — Z79899 Other long term (current) drug therapy: Secondary | ICD-10-CM | POA: Diagnosis not present

## 2014-01-14 DIAGNOSIS — N390 Urinary tract infection, site not specified: Secondary | ICD-10-CM | POA: Diagnosis not present

## 2014-01-14 DIAGNOSIS — R0602 Shortness of breath: Secondary | ICD-10-CM | POA: Diagnosis not present

## 2014-01-14 DIAGNOSIS — D649 Anemia, unspecified: Secondary | ICD-10-CM | POA: Diagnosis not present

## 2014-01-17 DIAGNOSIS — R609 Edema, unspecified: Secondary | ICD-10-CM | POA: Diagnosis not present

## 2014-01-17 DIAGNOSIS — R4182 Altered mental status, unspecified: Secondary | ICD-10-CM | POA: Diagnosis not present

## 2014-01-17 DIAGNOSIS — I10 Essential (primary) hypertension: Secondary | ICD-10-CM | POA: Diagnosis not present

## 2014-01-17 DIAGNOSIS — R82998 Other abnormal findings in urine: Secondary | ICD-10-CM | POA: Diagnosis not present

## 2014-01-19 DIAGNOSIS — R293 Abnormal posture: Secondary | ICD-10-CM | POA: Diagnosis not present

## 2014-01-19 DIAGNOSIS — Z4689 Encounter for fitting and adjustment of other specified devices: Secondary | ICD-10-CM | POA: Diagnosis not present

## 2014-01-20 DIAGNOSIS — Z4689 Encounter for fitting and adjustment of other specified devices: Secondary | ICD-10-CM | POA: Diagnosis not present

## 2014-01-20 DIAGNOSIS — R293 Abnormal posture: Secondary | ICD-10-CM | POA: Diagnosis not present

## 2014-01-21 DIAGNOSIS — Z4689 Encounter for fitting and adjustment of other specified devices: Secondary | ICD-10-CM | POA: Diagnosis not present

## 2014-01-21 DIAGNOSIS — R293 Abnormal posture: Secondary | ICD-10-CM | POA: Diagnosis not present

## 2014-01-22 DIAGNOSIS — Z4689 Encounter for fitting and adjustment of other specified devices: Secondary | ICD-10-CM | POA: Diagnosis not present

## 2014-01-22 DIAGNOSIS — R293 Abnormal posture: Secondary | ICD-10-CM | POA: Diagnosis not present

## 2014-01-23 DIAGNOSIS — R293 Abnormal posture: Secondary | ICD-10-CM | POA: Diagnosis not present

## 2014-01-23 DIAGNOSIS — Z4689 Encounter for fitting and adjustment of other specified devices: Secondary | ICD-10-CM | POA: Diagnosis not present

## 2014-01-24 DIAGNOSIS — E785 Hyperlipidemia, unspecified: Secondary | ICD-10-CM | POA: Diagnosis not present

## 2014-01-24 DIAGNOSIS — E538 Deficiency of other specified B group vitamins: Secondary | ICD-10-CM | POA: Diagnosis not present

## 2014-01-24 DIAGNOSIS — Z79899 Other long term (current) drug therapy: Secondary | ICD-10-CM | POA: Diagnosis not present

## 2014-01-24 DIAGNOSIS — E039 Hypothyroidism, unspecified: Secondary | ICD-10-CM | POA: Diagnosis not present

## 2014-01-25 DIAGNOSIS — R293 Abnormal posture: Secondary | ICD-10-CM | POA: Diagnosis not present

## 2014-01-25 DIAGNOSIS — Z4689 Encounter for fitting and adjustment of other specified devices: Secondary | ICD-10-CM | POA: Diagnosis not present

## 2014-01-26 DIAGNOSIS — R293 Abnormal posture: Secondary | ICD-10-CM | POA: Diagnosis not present

## 2014-01-26 DIAGNOSIS — Z4689 Encounter for fitting and adjustment of other specified devices: Secondary | ICD-10-CM | POA: Diagnosis not present

## 2014-01-27 DIAGNOSIS — Z4689 Encounter for fitting and adjustment of other specified devices: Secondary | ICD-10-CM | POA: Diagnosis not present

## 2014-01-27 DIAGNOSIS — R293 Abnormal posture: Secondary | ICD-10-CM | POA: Diagnosis not present

## 2014-01-28 DIAGNOSIS — R293 Abnormal posture: Secondary | ICD-10-CM | POA: Diagnosis not present

## 2014-01-28 DIAGNOSIS — Z4689 Encounter for fitting and adjustment of other specified devices: Secondary | ICD-10-CM | POA: Diagnosis not present

## 2014-01-29 DIAGNOSIS — Z4689 Encounter for fitting and adjustment of other specified devices: Secondary | ICD-10-CM | POA: Diagnosis not present

## 2014-01-29 DIAGNOSIS — R293 Abnormal posture: Secondary | ICD-10-CM | POA: Diagnosis not present

## 2014-01-31 DIAGNOSIS — R293 Abnormal posture: Secondary | ICD-10-CM | POA: Diagnosis not present

## 2014-01-31 DIAGNOSIS — Z4689 Encounter for fitting and adjustment of other specified devices: Secondary | ICD-10-CM | POA: Diagnosis not present

## 2014-02-01 DIAGNOSIS — Z4689 Encounter for fitting and adjustment of other specified devices: Secondary | ICD-10-CM | POA: Diagnosis not present

## 2014-02-01 DIAGNOSIS — R293 Abnormal posture: Secondary | ICD-10-CM | POA: Diagnosis not present

## 2014-02-02 DIAGNOSIS — R293 Abnormal posture: Secondary | ICD-10-CM | POA: Diagnosis not present

## 2014-02-02 DIAGNOSIS — Z4689 Encounter for fitting and adjustment of other specified devices: Secondary | ICD-10-CM | POA: Diagnosis not present

## 2014-03-23 DIAGNOSIS — R293 Abnormal posture: Secondary | ICD-10-CM | POA: Diagnosis not present

## 2014-03-23 DIAGNOSIS — M6281 Muscle weakness (generalized): Secondary | ICD-10-CM | POA: Diagnosis not present

## 2014-03-23 DIAGNOSIS — Z4689 Encounter for fitting and adjustment of other specified devices: Secondary | ICD-10-CM | POA: Diagnosis not present

## 2014-03-24 DIAGNOSIS — R293 Abnormal posture: Secondary | ICD-10-CM | POA: Diagnosis not present

## 2014-03-24 DIAGNOSIS — Z4689 Encounter for fitting and adjustment of other specified devices: Secondary | ICD-10-CM | POA: Diagnosis not present

## 2014-03-24 DIAGNOSIS — M6281 Muscle weakness (generalized): Secondary | ICD-10-CM | POA: Diagnosis not present

## 2014-03-25 DIAGNOSIS — Z4689 Encounter for fitting and adjustment of other specified devices: Secondary | ICD-10-CM | POA: Diagnosis not present

## 2014-03-25 DIAGNOSIS — M6281 Muscle weakness (generalized): Secondary | ICD-10-CM | POA: Diagnosis not present

## 2014-03-25 DIAGNOSIS — R293 Abnormal posture: Secondary | ICD-10-CM | POA: Diagnosis not present

## 2014-03-26 DIAGNOSIS — R293 Abnormal posture: Secondary | ICD-10-CM | POA: Diagnosis not present

## 2014-03-26 DIAGNOSIS — Z4689 Encounter for fitting and adjustment of other specified devices: Secondary | ICD-10-CM | POA: Diagnosis not present

## 2014-03-26 DIAGNOSIS — M6281 Muscle weakness (generalized): Secondary | ICD-10-CM | POA: Diagnosis not present

## 2014-04-03 DIAGNOSIS — R293 Abnormal posture: Secondary | ICD-10-CM | POA: Diagnosis not present

## 2014-04-03 DIAGNOSIS — M6281 Muscle weakness (generalized): Secondary | ICD-10-CM | POA: Diagnosis not present

## 2014-04-03 DIAGNOSIS — Z4689 Encounter for fitting and adjustment of other specified devices: Secondary | ICD-10-CM | POA: Diagnosis not present

## 2014-04-05 DIAGNOSIS — R293 Abnormal posture: Secondary | ICD-10-CM | POA: Diagnosis not present

## 2014-04-05 DIAGNOSIS — Z4689 Encounter for fitting and adjustment of other specified devices: Secondary | ICD-10-CM | POA: Diagnosis not present

## 2014-04-05 DIAGNOSIS — M6281 Muscle weakness (generalized): Secondary | ICD-10-CM | POA: Diagnosis not present

## 2014-04-06 DIAGNOSIS — R293 Abnormal posture: Secondary | ICD-10-CM | POA: Diagnosis not present

## 2014-04-06 DIAGNOSIS — Z4689 Encounter for fitting and adjustment of other specified devices: Secondary | ICD-10-CM | POA: Diagnosis not present

## 2014-04-07 DIAGNOSIS — Z4689 Encounter for fitting and adjustment of other specified devices: Secondary | ICD-10-CM | POA: Diagnosis not present

## 2014-04-07 DIAGNOSIS — R293 Abnormal posture: Secondary | ICD-10-CM | POA: Diagnosis not present

## 2014-04-14 DIAGNOSIS — R262 Difficulty in walking, not elsewhere classified: Secondary | ICD-10-CM | POA: Diagnosis not present

## 2014-04-14 DIAGNOSIS — M79609 Pain in unspecified limb: Secondary | ICD-10-CM | POA: Diagnosis not present

## 2014-04-14 DIAGNOSIS — I739 Peripheral vascular disease, unspecified: Secondary | ICD-10-CM | POA: Diagnosis not present

## 2014-04-14 DIAGNOSIS — B351 Tinea unguium: Secondary | ICD-10-CM | POA: Diagnosis not present

## 2014-04-15 DIAGNOSIS — Z79899 Other long term (current) drug therapy: Secondary | ICD-10-CM | POA: Diagnosis not present

## 2014-04-18 DIAGNOSIS — I10 Essential (primary) hypertension: Secondary | ICD-10-CM | POA: Diagnosis not present

## 2014-04-18 DIAGNOSIS — R609 Edema, unspecified: Secondary | ICD-10-CM | POA: Diagnosis not present

## 2014-04-18 DIAGNOSIS — I2510 Atherosclerotic heart disease of native coronary artery without angina pectoris: Secondary | ICD-10-CM | POA: Diagnosis not present

## 2014-04-19 DIAGNOSIS — Z79899 Other long term (current) drug therapy: Secondary | ICD-10-CM | POA: Diagnosis not present

## 2014-04-19 DIAGNOSIS — R0609 Other forms of dyspnea: Secondary | ICD-10-CM | POA: Diagnosis not present

## 2014-05-05 DIAGNOSIS — R0609 Other forms of dyspnea: Secondary | ICD-10-CM | POA: Diagnosis not present

## 2014-05-05 DIAGNOSIS — Z79899 Other long term (current) drug therapy: Secondary | ICD-10-CM | POA: Diagnosis not present

## 2014-05-06 DIAGNOSIS — R2689 Other abnormalities of gait and mobility: Secondary | ICD-10-CM | POA: Diagnosis not present

## 2014-05-06 DIAGNOSIS — M6281 Muscle weakness (generalized): Secondary | ICD-10-CM | POA: Diagnosis not present

## 2014-05-06 DIAGNOSIS — R278 Other lack of coordination: Secondary | ICD-10-CM | POA: Diagnosis not present

## 2014-05-07 DIAGNOSIS — M6281 Muscle weakness (generalized): Secondary | ICD-10-CM | POA: Diagnosis not present

## 2014-05-07 DIAGNOSIS — R2689 Other abnormalities of gait and mobility: Secondary | ICD-10-CM | POA: Diagnosis not present

## 2014-05-07 DIAGNOSIS — R278 Other lack of coordination: Secondary | ICD-10-CM | POA: Diagnosis not present

## 2014-05-09 DIAGNOSIS — I2510 Atherosclerotic heart disease of native coronary artery without angina pectoris: Secondary | ICD-10-CM | POA: Diagnosis not present

## 2014-05-09 DIAGNOSIS — I10 Essential (primary) hypertension: Secondary | ICD-10-CM | POA: Diagnosis not present

## 2014-05-09 DIAGNOSIS — M6281 Muscle weakness (generalized): Secondary | ICD-10-CM | POA: Diagnosis not present

## 2014-05-09 DIAGNOSIS — R601 Generalized edema: Secondary | ICD-10-CM | POA: Diagnosis not present

## 2014-05-09 DIAGNOSIS — R2689 Other abnormalities of gait and mobility: Secondary | ICD-10-CM | POA: Diagnosis not present

## 2014-05-09 DIAGNOSIS — R278 Other lack of coordination: Secondary | ICD-10-CM | POA: Diagnosis not present

## 2014-05-10 DIAGNOSIS — R2689 Other abnormalities of gait and mobility: Secondary | ICD-10-CM | POA: Diagnosis not present

## 2014-05-10 DIAGNOSIS — M6281 Muscle weakness (generalized): Secondary | ICD-10-CM | POA: Diagnosis not present

## 2014-05-10 DIAGNOSIS — R278 Other lack of coordination: Secondary | ICD-10-CM | POA: Diagnosis not present

## 2014-05-11 DIAGNOSIS — R2689 Other abnormalities of gait and mobility: Secondary | ICD-10-CM | POA: Diagnosis not present

## 2014-05-11 DIAGNOSIS — R278 Other lack of coordination: Secondary | ICD-10-CM | POA: Diagnosis not present

## 2014-05-11 DIAGNOSIS — M6281 Muscle weakness (generalized): Secondary | ICD-10-CM | POA: Diagnosis not present

## 2014-05-12 DIAGNOSIS — R2689 Other abnormalities of gait and mobility: Secondary | ICD-10-CM | POA: Diagnosis not present

## 2014-05-12 DIAGNOSIS — M6281 Muscle weakness (generalized): Secondary | ICD-10-CM | POA: Diagnosis not present

## 2014-05-12 DIAGNOSIS — R278 Other lack of coordination: Secondary | ICD-10-CM | POA: Diagnosis not present

## 2014-05-13 DIAGNOSIS — M6281 Muscle weakness (generalized): Secondary | ICD-10-CM | POA: Diagnosis not present

## 2014-05-13 DIAGNOSIS — R278 Other lack of coordination: Secondary | ICD-10-CM | POA: Diagnosis not present

## 2014-05-13 DIAGNOSIS — R2689 Other abnormalities of gait and mobility: Secondary | ICD-10-CM | POA: Diagnosis not present

## 2014-05-14 DIAGNOSIS — R278 Other lack of coordination: Secondary | ICD-10-CM | POA: Diagnosis not present

## 2014-05-14 DIAGNOSIS — R2689 Other abnormalities of gait and mobility: Secondary | ICD-10-CM | POA: Diagnosis not present

## 2014-05-14 DIAGNOSIS — M6281 Muscle weakness (generalized): Secondary | ICD-10-CM | POA: Diagnosis not present

## 2014-05-17 DIAGNOSIS — R278 Other lack of coordination: Secondary | ICD-10-CM | POA: Diagnosis not present

## 2014-05-17 DIAGNOSIS — M6281 Muscle weakness (generalized): Secondary | ICD-10-CM | POA: Diagnosis not present

## 2014-05-17 DIAGNOSIS — R2689 Other abnormalities of gait and mobility: Secondary | ICD-10-CM | POA: Diagnosis not present

## 2014-05-18 DIAGNOSIS — R278 Other lack of coordination: Secondary | ICD-10-CM | POA: Diagnosis not present

## 2014-05-18 DIAGNOSIS — M6281 Muscle weakness (generalized): Secondary | ICD-10-CM | POA: Diagnosis not present

## 2014-05-18 DIAGNOSIS — R2689 Other abnormalities of gait and mobility: Secondary | ICD-10-CM | POA: Diagnosis not present

## 2014-05-19 DIAGNOSIS — R278 Other lack of coordination: Secondary | ICD-10-CM | POA: Diagnosis not present

## 2014-05-19 DIAGNOSIS — R2689 Other abnormalities of gait and mobility: Secondary | ICD-10-CM | POA: Diagnosis not present

## 2014-05-19 DIAGNOSIS — M6281 Muscle weakness (generalized): Secondary | ICD-10-CM | POA: Diagnosis not present

## 2014-05-20 DIAGNOSIS — R2689 Other abnormalities of gait and mobility: Secondary | ICD-10-CM | POA: Diagnosis not present

## 2014-05-20 DIAGNOSIS — R278 Other lack of coordination: Secondary | ICD-10-CM | POA: Diagnosis not present

## 2014-05-20 DIAGNOSIS — M6281 Muscle weakness (generalized): Secondary | ICD-10-CM | POA: Diagnosis not present

## 2014-05-21 DIAGNOSIS — R2689 Other abnormalities of gait and mobility: Secondary | ICD-10-CM | POA: Diagnosis not present

## 2014-05-21 DIAGNOSIS — R278 Other lack of coordination: Secondary | ICD-10-CM | POA: Diagnosis not present

## 2014-05-21 DIAGNOSIS — M6281 Muscle weakness (generalized): Secondary | ICD-10-CM | POA: Diagnosis not present

## 2014-05-22 DIAGNOSIS — R278 Other lack of coordination: Secondary | ICD-10-CM | POA: Diagnosis not present

## 2014-05-22 DIAGNOSIS — R2689 Other abnormalities of gait and mobility: Secondary | ICD-10-CM | POA: Diagnosis not present

## 2014-05-22 DIAGNOSIS — M6281 Muscle weakness (generalized): Secondary | ICD-10-CM | POA: Diagnosis not present

## 2014-05-23 DIAGNOSIS — R2689 Other abnormalities of gait and mobility: Secondary | ICD-10-CM | POA: Diagnosis not present

## 2014-05-23 DIAGNOSIS — M6281 Muscle weakness (generalized): Secondary | ICD-10-CM | POA: Diagnosis not present

## 2014-05-23 DIAGNOSIS — R609 Edema, unspecified: Secondary | ICD-10-CM | POA: Diagnosis not present

## 2014-05-23 DIAGNOSIS — R05 Cough: Secondary | ICD-10-CM | POA: Diagnosis not present

## 2014-05-23 DIAGNOSIS — R278 Other lack of coordination: Secondary | ICD-10-CM | POA: Diagnosis not present

## 2014-05-23 DIAGNOSIS — Z79899 Other long term (current) drug therapy: Secondary | ICD-10-CM | POA: Diagnosis not present

## 2014-05-24 DIAGNOSIS — R2689 Other abnormalities of gait and mobility: Secondary | ICD-10-CM | POA: Diagnosis not present

## 2014-05-24 DIAGNOSIS — R278 Other lack of coordination: Secondary | ICD-10-CM | POA: Diagnosis not present

## 2014-05-24 DIAGNOSIS — M6281 Muscle weakness (generalized): Secondary | ICD-10-CM | POA: Diagnosis not present

## 2014-05-25 DIAGNOSIS — M6281 Muscle weakness (generalized): Secondary | ICD-10-CM | POA: Diagnosis not present

## 2014-05-25 DIAGNOSIS — R2689 Other abnormalities of gait and mobility: Secondary | ICD-10-CM | POA: Diagnosis not present

## 2014-05-25 DIAGNOSIS — R278 Other lack of coordination: Secondary | ICD-10-CM | POA: Diagnosis not present

## 2014-05-26 DIAGNOSIS — R278 Other lack of coordination: Secondary | ICD-10-CM | POA: Diagnosis not present

## 2014-05-26 DIAGNOSIS — R2689 Other abnormalities of gait and mobility: Secondary | ICD-10-CM | POA: Diagnosis not present

## 2014-05-26 DIAGNOSIS — M6281 Muscle weakness (generalized): Secondary | ICD-10-CM | POA: Diagnosis not present

## 2014-05-27 DIAGNOSIS — R278 Other lack of coordination: Secondary | ICD-10-CM | POA: Diagnosis not present

## 2014-05-27 DIAGNOSIS — M6281 Muscle weakness (generalized): Secondary | ICD-10-CM | POA: Diagnosis not present

## 2014-05-27 DIAGNOSIS — R2689 Other abnormalities of gait and mobility: Secondary | ICD-10-CM | POA: Diagnosis not present

## 2014-05-31 DIAGNOSIS — R2689 Other abnormalities of gait and mobility: Secondary | ICD-10-CM | POA: Diagnosis not present

## 2014-05-31 DIAGNOSIS — R278 Other lack of coordination: Secondary | ICD-10-CM | POA: Diagnosis not present

## 2014-05-31 DIAGNOSIS — M6281 Muscle weakness (generalized): Secondary | ICD-10-CM | POA: Diagnosis not present

## 2014-06-01 DIAGNOSIS — R278 Other lack of coordination: Secondary | ICD-10-CM | POA: Diagnosis not present

## 2014-06-01 DIAGNOSIS — M6281 Muscle weakness (generalized): Secondary | ICD-10-CM | POA: Diagnosis not present

## 2014-06-01 DIAGNOSIS — R2689 Other abnormalities of gait and mobility: Secondary | ICD-10-CM | POA: Diagnosis not present

## 2014-06-02 DIAGNOSIS — M6281 Muscle weakness (generalized): Secondary | ICD-10-CM | POA: Diagnosis not present

## 2014-06-02 DIAGNOSIS — R278 Other lack of coordination: Secondary | ICD-10-CM | POA: Diagnosis not present

## 2014-06-02 DIAGNOSIS — R2689 Other abnormalities of gait and mobility: Secondary | ICD-10-CM | POA: Diagnosis not present

## 2014-06-03 DIAGNOSIS — R2689 Other abnormalities of gait and mobility: Secondary | ICD-10-CM | POA: Diagnosis not present

## 2014-06-03 DIAGNOSIS — R278 Other lack of coordination: Secondary | ICD-10-CM | POA: Diagnosis not present

## 2014-06-03 DIAGNOSIS — M6281 Muscle weakness (generalized): Secondary | ICD-10-CM | POA: Diagnosis not present

## 2014-06-04 DIAGNOSIS — R278 Other lack of coordination: Secondary | ICD-10-CM | POA: Diagnosis not present

## 2014-06-04 DIAGNOSIS — R2689 Other abnormalities of gait and mobility: Secondary | ICD-10-CM | POA: Diagnosis not present

## 2014-06-04 DIAGNOSIS — M6281 Muscle weakness (generalized): Secondary | ICD-10-CM | POA: Diagnosis not present

## 2014-06-11 DIAGNOSIS — K6289 Other specified diseases of anus and rectum: Secondary | ICD-10-CM | POA: Diagnosis not present

## 2014-06-11 DIAGNOSIS — K649 Unspecified hemorrhoids: Secondary | ICD-10-CM | POA: Diagnosis not present

## 2014-06-27 DIAGNOSIS — E039 Hypothyroidism, unspecified: Secondary | ICD-10-CM | POA: Diagnosis not present

## 2014-06-27 DIAGNOSIS — K648 Other hemorrhoids: Secondary | ICD-10-CM | POA: Diagnosis not present

## 2014-07-14 DIAGNOSIS — M79675 Pain in left toe(s): Secondary | ICD-10-CM | POA: Diagnosis not present

## 2014-07-14 DIAGNOSIS — M79674 Pain in right toe(s): Secondary | ICD-10-CM | POA: Diagnosis not present

## 2014-07-14 DIAGNOSIS — R609 Edema, unspecified: Secondary | ICD-10-CM | POA: Diagnosis not present

## 2014-07-14 DIAGNOSIS — I739 Peripheral vascular disease, unspecified: Secondary | ICD-10-CM | POA: Diagnosis not present

## 2014-07-14 DIAGNOSIS — B351 Tinea unguium: Secondary | ICD-10-CM | POA: Diagnosis not present

## 2014-07-20 DIAGNOSIS — Z79899 Other long term (current) drug therapy: Secondary | ICD-10-CM | POA: Diagnosis not present

## 2014-07-20 DIAGNOSIS — E785 Hyperlipidemia, unspecified: Secondary | ICD-10-CM | POA: Diagnosis not present

## 2014-08-08 DIAGNOSIS — I872 Venous insufficiency (chronic) (peripheral): Secondary | ICD-10-CM | POA: Diagnosis not present

## 2014-08-08 DIAGNOSIS — R05 Cough: Secondary | ICD-10-CM | POA: Diagnosis not present

## 2014-08-08 DIAGNOSIS — I2510 Atherosclerotic heart disease of native coronary artery without angina pectoris: Secondary | ICD-10-CM | POA: Diagnosis not present

## 2014-08-20 DIAGNOSIS — R1310 Dysphagia, unspecified: Secondary | ICD-10-CM | POA: Diagnosis not present

## 2014-08-20 DIAGNOSIS — R29818 Other symptoms and signs involving the nervous system: Secondary | ICD-10-CM | POA: Diagnosis not present

## 2014-08-20 DIAGNOSIS — R29898 Other symptoms and signs involving the musculoskeletal system: Secondary | ICD-10-CM | POA: Diagnosis not present

## 2014-08-20 DIAGNOSIS — M6281 Muscle weakness (generalized): Secondary | ICD-10-CM | POA: Diagnosis not present

## 2014-08-20 DIAGNOSIS — R269 Unspecified abnormalities of gait and mobility: Secondary | ICD-10-CM | POA: Diagnosis not present

## 2014-08-23 DIAGNOSIS — M6281 Muscle weakness (generalized): Secondary | ICD-10-CM | POA: Diagnosis not present

## 2014-08-23 DIAGNOSIS — R29898 Other symptoms and signs involving the musculoskeletal system: Secondary | ICD-10-CM | POA: Diagnosis not present

## 2014-08-23 DIAGNOSIS — R269 Unspecified abnormalities of gait and mobility: Secondary | ICD-10-CM | POA: Diagnosis not present

## 2014-08-23 DIAGNOSIS — R1310 Dysphagia, unspecified: Secondary | ICD-10-CM | POA: Diagnosis not present

## 2014-08-23 DIAGNOSIS — R29818 Other symptoms and signs involving the nervous system: Secondary | ICD-10-CM | POA: Diagnosis not present

## 2014-08-24 DIAGNOSIS — R1310 Dysphagia, unspecified: Secondary | ICD-10-CM | POA: Diagnosis not present

## 2014-08-24 DIAGNOSIS — R269 Unspecified abnormalities of gait and mobility: Secondary | ICD-10-CM | POA: Diagnosis not present

## 2014-08-24 DIAGNOSIS — R29898 Other symptoms and signs involving the musculoskeletal system: Secondary | ICD-10-CM | POA: Diagnosis not present

## 2014-08-24 DIAGNOSIS — M6281 Muscle weakness (generalized): Secondary | ICD-10-CM | POA: Diagnosis not present

## 2014-08-24 DIAGNOSIS — R29818 Other symptoms and signs involving the nervous system: Secondary | ICD-10-CM | POA: Diagnosis not present

## 2014-08-25 DIAGNOSIS — R269 Unspecified abnormalities of gait and mobility: Secondary | ICD-10-CM | POA: Diagnosis not present

## 2014-08-25 DIAGNOSIS — M6281 Muscle weakness (generalized): Secondary | ICD-10-CM | POA: Diagnosis not present

## 2014-08-25 DIAGNOSIS — R1310 Dysphagia, unspecified: Secondary | ICD-10-CM | POA: Diagnosis not present

## 2014-08-25 DIAGNOSIS — R29818 Other symptoms and signs involving the nervous system: Secondary | ICD-10-CM | POA: Diagnosis not present

## 2014-08-25 DIAGNOSIS — R29898 Other symptoms and signs involving the musculoskeletal system: Secondary | ICD-10-CM | POA: Diagnosis not present

## 2014-08-26 DIAGNOSIS — R1310 Dysphagia, unspecified: Secondary | ICD-10-CM | POA: Diagnosis not present

## 2014-08-26 DIAGNOSIS — R269 Unspecified abnormalities of gait and mobility: Secondary | ICD-10-CM | POA: Diagnosis not present

## 2014-08-26 DIAGNOSIS — M6281 Muscle weakness (generalized): Secondary | ICD-10-CM | POA: Diagnosis not present

## 2014-08-26 DIAGNOSIS — R29818 Other symptoms and signs involving the nervous system: Secondary | ICD-10-CM | POA: Diagnosis not present

## 2014-08-26 DIAGNOSIS — R29898 Other symptoms and signs involving the musculoskeletal system: Secondary | ICD-10-CM | POA: Diagnosis not present

## 2014-08-27 DIAGNOSIS — M6281 Muscle weakness (generalized): Secondary | ICD-10-CM | POA: Diagnosis not present

## 2014-08-27 DIAGNOSIS — R1310 Dysphagia, unspecified: Secondary | ICD-10-CM | POA: Diagnosis not present

## 2014-08-27 DIAGNOSIS — R29898 Other symptoms and signs involving the musculoskeletal system: Secondary | ICD-10-CM | POA: Diagnosis not present

## 2014-08-27 DIAGNOSIS — R29818 Other symptoms and signs involving the nervous system: Secondary | ICD-10-CM | POA: Diagnosis not present

## 2014-08-27 DIAGNOSIS — R269 Unspecified abnormalities of gait and mobility: Secondary | ICD-10-CM | POA: Diagnosis not present

## 2014-08-29 DIAGNOSIS — R269 Unspecified abnormalities of gait and mobility: Secondary | ICD-10-CM | POA: Diagnosis not present

## 2014-08-29 DIAGNOSIS — R1310 Dysphagia, unspecified: Secondary | ICD-10-CM | POA: Diagnosis not present

## 2014-08-29 DIAGNOSIS — M6281 Muscle weakness (generalized): Secondary | ICD-10-CM | POA: Diagnosis not present

## 2014-08-29 DIAGNOSIS — R29818 Other symptoms and signs involving the nervous system: Secondary | ICD-10-CM | POA: Diagnosis not present

## 2014-08-29 DIAGNOSIS — R29898 Other symptoms and signs involving the musculoskeletal system: Secondary | ICD-10-CM | POA: Diagnosis not present

## 2014-08-30 DIAGNOSIS — M6281 Muscle weakness (generalized): Secondary | ICD-10-CM | POA: Diagnosis not present

## 2014-08-30 DIAGNOSIS — R29818 Other symptoms and signs involving the nervous system: Secondary | ICD-10-CM | POA: Diagnosis not present

## 2014-08-30 DIAGNOSIS — R269 Unspecified abnormalities of gait and mobility: Secondary | ICD-10-CM | POA: Diagnosis not present

## 2014-08-30 DIAGNOSIS — R1310 Dysphagia, unspecified: Secondary | ICD-10-CM | POA: Diagnosis not present

## 2014-08-30 DIAGNOSIS — R29898 Other symptoms and signs involving the musculoskeletal system: Secondary | ICD-10-CM | POA: Diagnosis not present

## 2014-08-31 DIAGNOSIS — R29898 Other symptoms and signs involving the musculoskeletal system: Secondary | ICD-10-CM | POA: Diagnosis not present

## 2014-08-31 DIAGNOSIS — M6281 Muscle weakness (generalized): Secondary | ICD-10-CM | POA: Diagnosis not present

## 2014-08-31 DIAGNOSIS — R29818 Other symptoms and signs involving the nervous system: Secondary | ICD-10-CM | POA: Diagnosis not present

## 2014-08-31 DIAGNOSIS — R269 Unspecified abnormalities of gait and mobility: Secondary | ICD-10-CM | POA: Diagnosis not present

## 2014-08-31 DIAGNOSIS — R1310 Dysphagia, unspecified: Secondary | ICD-10-CM | POA: Diagnosis not present

## 2014-09-01 DIAGNOSIS — R29898 Other symptoms and signs involving the musculoskeletal system: Secondary | ICD-10-CM | POA: Diagnosis not present

## 2014-09-01 DIAGNOSIS — R269 Unspecified abnormalities of gait and mobility: Secondary | ICD-10-CM | POA: Diagnosis not present

## 2014-09-01 DIAGNOSIS — M6281 Muscle weakness (generalized): Secondary | ICD-10-CM | POA: Diagnosis not present

## 2014-09-01 DIAGNOSIS — R1310 Dysphagia, unspecified: Secondary | ICD-10-CM | POA: Diagnosis not present

## 2014-09-01 DIAGNOSIS — R29818 Other symptoms and signs involving the nervous system: Secondary | ICD-10-CM | POA: Diagnosis not present

## 2014-09-02 DIAGNOSIS — H04123 Dry eye syndrome of bilateral lacrimal glands: Secondary | ICD-10-CM | POA: Diagnosis not present

## 2014-09-02 DIAGNOSIS — H524 Presbyopia: Secondary | ICD-10-CM | POA: Diagnosis not present

## 2014-09-02 DIAGNOSIS — R269 Unspecified abnormalities of gait and mobility: Secondary | ICD-10-CM | POA: Diagnosis not present

## 2014-09-02 DIAGNOSIS — Z961 Presence of intraocular lens: Secondary | ICD-10-CM | POA: Diagnosis not present

## 2014-09-02 DIAGNOSIS — R29818 Other symptoms and signs involving the nervous system: Secondary | ICD-10-CM | POA: Diagnosis not present

## 2014-09-02 DIAGNOSIS — R29898 Other symptoms and signs involving the musculoskeletal system: Secondary | ICD-10-CM | POA: Diagnosis not present

## 2014-09-02 DIAGNOSIS — R1310 Dysphagia, unspecified: Secondary | ICD-10-CM | POA: Diagnosis not present

## 2014-09-02 DIAGNOSIS — M6281 Muscle weakness (generalized): Secondary | ICD-10-CM | POA: Diagnosis not present

## 2014-09-04 DIAGNOSIS — R29898 Other symptoms and signs involving the musculoskeletal system: Secondary | ICD-10-CM | POA: Diagnosis not present

## 2014-09-04 DIAGNOSIS — R29818 Other symptoms and signs involving the nervous system: Secondary | ICD-10-CM | POA: Diagnosis not present

## 2014-09-04 DIAGNOSIS — R1310 Dysphagia, unspecified: Secondary | ICD-10-CM | POA: Diagnosis not present

## 2014-09-04 DIAGNOSIS — R269 Unspecified abnormalities of gait and mobility: Secondary | ICD-10-CM | POA: Diagnosis not present

## 2014-09-04 DIAGNOSIS — M6281 Muscle weakness (generalized): Secondary | ICD-10-CM | POA: Diagnosis not present

## 2014-09-05 DIAGNOSIS — R29818 Other symptoms and signs involving the nervous system: Secondary | ICD-10-CM | POA: Diagnosis not present

## 2014-09-05 DIAGNOSIS — R1310 Dysphagia, unspecified: Secondary | ICD-10-CM | POA: Diagnosis not present

## 2014-09-05 DIAGNOSIS — M6281 Muscle weakness (generalized): Secondary | ICD-10-CM | POA: Diagnosis not present

## 2014-09-05 DIAGNOSIS — R29898 Other symptoms and signs involving the musculoskeletal system: Secondary | ICD-10-CM | POA: Diagnosis not present

## 2014-09-05 DIAGNOSIS — R269 Unspecified abnormalities of gait and mobility: Secondary | ICD-10-CM | POA: Diagnosis not present

## 2014-09-06 DIAGNOSIS — R269 Unspecified abnormalities of gait and mobility: Secondary | ICD-10-CM | POA: Diagnosis not present

## 2014-09-06 DIAGNOSIS — M6281 Muscle weakness (generalized): Secondary | ICD-10-CM | POA: Diagnosis not present

## 2014-09-06 DIAGNOSIS — R29818 Other symptoms and signs involving the nervous system: Secondary | ICD-10-CM | POA: Diagnosis not present

## 2014-09-06 DIAGNOSIS — R29898 Other symptoms and signs involving the musculoskeletal system: Secondary | ICD-10-CM | POA: Diagnosis not present

## 2014-09-07 DIAGNOSIS — M6281 Muscle weakness (generalized): Secondary | ICD-10-CM | POA: Diagnosis not present

## 2014-09-07 DIAGNOSIS — R29818 Other symptoms and signs involving the nervous system: Secondary | ICD-10-CM | POA: Diagnosis not present

## 2014-09-07 DIAGNOSIS — R269 Unspecified abnormalities of gait and mobility: Secondary | ICD-10-CM | POA: Diagnosis not present

## 2014-09-07 DIAGNOSIS — R29898 Other symptoms and signs involving the musculoskeletal system: Secondary | ICD-10-CM | POA: Diagnosis not present

## 2014-09-08 DIAGNOSIS — M6281 Muscle weakness (generalized): Secondary | ICD-10-CM | POA: Diagnosis not present

## 2014-09-08 DIAGNOSIS — R29818 Other symptoms and signs involving the nervous system: Secondary | ICD-10-CM | POA: Diagnosis not present

## 2014-09-08 DIAGNOSIS — R29898 Other symptoms and signs involving the musculoskeletal system: Secondary | ICD-10-CM | POA: Diagnosis not present

## 2014-09-08 DIAGNOSIS — R269 Unspecified abnormalities of gait and mobility: Secondary | ICD-10-CM | POA: Diagnosis not present

## 2014-09-09 DIAGNOSIS — R29818 Other symptoms and signs involving the nervous system: Secondary | ICD-10-CM | POA: Diagnosis not present

## 2014-09-09 DIAGNOSIS — R269 Unspecified abnormalities of gait and mobility: Secondary | ICD-10-CM | POA: Diagnosis not present

## 2014-09-09 DIAGNOSIS — R29898 Other symptoms and signs involving the musculoskeletal system: Secondary | ICD-10-CM | POA: Diagnosis not present

## 2014-09-09 DIAGNOSIS — M6281 Muscle weakness (generalized): Secondary | ICD-10-CM | POA: Diagnosis not present

## 2014-09-13 DIAGNOSIS — R269 Unspecified abnormalities of gait and mobility: Secondary | ICD-10-CM | POA: Diagnosis not present

## 2014-09-13 DIAGNOSIS — M6281 Muscle weakness (generalized): Secondary | ICD-10-CM | POA: Diagnosis not present

## 2014-09-13 DIAGNOSIS — R29898 Other symptoms and signs involving the musculoskeletal system: Secondary | ICD-10-CM | POA: Diagnosis not present

## 2014-09-13 DIAGNOSIS — R29818 Other symptoms and signs involving the nervous system: Secondary | ICD-10-CM | POA: Diagnosis not present

## 2014-09-15 DIAGNOSIS — M6281 Muscle weakness (generalized): Secondary | ICD-10-CM | POA: Diagnosis not present

## 2014-09-15 DIAGNOSIS — R29818 Other symptoms and signs involving the nervous system: Secondary | ICD-10-CM | POA: Diagnosis not present

## 2014-09-15 DIAGNOSIS — R29898 Other symptoms and signs involving the musculoskeletal system: Secondary | ICD-10-CM | POA: Diagnosis not present

## 2014-09-15 DIAGNOSIS — R269 Unspecified abnormalities of gait and mobility: Secondary | ICD-10-CM | POA: Diagnosis not present

## 2014-09-16 DIAGNOSIS — R29818 Other symptoms and signs involving the nervous system: Secondary | ICD-10-CM | POA: Diagnosis not present

## 2014-09-16 DIAGNOSIS — R29898 Other symptoms and signs involving the musculoskeletal system: Secondary | ICD-10-CM | POA: Diagnosis not present

## 2014-09-16 DIAGNOSIS — R269 Unspecified abnormalities of gait and mobility: Secondary | ICD-10-CM | POA: Diagnosis not present

## 2014-09-16 DIAGNOSIS — M6281 Muscle weakness (generalized): Secondary | ICD-10-CM | POA: Diagnosis not present

## 2014-09-17 DIAGNOSIS — R29818 Other symptoms and signs involving the nervous system: Secondary | ICD-10-CM | POA: Diagnosis not present

## 2014-09-17 DIAGNOSIS — R269 Unspecified abnormalities of gait and mobility: Secondary | ICD-10-CM | POA: Diagnosis not present

## 2014-09-17 DIAGNOSIS — R29898 Other symptoms and signs involving the musculoskeletal system: Secondary | ICD-10-CM | POA: Diagnosis not present

## 2014-09-17 DIAGNOSIS — M6281 Muscle weakness (generalized): Secondary | ICD-10-CM | POA: Diagnosis not present

## 2014-09-18 DIAGNOSIS — R269 Unspecified abnormalities of gait and mobility: Secondary | ICD-10-CM | POA: Diagnosis not present

## 2014-09-18 DIAGNOSIS — R29818 Other symptoms and signs involving the nervous system: Secondary | ICD-10-CM | POA: Diagnosis not present

## 2014-09-18 DIAGNOSIS — M6281 Muscle weakness (generalized): Secondary | ICD-10-CM | POA: Diagnosis not present

## 2014-09-18 DIAGNOSIS — R29898 Other symptoms and signs involving the musculoskeletal system: Secondary | ICD-10-CM | POA: Diagnosis not present

## 2014-10-05 DIAGNOSIS — E559 Vitamin D deficiency, unspecified: Secondary | ICD-10-CM | POA: Diagnosis not present

## 2014-10-14 DIAGNOSIS — Z79899 Other long term (current) drug therapy: Secondary | ICD-10-CM | POA: Diagnosis not present

## 2014-10-24 DIAGNOSIS — E785 Hyperlipidemia, unspecified: Secondary | ICD-10-CM | POA: Diagnosis not present

## 2014-10-24 DIAGNOSIS — K644 Residual hemorrhoidal skin tags: Secondary | ICD-10-CM | POA: Diagnosis not present

## 2014-10-24 DIAGNOSIS — R600 Localized edema: Secondary | ICD-10-CM | POA: Diagnosis not present

## 2014-10-24 DIAGNOSIS — I2510 Atherosclerotic heart disease of native coronary artery without angina pectoris: Secondary | ICD-10-CM | POA: Diagnosis not present

## 2014-11-02 DIAGNOSIS — H3531 Nonexudative age-related macular degeneration: Secondary | ICD-10-CM | POA: Diagnosis not present

## 2014-11-09 DIAGNOSIS — K648 Other hemorrhoids: Secondary | ICD-10-CM | POA: Diagnosis not present

## 2014-11-09 DIAGNOSIS — K644 Residual hemorrhoidal skin tags: Secondary | ICD-10-CM | POA: Diagnosis not present

## 2014-11-18 DIAGNOSIS — N390 Urinary tract infection, site not specified: Secondary | ICD-10-CM | POA: Diagnosis not present

## 2014-11-21 DIAGNOSIS — K649 Unspecified hemorrhoids: Secondary | ICD-10-CM | POA: Diagnosis not present

## 2014-11-21 DIAGNOSIS — R05 Cough: Secondary | ICD-10-CM | POA: Diagnosis not present

## 2014-11-21 DIAGNOSIS — R509 Fever, unspecified: Secondary | ICD-10-CM | POA: Diagnosis not present

## 2014-11-22 DIAGNOSIS — R509 Fever, unspecified: Secondary | ICD-10-CM | POA: Diagnosis not present

## 2014-11-22 DIAGNOSIS — Z79899 Other long term (current) drug therapy: Secondary | ICD-10-CM | POA: Diagnosis not present

## 2014-12-09 DIAGNOSIS — M79675 Pain in left toe(s): Secondary | ICD-10-CM | POA: Diagnosis not present

## 2014-12-09 DIAGNOSIS — M79674 Pain in right toe(s): Secondary | ICD-10-CM | POA: Diagnosis not present

## 2014-12-09 DIAGNOSIS — I2510 Atherosclerotic heart disease of native coronary artery without angina pectoris: Secondary | ICD-10-CM | POA: Diagnosis not present

## 2014-12-09 DIAGNOSIS — B351 Tinea unguium: Secondary | ICD-10-CM | POA: Diagnosis not present

## 2015-02-02 DIAGNOSIS — E039 Hypothyroidism, unspecified: Secondary | ICD-10-CM | POA: Diagnosis not present

## 2015-02-02 DIAGNOSIS — E785 Hyperlipidemia, unspecified: Secondary | ICD-10-CM | POA: Diagnosis not present

## 2015-02-02 DIAGNOSIS — Z79899 Other long term (current) drug therapy: Secondary | ICD-10-CM | POA: Diagnosis not present

## 2015-02-15 DIAGNOSIS — Z79899 Other long term (current) drug therapy: Secondary | ICD-10-CM | POA: Diagnosis not present

## 2015-02-15 DIAGNOSIS — R0602 Shortness of breath: Secondary | ICD-10-CM | POA: Diagnosis not present

## 2015-02-15 DIAGNOSIS — R609 Edema, unspecified: Secondary | ICD-10-CM | POA: Diagnosis not present

## 2015-02-23 DIAGNOSIS — N189 Chronic kidney disease, unspecified: Secondary | ICD-10-CM | POA: Diagnosis not present

## 2015-02-23 DIAGNOSIS — I872 Venous insufficiency (chronic) (peripheral): Secondary | ICD-10-CM | POA: Diagnosis not present

## 2015-02-23 DIAGNOSIS — K649 Unspecified hemorrhoids: Secondary | ICD-10-CM | POA: Diagnosis not present

## 2015-03-18 DIAGNOSIS — D649 Anemia, unspecified: Secondary | ICD-10-CM | POA: Diagnosis not present

## 2015-03-20 DIAGNOSIS — I2510 Atherosclerotic heart disease of native coronary artery without angina pectoris: Secondary | ICD-10-CM | POA: Diagnosis not present

## 2015-03-20 DIAGNOSIS — R1032 Left lower quadrant pain: Secondary | ICD-10-CM | POA: Diagnosis not present

## 2015-03-20 DIAGNOSIS — N189 Chronic kidney disease, unspecified: Secondary | ICD-10-CM | POA: Diagnosis not present

## 2015-03-20 DIAGNOSIS — R600 Localized edema: Secondary | ICD-10-CM | POA: Diagnosis not present

## 2015-03-20 DIAGNOSIS — E785 Hyperlipidemia, unspecified: Secondary | ICD-10-CM | POA: Diagnosis not present

## 2015-04-18 DIAGNOSIS — R05 Cough: Secondary | ICD-10-CM | POA: Diagnosis not present

## 2015-04-19 DIAGNOSIS — R0602 Shortness of breath: Secondary | ICD-10-CM | POA: Diagnosis not present

## 2015-04-21 DIAGNOSIS — R0689 Other abnormalities of breathing: Secondary | ICD-10-CM | POA: Diagnosis not present

## 2015-04-21 DIAGNOSIS — Z79899 Other long term (current) drug therapy: Secondary | ICD-10-CM | POA: Diagnosis not present

## 2015-04-26 DIAGNOSIS — D649 Anemia, unspecified: Secondary | ICD-10-CM | POA: Diagnosis not present

## 2015-04-26 DIAGNOSIS — Z79899 Other long term (current) drug therapy: Secondary | ICD-10-CM | POA: Diagnosis not present

## 2015-06-15 DIAGNOSIS — M79672 Pain in left foot: Secondary | ICD-10-CM | POA: Diagnosis not present

## 2015-06-15 DIAGNOSIS — B351 Tinea unguium: Secondary | ICD-10-CM | POA: Diagnosis not present

## 2015-06-15 DIAGNOSIS — M79671 Pain in right foot: Secondary | ICD-10-CM | POA: Diagnosis not present

## 2015-06-15 DIAGNOSIS — I2510 Atherosclerotic heart disease of native coronary artery without angina pectoris: Secondary | ICD-10-CM | POA: Diagnosis not present

## 2015-07-03 DIAGNOSIS — M170 Bilateral primary osteoarthritis of knee: Secondary | ICD-10-CM | POA: Diagnosis not present

## 2015-07-03 DIAGNOSIS — K649 Unspecified hemorrhoids: Secondary | ICD-10-CM | POA: Diagnosis not present

## 2015-07-08 DIAGNOSIS — R7989 Other specified abnormal findings of blood chemistry: Secondary | ICD-10-CM | POA: Diagnosis not present

## 2015-07-08 DIAGNOSIS — R05 Cough: Secondary | ICD-10-CM | POA: Diagnosis not present

## 2015-07-08 DIAGNOSIS — D649 Anemia, unspecified: Secondary | ICD-10-CM | POA: Diagnosis not present

## 2015-07-10 DIAGNOSIS — R05 Cough: Secondary | ICD-10-CM | POA: Diagnosis not present

## 2015-07-10 DIAGNOSIS — I10 Essential (primary) hypertension: Secondary | ICD-10-CM | POA: Diagnosis not present

## 2015-07-15 DIAGNOSIS — K219 Gastro-esophageal reflux disease without esophagitis: Secondary | ICD-10-CM | POA: Diagnosis not present

## 2015-07-15 DIAGNOSIS — I872 Venous insufficiency (chronic) (peripheral): Secondary | ICD-10-CM | POA: Diagnosis not present

## 2015-07-15 DIAGNOSIS — E042 Nontoxic multinodular goiter: Secondary | ICD-10-CM | POA: Diagnosis not present

## 2015-07-15 DIAGNOSIS — I2510 Atherosclerotic heart disease of native coronary artery without angina pectoris: Secondary | ICD-10-CM | POA: Diagnosis not present

## 2015-07-15 DIAGNOSIS — K8020 Calculus of gallbladder without cholecystitis without obstruction: Secondary | ICD-10-CM | POA: Diagnosis not present

## 2015-07-15 DIAGNOSIS — I671 Cerebral aneurysm, nonruptured: Secondary | ICD-10-CM | POA: Diagnosis not present

## 2015-07-15 DIAGNOSIS — E039 Hypothyroidism, unspecified: Secondary | ICD-10-CM | POA: Diagnosis not present

## 2015-07-27 DIAGNOSIS — M25561 Pain in right knee: Secondary | ICD-10-CM | POA: Diagnosis not present

## 2015-07-27 DIAGNOSIS — G8929 Other chronic pain: Secondary | ICD-10-CM | POA: Diagnosis not present

## 2015-07-27 DIAGNOSIS — M25562 Pain in left knee: Secondary | ICD-10-CM | POA: Diagnosis not present

## 2015-08-16 DIAGNOSIS — M7989 Other specified soft tissue disorders: Secondary | ICD-10-CM | POA: Diagnosis not present

## 2015-08-16 DIAGNOSIS — L539 Erythematous condition, unspecified: Secondary | ICD-10-CM | POA: Diagnosis not present

## 2015-08-16 DIAGNOSIS — R05 Cough: Secondary | ICD-10-CM | POA: Diagnosis not present

## 2015-08-16 DIAGNOSIS — Z79899 Other long term (current) drug therapy: Secondary | ICD-10-CM | POA: Diagnosis not present

## 2015-08-31 DIAGNOSIS — Z79899 Other long term (current) drug therapy: Secondary | ICD-10-CM | POA: Diagnosis not present

## 2015-08-31 DIAGNOSIS — R0689 Other abnormalities of breathing: Secondary | ICD-10-CM | POA: Diagnosis not present

## 2015-09-21 DIAGNOSIS — Z9181 History of falling: Secondary | ICD-10-CM | POA: Diagnosis not present

## 2015-09-21 DIAGNOSIS — M6281 Muscle weakness (generalized): Secondary | ICD-10-CM | POA: Diagnosis not present

## 2015-09-21 DIAGNOSIS — R293 Abnormal posture: Secondary | ICD-10-CM | POA: Diagnosis not present

## 2015-09-23 DIAGNOSIS — R293 Abnormal posture: Secondary | ICD-10-CM | POA: Diagnosis not present

## 2015-09-23 DIAGNOSIS — Z9181 History of falling: Secondary | ICD-10-CM | POA: Diagnosis not present

## 2015-09-23 DIAGNOSIS — M6281 Muscle weakness (generalized): Secondary | ICD-10-CM | POA: Diagnosis not present

## 2015-09-27 DIAGNOSIS — M6281 Muscle weakness (generalized): Secondary | ICD-10-CM | POA: Diagnosis not present

## 2015-09-27 DIAGNOSIS — R293 Abnormal posture: Secondary | ICD-10-CM | POA: Diagnosis not present

## 2015-09-27 DIAGNOSIS — Z9181 History of falling: Secondary | ICD-10-CM | POA: Diagnosis not present

## 2015-10-04 DIAGNOSIS — K219 Gastro-esophageal reflux disease without esophagitis: Secondary | ICD-10-CM | POA: Diagnosis not present

## 2015-10-04 DIAGNOSIS — K8020 Calculus of gallbladder without cholecystitis without obstruction: Secondary | ICD-10-CM | POA: Diagnosis not present

## 2015-10-04 DIAGNOSIS — E039 Hypothyroidism, unspecified: Secondary | ICD-10-CM | POA: Diagnosis not present

## 2015-10-04 DIAGNOSIS — E042 Nontoxic multinodular goiter: Secondary | ICD-10-CM | POA: Diagnosis not present

## 2015-10-04 DIAGNOSIS — M6281 Muscle weakness (generalized): Secondary | ICD-10-CM | POA: Diagnosis not present

## 2015-10-04 DIAGNOSIS — I2510 Atherosclerotic heart disease of native coronary artery without angina pectoris: Secondary | ICD-10-CM | POA: Diagnosis not present

## 2015-10-04 DIAGNOSIS — R293 Abnormal posture: Secondary | ICD-10-CM | POA: Diagnosis not present

## 2015-10-04 DIAGNOSIS — E559 Vitamin D deficiency, unspecified: Secondary | ICD-10-CM | POA: Diagnosis not present

## 2015-10-04 DIAGNOSIS — I671 Cerebral aneurysm, nonruptured: Secondary | ICD-10-CM | POA: Diagnosis not present

## 2015-10-04 DIAGNOSIS — I872 Venous insufficiency (chronic) (peripheral): Secondary | ICD-10-CM | POA: Diagnosis not present

## 2015-10-04 DIAGNOSIS — Z9181 History of falling: Secondary | ICD-10-CM | POA: Diagnosis not present

## 2015-10-05 DIAGNOSIS — R0689 Other abnormalities of breathing: Secondary | ICD-10-CM | POA: Diagnosis not present

## 2015-10-05 DIAGNOSIS — Z79899 Other long term (current) drug therapy: Secondary | ICD-10-CM | POA: Diagnosis not present

## 2015-10-06 DIAGNOSIS — J45909 Unspecified asthma, uncomplicated: Secondary | ICD-10-CM | POA: Diagnosis not present

## 2015-10-06 DIAGNOSIS — Z9181 History of falling: Secondary | ICD-10-CM | POA: Diagnosis not present

## 2015-10-06 DIAGNOSIS — K5730 Diverticulosis of large intestine without perforation or abscess without bleeding: Secondary | ICD-10-CM | POA: Diagnosis not present

## 2015-10-06 DIAGNOSIS — R296 Repeated falls: Secondary | ICD-10-CM | POA: Diagnosis not present

## 2015-10-09 DIAGNOSIS — R600 Localized edema: Secondary | ICD-10-CM | POA: Diagnosis not present

## 2015-10-09 DIAGNOSIS — R05 Cough: Secondary | ICD-10-CM | POA: Diagnosis not present

## 2015-10-09 DIAGNOSIS — E119 Type 2 diabetes mellitus without complications: Secondary | ICD-10-CM | POA: Diagnosis not present

## 2015-10-10 DIAGNOSIS — R609 Edema, unspecified: Secondary | ICD-10-CM | POA: Diagnosis not present

## 2015-10-10 DIAGNOSIS — R05 Cough: Secondary | ICD-10-CM | POA: Diagnosis not present

## 2015-10-12 ENCOUNTER — Other Ambulatory Visit (HOSPITAL_COMMUNITY): Payer: Self-pay | Admitting: Internal Medicine

## 2015-10-12 DIAGNOSIS — R140 Abdominal distension (gaseous): Secondary | ICD-10-CM

## 2015-10-18 ENCOUNTER — Ambulatory Visit (HOSPITAL_COMMUNITY): Payer: Medicare Other

## 2015-10-18 DIAGNOSIS — M25559 Pain in unspecified hip: Secondary | ICD-10-CM | POA: Diagnosis not present

## 2015-10-18 DIAGNOSIS — M25569 Pain in unspecified knee: Secondary | ICD-10-CM | POA: Diagnosis not present

## 2015-10-19 DIAGNOSIS — Z9181 History of falling: Secondary | ICD-10-CM | POA: Diagnosis not present

## 2015-10-19 DIAGNOSIS — K5730 Diverticulosis of large intestine without perforation or abscess without bleeding: Secondary | ICD-10-CM | POA: Diagnosis not present

## 2015-10-19 DIAGNOSIS — J45909 Unspecified asthma, uncomplicated: Secondary | ICD-10-CM | POA: Diagnosis not present

## 2015-10-19 DIAGNOSIS — R296 Repeated falls: Secondary | ICD-10-CM | POA: Diagnosis not present

## 2015-10-20 ENCOUNTER — Ambulatory Visit (HOSPITAL_COMMUNITY)
Admission: RE | Admit: 2015-10-20 | Discharge: 2015-10-20 | Disposition: A | Discharge status: Home / Self Care | Payer: Medicare Other | Source: Ambulatory Visit | Attending: Internal Medicine | Admitting: Internal Medicine

## 2015-10-20 DIAGNOSIS — Z9181 History of falling: Secondary | ICD-10-CM | POA: Diagnosis not present

## 2015-10-20 DIAGNOSIS — J9811 Atelectasis: Secondary | ICD-10-CM | POA: Insufficient documentation

## 2015-10-20 DIAGNOSIS — I728 Aneurysm of other specified arteries: Secondary | ICD-10-CM | POA: Diagnosis not present

## 2015-10-20 DIAGNOSIS — K5710 Diverticulosis of small intestine without perforation or abscess without bleeding: Secondary | ICD-10-CM | POA: Diagnosis not present

## 2015-10-20 DIAGNOSIS — K5730 Diverticulosis of large intestine without perforation or abscess without bleeding: Secondary | ICD-10-CM | POA: Insufficient documentation

## 2015-10-20 DIAGNOSIS — R140 Abdominal distension (gaseous): Secondary | ICD-10-CM

## 2015-10-20 DIAGNOSIS — R296 Repeated falls: Secondary | ICD-10-CM | POA: Diagnosis not present

## 2015-10-20 DIAGNOSIS — J45909 Unspecified asthma, uncomplicated: Secondary | ICD-10-CM | POA: Diagnosis not present

## 2015-10-21 DIAGNOSIS — Z79899 Other long term (current) drug therapy: Secondary | ICD-10-CM | POA: Diagnosis not present

## 2015-10-21 DIAGNOSIS — D649 Anemia, unspecified: Secondary | ICD-10-CM | POA: Diagnosis not present

## 2015-10-23 DIAGNOSIS — R410 Disorientation, unspecified: Secondary | ICD-10-CM | POA: Diagnosis not present

## 2015-10-23 DIAGNOSIS — R05 Cough: Secondary | ICD-10-CM | POA: Diagnosis not present

## 2015-10-23 DIAGNOSIS — N179 Acute kidney failure, unspecified: Secondary | ICD-10-CM | POA: Diagnosis not present

## 2015-10-24 DIAGNOSIS — Z79899 Other long term (current) drug therapy: Secondary | ICD-10-CM | POA: Diagnosis not present

## 2015-10-25 DIAGNOSIS — L988 Other specified disorders of the skin and subcutaneous tissue: Secondary | ICD-10-CM | POA: Diagnosis not present

## 2015-10-25 DIAGNOSIS — R296 Repeated falls: Secondary | ICD-10-CM | POA: Diagnosis not present

## 2015-10-25 DIAGNOSIS — Z9181 History of falling: Secondary | ICD-10-CM | POA: Diagnosis not present

## 2015-10-25 DIAGNOSIS — J45909 Unspecified asthma, uncomplicated: Secondary | ICD-10-CM | POA: Diagnosis not present

## 2015-10-25 DIAGNOSIS — K5730 Diverticulosis of large intestine without perforation or abscess without bleeding: Secondary | ICD-10-CM | POA: Diagnosis not present

## 2015-10-26 DIAGNOSIS — Z9181 History of falling: Secondary | ICD-10-CM | POA: Diagnosis not present

## 2015-10-26 DIAGNOSIS — K5730 Diverticulosis of large intestine without perforation or abscess without bleeding: Secondary | ICD-10-CM | POA: Diagnosis not present

## 2015-10-26 DIAGNOSIS — J45909 Unspecified asthma, uncomplicated: Secondary | ICD-10-CM | POA: Diagnosis not present

## 2015-10-26 DIAGNOSIS — R296 Repeated falls: Secondary | ICD-10-CM | POA: Diagnosis not present

## 2015-10-27 DIAGNOSIS — J45909 Unspecified asthma, uncomplicated: Secondary | ICD-10-CM | POA: Diagnosis not present

## 2015-10-27 DIAGNOSIS — Z9181 History of falling: Secondary | ICD-10-CM | POA: Diagnosis not present

## 2015-10-27 DIAGNOSIS — R296 Repeated falls: Secondary | ICD-10-CM | POA: Diagnosis not present

## 2015-10-27 DIAGNOSIS — K5730 Diverticulosis of large intestine without perforation or abscess without bleeding: Secondary | ICD-10-CM | POA: Diagnosis not present

## 2015-10-28 ENCOUNTER — Emergency Department (HOSPITAL_COMMUNITY)
Admission: EM | Admit: 2015-10-28 | Discharge: 2015-10-28 | Disposition: A | Discharge status: Home / Self Care | Payer: Medicare Other | Attending: Emergency Medicine | Admitting: Emergency Medicine

## 2015-10-28 ENCOUNTER — Emergency Department (HOSPITAL_COMMUNITY): Payer: Medicare Other

## 2015-10-28 ENCOUNTER — Encounter (HOSPITAL_COMMUNITY): Payer: Self-pay

## 2015-10-28 DIAGNOSIS — I2510 Atherosclerotic heart disease of native coronary artery without angina pectoris: Secondary | ICD-10-CM | POA: Diagnosis not present

## 2015-10-28 DIAGNOSIS — Z8659 Personal history of other mental and behavioral disorders: Secondary | ICD-10-CM | POA: Insufficient documentation

## 2015-10-28 DIAGNOSIS — E039 Hypothyroidism, unspecified: Secondary | ICD-10-CM | POA: Insufficient documentation

## 2015-10-28 DIAGNOSIS — M79662 Pain in left lower leg: Secondary | ICD-10-CM | POA: Insufficient documentation

## 2015-10-28 DIAGNOSIS — E7800 Pure hypercholesterolemia, unspecified: Secondary | ICD-10-CM | POA: Insufficient documentation

## 2015-10-28 DIAGNOSIS — R404 Transient alteration of awareness: Secondary | ICD-10-CM | POA: Diagnosis not present

## 2015-10-28 DIAGNOSIS — Z7982 Long term (current) use of aspirin: Secondary | ICD-10-CM | POA: Insufficient documentation

## 2015-10-28 DIAGNOSIS — G309 Alzheimer's disease, unspecified: Secondary | ICD-10-CM | POA: Diagnosis not present

## 2015-10-28 DIAGNOSIS — Z862 Personal history of diseases of the blood and blood-forming organs and certain disorders involving the immune mechanism: Secondary | ICD-10-CM | POA: Insufficient documentation

## 2015-10-28 DIAGNOSIS — K5900 Constipation, unspecified: Secondary | ICD-10-CM | POA: Insufficient documentation

## 2015-10-28 DIAGNOSIS — E876 Hypokalemia: Secondary | ICD-10-CM | POA: Insufficient documentation

## 2015-10-28 DIAGNOSIS — J45909 Unspecified asthma, uncomplicated: Secondary | ICD-10-CM | POA: Insufficient documentation

## 2015-10-28 DIAGNOSIS — E785 Hyperlipidemia, unspecified: Secondary | ICD-10-CM | POA: Diagnosis not present

## 2015-10-28 DIAGNOSIS — I10 Essential (primary) hypertension: Secondary | ICD-10-CM | POA: Insufficient documentation

## 2015-10-28 DIAGNOSIS — F0280 Dementia in other diseases classified elsewhere without behavioral disturbance: Secondary | ICD-10-CM | POA: Diagnosis not present

## 2015-10-28 DIAGNOSIS — R410 Disorientation, unspecified: Secondary | ICD-10-CM | POA: Diagnosis not present

## 2015-10-28 DIAGNOSIS — R609 Edema, unspecified: Secondary | ICD-10-CM | POA: Diagnosis not present

## 2015-10-28 DIAGNOSIS — K5730 Diverticulosis of large intestine without perforation or abscess without bleeding: Secondary | ICD-10-CM | POA: Diagnosis not present

## 2015-10-28 DIAGNOSIS — Z8673 Personal history of transient ischemic attack (TIA), and cerebral infarction without residual deficits: Secondary | ICD-10-CM | POA: Insufficient documentation

## 2015-10-28 DIAGNOSIS — R600 Localized edema: Secondary | ICD-10-CM

## 2015-10-28 DIAGNOSIS — M79661 Pain in right lower leg: Secondary | ICD-10-CM | POA: Insufficient documentation

## 2015-10-28 DIAGNOSIS — R296 Repeated falls: Secondary | ICD-10-CM | POA: Diagnosis not present

## 2015-10-28 DIAGNOSIS — R531 Weakness: Secondary | ICD-10-CM | POA: Diagnosis not present

## 2015-10-28 DIAGNOSIS — Z9181 History of falling: Secondary | ICD-10-CM | POA: Diagnosis not present

## 2015-10-28 DIAGNOSIS — Z87440 Personal history of urinary (tract) infections: Secondary | ICD-10-CM | POA: Insufficient documentation

## 2015-10-28 HISTORY — DX: Dementia in other diseases classified elsewhere, unspecified severity, without behavioral disturbance, psychotic disturbance, mood disturbance, and anxiety: F02.80

## 2015-10-28 HISTORY — DX: Vitamin D deficiency, unspecified: E55.9

## 2015-10-28 HISTORY — DX: Calculus of gallbladder without cholecystitis without obstruction: K80.20

## 2015-10-28 HISTORY — DX: Alzheimer's disease, unspecified: G30.9

## 2015-10-28 HISTORY — DX: History of falling: Z91.81

## 2015-10-28 HISTORY — DX: Constipation, unspecified: K59.00

## 2015-10-28 HISTORY — DX: Hyperlipidemia, unspecified: E78.5

## 2015-10-28 HISTORY — DX: Unspecified abnormalities of gait and mobility: R26.9

## 2015-10-28 HISTORY — DX: Nontoxic multinodular goiter: E04.2

## 2015-10-28 HISTORY — DX: Hypokalemia: E87.6

## 2015-10-28 LAB — CBC WITH DIFFERENTIAL/PLATELET
BASOS ABS: 0 10*3/uL (ref 0.0–0.1)
BASOS PCT: 0 %
EOS ABS: 0.1 10*3/uL (ref 0.0–0.7)
Eosinophils Relative: 1 %
HEMATOCRIT: 43.8 % (ref 36.0–46.0)
HEMOGLOBIN: 14.8 g/dL (ref 12.0–15.0)
Lymphocytes Relative: 20 %
Lymphs Abs: 1.6 10*3/uL (ref 0.7–4.0)
MCH: 33.3 pg (ref 26.0–34.0)
MCHC: 33.8 g/dL (ref 30.0–36.0)
MCV: 98.4 fL (ref 78.0–100.0)
Monocytes Absolute: 0.6 10*3/uL (ref 0.1–1.0)
Monocytes Relative: 7 %
NEUTROS ABS: 5.9 10*3/uL (ref 1.7–7.7)
NEUTROS PCT: 72 %
Platelets: 158 10*3/uL (ref 150–400)
RBC: 4.45 MIL/uL (ref 3.87–5.11)
RDW: 13.9 % (ref 11.5–15.5)
WBC: 8.2 10*3/uL (ref 4.0–10.5)

## 2015-10-28 LAB — COMPREHENSIVE METABOLIC PANEL
ALT: 37 U/L (ref 14–54)
AST: 43 U/L — AB (ref 15–41)
Albumin: 3.1 g/dL — ABNORMAL LOW (ref 3.5–5.0)
Alkaline Phosphatase: 116 U/L (ref 38–126)
Anion gap: 10 (ref 5–15)
BUN: 31 mg/dL — AB (ref 6–20)
CHLORIDE: 102 mmol/L (ref 101–111)
CO2: 27 mmol/L (ref 22–32)
Calcium: 9.9 mg/dL (ref 8.9–10.3)
Creatinine, Ser: 1.55 mg/dL — ABNORMAL HIGH (ref 0.44–1.00)
GFR calc Af Amer: 35 mL/min — ABNORMAL LOW (ref >60)
GFR, EST NON AFRICAN AMERICAN: 30 mL/min — AB (ref >60)
Glucose, Bld: 153 mg/dL — ABNORMAL HIGH (ref 65–99)
POTASSIUM: 3.7 mmol/L (ref 3.5–5.1)
SODIUM: 139 mmol/L (ref 135–145)
Total Bilirubin: 0.7 mg/dL (ref 0.3–1.2)
Total Protein: 6.2 g/dL — ABNORMAL LOW (ref 6.5–8.1)

## 2015-10-28 LAB — URINALYSIS, ROUTINE W REFLEX MICROSCOPIC
Bilirubin Urine: NEGATIVE
GLUCOSE, UA: NEGATIVE mg/dL
KETONES UR: NEGATIVE mg/dL
NITRITE: NEGATIVE
PROTEIN: NEGATIVE mg/dL
Specific Gravity, Urine: 1.009 (ref 1.005–1.030)
pH: 5.5 (ref 5.0–8.0)

## 2015-10-28 LAB — URINE MICROSCOPIC-ADD ON

## 2015-10-28 LAB — I-STAT CG4 LACTIC ACID, ED: LACTIC ACID, VENOUS: 2.64 mmol/L — AB (ref 0.5–2.0)

## 2015-10-28 MED ORDER — ACETAMINOPHEN 500 MG PO TABS: 1000.0000 mg | ORAL_TABLET | Freq: Four times a day (QID) | ORAL | Status: DC | PRN

## 2015-10-28 MED ORDER — FUROSEMIDE 40 MG PO TABS: 20.0000 mg | ORAL_TABLET | Freq: Every day | ORAL | Status: DC

## 2015-10-28 NOTE — ED Notes (Signed)
[facility] contacted to transport patient to [facility]

## 2015-10-28 NOTE — Discharge Instructions (Signed)
Edema °Edema is an abnormal buildup of fluids in your body tissues. Edema is somewhat dependent on gravity to pull the fluid to the lowest place in your body. That makes the condition more common in the legs and thighs (lower extremities). Painless swelling of the feet and ankles is common and becomes more likely as you get older. It is also common in looser tissues, like around your eyes.  °When the affected area is squeezed, the fluid may move out of that spot and leave a dent for a few moments. This dent is called pitting.  °CAUSES  °There are many possible causes of edema. Eating too much salt and being on your feet or sitting for a long time can cause edema in your legs and ankles. Hot weather may make edema worse. Common medical causes of edema include: °· Heart failure. °· Liver disease. °· Kidney disease. °· Weak blood vessels in your legs. °· Cancer. °· An injury. °· Pregnancy. °· Some medications. °· Obesity.  °SYMPTOMS  °Edema is usually painless. Your skin may look swollen or shiny.  °DIAGNOSIS  °Your health care provider may be able to diagnose edema by asking about your medical history and doing a physical exam. You may need to have tests such as X-rays, an electrocardiogram, or blood tests to check for medical conditions that may cause edema.  °TREATMENT  °Edema treatment depends on the cause. If you have heart, liver, or kidney disease, you need the treatment appropriate for these conditions. General treatment may include: °· Elevation of the affected body part above the level of your heart. °· Compression of the affected body part. Pressure from elastic bandages or support stockings squeezes the tissues and forces fluid back into the blood vessels. This keeps fluid from entering the tissues. °· Restriction of fluid and salt intake. °· Use of a water pill (diuretic). These medications are appropriate only for some types of edema. They pull fluid out of your body and make you urinate more often. This  gets rid of fluid and reduces swelling, but diuretics can have side effects. Only use diuretics as directed by your health care provider. °HOME CARE INSTRUCTIONS  °· Keep the affected body part above the level of your heart when you are lying down.   °· Do not sit still or stand for prolonged periods.   °· Do not put anything directly under your knees when lying down. °· Do not wear constricting clothing or garters on your upper legs.   °· Exercise your legs to work the fluid back into your blood vessels. This may help the swelling go down.   °· Wear elastic bandages or support stockings to reduce ankle swelling as directed by your health care provider.   °· Eat a low-salt diet to reduce fluid if your health care provider recommends it.   °· Only take medicines as directed by your health care provider.  °SEEK MEDICAL CARE IF:  °· Your edema is not responding to treatment. °· You have heart, liver, or kidney disease and notice symptoms of edema. °· You have edema in your legs that does not improve after elevating them.   °· You have sudden and unexplained weight gain. °SEEK IMMEDIATE MEDICAL CARE IF:  °· You develop shortness of breath or chest pain.   °· You cannot breathe when you lie down. °· You develop pain, redness, or warmth in the swollen areas.   °· You have heart, liver, or kidney disease and suddenly get edema. °· You have a fever and your symptoms suddenly get worse. °MAKE SURE YOU:  °·   Understand these instructions. °· Will watch your condition. °· Will get help right away if you are not doing well or get worse. °  °This information is not intended to replace advice given to you by your health care provider. Make sure you discuss any questions you have with your health care provider. °  °Document Released: 07/23/2005 Document Revised: 08/13/2014 Document Reviewed: 05/15/2013 °Elsevier Interactive Patient Education ©2016 [employer] ° °

## 2015-10-28 NOTE — ED Notes (Signed)
MD assessed patient's legs.

## 2015-10-28 NOTE — ED Provider Notes (Addendum)
CSN: [id number]     Arrival date & time 10/28/15  1234 History   First MD Initiated Contact with Patient 10/28/15 1257     Chief Complaint  Patient presents with  . Leg Pain     (Consider location/radiation/quality/duration/timing/severity/associated sxs/prior Treatment) Patient is a 82 y.o. female presenting with leg pain. The history is provided by the patient and a relative.  Leg Pain Location:  Leg and ankle Time since incident:  2 weeks Injury: no   Leg location:  R lower leg and L lower leg Ankle location:  L ankle and R ankle Pain details:    Quality:  Aching   Radiates to:  Does not radiate   Severity:  Moderate   Onset quality:  Gradual   Duration:  2 weeks   Timing:  Intermittent   Progression:  Waxing and waning Chronicity:  Chronic Prior injury to area:  No Relieved by:  Nothing Worsened by:  Nothing tried Ineffective treatments: compression dressings. Associated symptoms: swelling   Associated symptoms: no decreased ROM, no fever, no numbness and no tingling     Past Medical History  Diagnosis Date  . Allergic rhinitis   . Asthmatic bronchitis   . Hypertension   . Arteriosclerotic heart disease   . Venous insufficiency (chronic) (peripheral)   . Hypercholesteremia   . Hypothyroid   . Nontoxic multinodular goiter   . GERD (gastroesophageal reflux disease)   . Diverticulosis of colon   . Cholelithiasis   . Chronic UTI   . DJD (degenerative joint disease)   . TIA (transient ischemic attack)   . Memory loss   . Intracranial aneurysm   . Anxiety   . Anemia   . Calculus of gallbladder without cholecystitis without obstruction   . Multinodular goiter   . History of falling   . Gait abnormality   . Hyperlipidemia   . Alzheimer's dementia   . Vitamin D deficiency   . Hypokalemia   . Constipation    Past Surgical History  Procedure Laterality Date  . Laparoscopic cholecystectomy  01/2007    Dr. Odie SeraHoxsworth  . Abdominal hysterectomy    . Bilateral  thr's    . Right total knee  11/1999    Dr. Leslee [place]  . Left femur fracture  04/2002    Dr. Ranell PatrickNorris  . Lumbar fusion    . Right total hip    . Left total hip     No family history on file. Social History  Substance Use Topics  . Smoking status: Never Smoker   . Smokeless tobacco: Never Used  . Alcohol Use: No   OB History    No data available     Review of Systems  Constitutional: Negative for fever.  All other systems reviewed and are negative.     Allergies  Morphine and Ofloxacin  Home Medications   Prior to Admission medications   Medication Sig Start Date End Date Taking? Authorizing Provider  aspirin 81 MG tablet Take 81 mg by mouth daily.      Historical Provider, MD  Cholecalciferol (VITAMIN D) 2000 UNITS CAPS Take 1 capsule by mouth daily.    Historical Provider, MD  clopidogrel (PLAVIX) 75 MG tablet Take 1 tablet (75 mg total) by mouth daily. 08/14/12   Michele McalpineScott M Nadel, MD  donepezil (ARICEPT) 10 MG tablet Take 1 tablet (10 mg total) by mouth at bedtime as needed. 08/14/12 08/14/13  Michele McalpineScott M Nadel, MD  fish oil-omega-3 fatty acids 1000 MG capsule  Take 1 g by mouth daily.      Historical Provider, MD  furosemide (LASIX) 40 MG tablet Take 1 tablet (40 mg total) by mouth daily. 1/2 tablet by mouth daily 11/14/12 11/14/13  Michele Mcalpine, MD  isosorbide mononitrate (IMDUR) 30 MG 24 hr tablet Take 1/2 tablet by mouth once daily 11/14/12   Michele Mcalpine, MD  levothyroxine (SYNTHROID, LEVOTHROID) 75 MCG tablet Take 1 tablet (75 mcg total) by mouth daily. 11/14/12   Michele Mcalpine, MD  losartan (COZAAR) 100 MG tablet Take 1 tablet (100 mg total) by mouth daily. 05/29/12   Michele Mcalpine, MD  memantine (NAMENDA) 10 MG tablet Take 1 tablet (10 mg total) by mouth 2 (two) times daily. 11/14/12 11/14/13  Michele Mcalpine, MD  metoprolol succinate (TOPROL-XL) 100 MG 24 hr tablet Take 100 mg by mouth at bedtime. Take 1/2 tablet by mouth once daily 04/18/12   Michele Mcalpine, MD  Multiple  Vitamins-Minerals (CENTRUM SILVER PO) Take 1 tablet by mouth daily.      Historical Provider, MD  oxyCODONE (OXY IR/ROXICODONE) 5 MG immediate release tablet Take 1-2 tablets (5-10 mg total) by mouth every 4 (four) hours as needed for pain. 11/16/12   Amber Constable, PA-C  polyethylene glycol (MIRALAX / GLYCOLAX) packet Take 17 g by mouth daily as needed. 11/16/12   Amber Constable, PA-C  potassium chloride SA (K-DUR,KLOR-CON) 20 MEQ tablet Take 1 tablet (20 mEq total) by mouth daily. 11/14/12 11/14/13  Michele Mcalpine, MD  simvastatin (ZOCOR) 40 MG tablet Take 1 tablet (40 mg total) by mouth at bedtime. 10/16/11   Michele Mcalpine, MD   BP 122/73 mmHg  Pulse 69  Temp(Src) 99.1 F (37.3 C) (Rectal)  Resp 22  SpO2 100% Physical Exam  Constitutional: She is oriented to person, place, and time. She appears well-developed and well-nourished. No distress.  HENT:  Head: Normocephalic.  Eyes: Conjunctivae are normal.  Neck: Neck supple. No tracheal deviation present.  Cardiovascular: Normal rate, regular rhythm and normal heart sounds.   Pulmonary/Chest: Effort normal and breath sounds normal. No respiratory distress. She has no wheezes. She has no rales.  Abdominal: Soft. She exhibits no distension.  Musculoskeletal:  3+ b/l LE non-pitting edema and mild discoloration, 2 cm clear fluid filled bulla of left medial calf. No erythema, warmth, induration, or decreased ROM  Neurological: She is alert and oriented to person, place, and time. She has normal strength. No cranial nerve deficit. Coordination normal. GCS eye subscore is 4. GCS verbal subscore is 5. GCS motor subscore is 6.  Skin: Skin is warm and dry.  Psychiatric: She has a normal mood and affect.    ED Course  Procedures (including critical care time) Labs Review Labs Reviewed  URINALYSIS, ROUTINE W REFLEX MICROSCOPIC (NOT AT [facility]) - Abnormal; Notable for the following:    Hgb urine dipstick TRACE (*)    Leukocytes, UA SMALL (*)    All  other components within normal limits  COMPREHENSIVE METABOLIC PANEL - Abnormal; Notable for the following:    Glucose, Bld 153 (*)    BUN 31 (*)    Creatinine, Ser 1.55 (*)    Total Protein 6.2 (*)    Albumin 3.1 (*)    AST 43 (*)    GFR calc non Af Amer 30 (*)    GFR calc Af Amer 35 (*)    All other components within normal limits  URINE MICROSCOPIC-ADD ON - Abnormal; Notable for  the following:    Squamous Epithelial / LPF 6-30 (*)    Bacteria, UA FEW (*)    All other components within normal limits  I-STAT CG4 LACTIC ACID, ED - Abnormal; Notable for the following:    Lactic Acid, Venous 2.64 (*)    All other components within normal limits  URINE CULTURE  CBC WITH DIFFERENTIAL/PLATELET    Imaging Review Dg Chest Port 1 View  10/28/2015  CLINICAL DATA:  Confusion, swelling in feet and ankles. EXAM: PORTABLE CHEST 1 VIEW COMPARISON:  03/11/2008. FINDINGS: Low lung volumes with bibasilar opacities likely subsegmental atelectasis although early infiltrates not excluded. No effusion or pneumothorax. Normal cardiac silhouette. Calcified tortuous aorta. No osseous findings. IMPRESSION: Worsening aeration. Low lung volumes for this patient with hyperinflation, and history of asthma, with bibasilar opacities. Electronically Signed   By: Elsie Stain M.D.   On: 10/28/2015 13:37   I have personally reviewed and evaluated these images and lab results as part of my medical decision-making.   EKG Interpretation None      MDM   Final diagnoses:  Confusion  Bilateral lower extremity edema    82 y.o. female presents with Worsening bilateral lower extremity edema and weakness over the last few weeks. The family has a secondary complaint that the patient has been having waxing and waning mental status. Patient does have history of mild dementia. Difficulty obtaining IV access with prolonged tourniquet time prior to blood draw. Mildly elevated lactic acid level likely secondary to this but  has no leukocytosis, no fever or other significant signs of infection. On record review from nursing facility creatinine has trended from 1.4 to 1.7 in the last month and is 1.5 today on reassessment.  Legs appear consistent with chronic venous stasis. No significant hematologic or metabolic abnormalities to explain symptoms. I recommended a short-term increase to twice daily Lasix over the weekend to help the patient diurese some fluid, elevation of the legs and low pressure compression dressings with Tylenol for pain as needed. Urine appears possibly contaminated with small leukocytes, sent for culture to evaluate for occult infection. Daughter inquiring about possibility of head CT to evaluate for confusion. Patient is alert and oriented currently without any neurologic deficits, have very low suspicion for active stroke or bleeding with lack of typical signs or symptoms. I discussed the low yield of a head CT in this clinical setting with the daughter and she agreed to forego this test and return to emergency department with signs of worsening as needed to reconsider.  At this time patient does not appear to need inpatient stabilization and appears stable for discharge to nursing care facility. Plan to follow up with PCP as needed and return precautions discussed for worsening or new concerning symptoms.      Lyndal Pulley, MD 10/28/15 1754

## 2015-10-28 NOTE — ED Notes (Signed)
Per [facility], Patient coming [facility] with increasing bilateral leg weakness for the past two weeks. Pt denies SOB, Chest Pain, or other symptoms. Facility reports increases in confusion. EMS reports Alert and Orient x3, Person, Place, and Situation. Facility reports this is new to the patient to not be oriented to time. Vitals per EMS: 151/94, 73 HR NSR, 97% on RA, 190 CBG,

## 2015-10-28 NOTE — ED Notes (Signed)
Pt's bandages cut off so MD would be able to assess.

## 2015-10-30 DIAGNOSIS — R600 Localized edema: Secondary | ICD-10-CM | POA: Diagnosis not present

## 2015-10-30 DIAGNOSIS — R4182 Altered mental status, unspecified: Secondary | ICD-10-CM | POA: Diagnosis not present

## 2015-10-30 LAB — URINE CULTURE

## 2015-10-31 DIAGNOSIS — K5730 Diverticulosis of large intestine without perforation or abscess without bleeding: Secondary | ICD-10-CM | POA: Diagnosis not present

## 2015-10-31 DIAGNOSIS — J45909 Unspecified asthma, uncomplicated: Secondary | ICD-10-CM | POA: Diagnosis not present

## 2015-10-31 DIAGNOSIS — Z9181 History of falling: Secondary | ICD-10-CM | POA: Diagnosis not present

## 2015-10-31 DIAGNOSIS — R296 Repeated falls: Secondary | ICD-10-CM | POA: Diagnosis not present

## 2015-11-01 DIAGNOSIS — Z79899 Other long term (current) drug therapy: Secondary | ICD-10-CM | POA: Diagnosis not present

## 2015-11-01 DIAGNOSIS — J45909 Unspecified asthma, uncomplicated: Secondary | ICD-10-CM | POA: Diagnosis not present

## 2015-11-01 DIAGNOSIS — Z9181 History of falling: Secondary | ICD-10-CM | POA: Diagnosis not present

## 2015-11-01 DIAGNOSIS — K5730 Diverticulosis of large intestine without perforation or abscess without bleeding: Secondary | ICD-10-CM | POA: Diagnosis not present

## 2015-11-01 DIAGNOSIS — L988 Other specified disorders of the skin and subcutaneous tissue: Secondary | ICD-10-CM | POA: Diagnosis not present

## 2015-11-01 DIAGNOSIS — R296 Repeated falls: Secondary | ICD-10-CM | POA: Diagnosis not present

## 2015-11-02 DIAGNOSIS — R52 Pain, unspecified: Secondary | ICD-10-CM | POA: Diagnosis not present

## 2015-11-02 DIAGNOSIS — R296 Repeated falls: Secondary | ICD-10-CM | POA: Diagnosis not present

## 2015-11-02 DIAGNOSIS — K5730 Diverticulosis of large intestine without perforation or abscess without bleeding: Secondary | ICD-10-CM | POA: Diagnosis not present

## 2015-11-02 DIAGNOSIS — J45909 Unspecified asthma, uncomplicated: Secondary | ICD-10-CM | POA: Diagnosis not present

## 2015-11-02 DIAGNOSIS — Z9181 History of falling: Secondary | ICD-10-CM | POA: Diagnosis not present

## 2015-11-03 DIAGNOSIS — J45909 Unspecified asthma, uncomplicated: Secondary | ICD-10-CM | POA: Diagnosis not present

## 2015-11-03 DIAGNOSIS — R296 Repeated falls: Secondary | ICD-10-CM | POA: Diagnosis not present

## 2015-11-03 DIAGNOSIS — Z9181 History of falling: Secondary | ICD-10-CM | POA: Diagnosis not present

## 2015-11-03 DIAGNOSIS — K5730 Diverticulosis of large intestine without perforation or abscess without bleeding: Secondary | ICD-10-CM | POA: Diagnosis not present

## 2015-11-03 DIAGNOSIS — R0600 Dyspnea, unspecified: Secondary | ICD-10-CM | POA: Diagnosis not present

## 2015-11-04 DIAGNOSIS — Z9181 History of falling: Secondary | ICD-10-CM | POA: Diagnosis not present

## 2015-11-04 DIAGNOSIS — K5730 Diverticulosis of large intestine without perforation or abscess without bleeding: Secondary | ICD-10-CM | POA: Diagnosis not present

## 2015-11-04 DIAGNOSIS — R296 Repeated falls: Secondary | ICD-10-CM | POA: Diagnosis not present

## 2015-11-04 DIAGNOSIS — J45909 Unspecified asthma, uncomplicated: Secondary | ICD-10-CM | POA: Diagnosis not present

## 2015-11-07 DIAGNOSIS — R262 Difficulty in walking, not elsewhere classified: Secondary | ICD-10-CM | POA: Diagnosis not present

## 2015-11-07 DIAGNOSIS — Z9181 History of falling: Secondary | ICD-10-CM | POA: Diagnosis not present

## 2015-11-07 DIAGNOSIS — R296 Repeated falls: Secondary | ICD-10-CM | POA: Diagnosis not present

## 2015-11-07 DIAGNOSIS — M6281 Muscle weakness (generalized): Secondary | ICD-10-CM | POA: Diagnosis not present

## 2015-11-07 DIAGNOSIS — K5730 Diverticulosis of large intestine without perforation or abscess without bleeding: Secondary | ICD-10-CM | POA: Diagnosis not present

## 2015-11-08 DIAGNOSIS — R296 Repeated falls: Secondary | ICD-10-CM | POA: Diagnosis not present

## 2015-11-08 DIAGNOSIS — M6281 Muscle weakness (generalized): Secondary | ICD-10-CM | POA: Diagnosis not present

## 2015-11-08 DIAGNOSIS — K5730 Diverticulosis of large intestine without perforation or abscess without bleeding: Secondary | ICD-10-CM | POA: Diagnosis not present

## 2015-11-08 DIAGNOSIS — L988 Other specified disorders of the skin and subcutaneous tissue: Secondary | ICD-10-CM | POA: Diagnosis not present

## 2015-11-08 DIAGNOSIS — R262 Difficulty in walking, not elsewhere classified: Secondary | ICD-10-CM | POA: Diagnosis not present

## 2015-11-08 DIAGNOSIS — Z9181 History of falling: Secondary | ICD-10-CM | POA: Diagnosis not present

## 2015-11-09 DIAGNOSIS — R296 Repeated falls: Secondary | ICD-10-CM | POA: Diagnosis not present

## 2015-11-09 DIAGNOSIS — I739 Peripheral vascular disease, unspecified: Secondary | ICD-10-CM | POA: Diagnosis not present

## 2015-11-09 DIAGNOSIS — K5730 Diverticulosis of large intestine without perforation or abscess without bleeding: Secondary | ICD-10-CM | POA: Diagnosis not present

## 2015-11-09 DIAGNOSIS — R262 Difficulty in walking, not elsewhere classified: Secondary | ICD-10-CM | POA: Diagnosis not present

## 2015-11-09 DIAGNOSIS — M6281 Muscle weakness (generalized): Secondary | ICD-10-CM | POA: Diagnosis not present

## 2015-11-09 DIAGNOSIS — Z79899 Other long term (current) drug therapy: Secondary | ICD-10-CM | POA: Diagnosis not present

## 2015-11-09 DIAGNOSIS — M79672 Pain in left foot: Secondary | ICD-10-CM | POA: Diagnosis not present

## 2015-11-09 DIAGNOSIS — M79671 Pain in right foot: Secondary | ICD-10-CM | POA: Diagnosis not present

## 2015-11-09 DIAGNOSIS — B351 Tinea unguium: Secondary | ICD-10-CM | POA: Diagnosis not present

## 2015-11-09 DIAGNOSIS — Z9181 History of falling: Secondary | ICD-10-CM | POA: Diagnosis not present

## 2015-11-10 DIAGNOSIS — R296 Repeated falls: Secondary | ICD-10-CM | POA: Diagnosis not present

## 2015-11-10 DIAGNOSIS — M6281 Muscle weakness (generalized): Secondary | ICD-10-CM | POA: Diagnosis not present

## 2015-11-10 DIAGNOSIS — K5730 Diverticulosis of large intestine without perforation or abscess without bleeding: Secondary | ICD-10-CM | POA: Diagnosis not present

## 2015-11-10 DIAGNOSIS — R262 Difficulty in walking, not elsewhere classified: Secondary | ICD-10-CM | POA: Diagnosis not present

## 2015-11-10 DIAGNOSIS — Z9181 History of falling: Secondary | ICD-10-CM | POA: Diagnosis not present

## 2015-11-11 DIAGNOSIS — R296 Repeated falls: Secondary | ICD-10-CM | POA: Diagnosis not present

## 2015-11-11 DIAGNOSIS — Z9181 History of falling: Secondary | ICD-10-CM | POA: Diagnosis not present

## 2015-11-11 DIAGNOSIS — R262 Difficulty in walking, not elsewhere classified: Secondary | ICD-10-CM | POA: Diagnosis not present

## 2015-11-11 DIAGNOSIS — M6281 Muscle weakness (generalized): Secondary | ICD-10-CM | POA: Diagnosis not present

## 2015-11-11 DIAGNOSIS — K5730 Diverticulosis of large intestine without perforation or abscess without bleeding: Secondary | ICD-10-CM | POA: Diagnosis not present

## 2015-11-14 DIAGNOSIS — R262 Difficulty in walking, not elsewhere classified: Secondary | ICD-10-CM | POA: Diagnosis not present

## 2015-11-14 DIAGNOSIS — K5730 Diverticulosis of large intestine without perforation or abscess without bleeding: Secondary | ICD-10-CM | POA: Diagnosis not present

## 2015-11-14 DIAGNOSIS — M6281 Muscle weakness (generalized): Secondary | ICD-10-CM | POA: Diagnosis not present

## 2015-11-14 DIAGNOSIS — R296 Repeated falls: Secondary | ICD-10-CM | POA: Diagnosis not present

## 2015-11-14 DIAGNOSIS — Z9181 History of falling: Secondary | ICD-10-CM | POA: Diagnosis not present

## 2015-11-15 DIAGNOSIS — R296 Repeated falls: Secondary | ICD-10-CM | POA: Diagnosis not present

## 2015-11-15 DIAGNOSIS — K5730 Diverticulosis of large intestine without perforation or abscess without bleeding: Secondary | ICD-10-CM | POA: Diagnosis not present

## 2015-11-15 DIAGNOSIS — R262 Difficulty in walking, not elsewhere classified: Secondary | ICD-10-CM | POA: Diagnosis not present

## 2015-11-15 DIAGNOSIS — Z9181 History of falling: Secondary | ICD-10-CM | POA: Diagnosis not present

## 2015-11-15 DIAGNOSIS — M6281 Muscle weakness (generalized): Secondary | ICD-10-CM | POA: Diagnosis not present

## 2015-11-16 DIAGNOSIS — R0689 Other abnormalities of breathing: Secondary | ICD-10-CM | POA: Diagnosis not present

## 2015-11-16 DIAGNOSIS — L988 Other specified disorders of the skin and subcutaneous tissue: Secondary | ICD-10-CM | POA: Diagnosis not present

## 2015-11-16 DIAGNOSIS — Z79899 Other long term (current) drug therapy: Secondary | ICD-10-CM | POA: Diagnosis not present

## 2015-11-17 DIAGNOSIS — Z9181 History of falling: Secondary | ICD-10-CM | POA: Diagnosis not present

## 2015-11-17 DIAGNOSIS — R296 Repeated falls: Secondary | ICD-10-CM | POA: Diagnosis not present

## 2015-11-17 DIAGNOSIS — L539 Erythematous condition, unspecified: Secondary | ICD-10-CM | POA: Diagnosis not present

## 2015-11-17 DIAGNOSIS — R262 Difficulty in walking, not elsewhere classified: Secondary | ICD-10-CM | POA: Diagnosis not present

## 2015-11-17 DIAGNOSIS — K5730 Diverticulosis of large intestine without perforation or abscess without bleeding: Secondary | ICD-10-CM | POA: Diagnosis not present

## 2015-11-17 DIAGNOSIS — M6281 Muscle weakness (generalized): Secondary | ICD-10-CM | POA: Diagnosis not present

## 2015-11-17 DIAGNOSIS — M7989 Other specified soft tissue disorders: Secondary | ICD-10-CM | POA: Diagnosis not present

## 2015-11-18 DIAGNOSIS — K5730 Diverticulosis of large intestine without perforation or abscess without bleeding: Secondary | ICD-10-CM | POA: Diagnosis not present

## 2015-11-18 DIAGNOSIS — R262 Difficulty in walking, not elsewhere classified: Secondary | ICD-10-CM | POA: Diagnosis not present

## 2015-11-18 DIAGNOSIS — R296 Repeated falls: Secondary | ICD-10-CM | POA: Diagnosis not present

## 2015-11-18 DIAGNOSIS — Z9181 History of falling: Secondary | ICD-10-CM | POA: Diagnosis not present

## 2015-11-18 DIAGNOSIS — M6281 Muscle weakness (generalized): Secondary | ICD-10-CM | POA: Diagnosis not present

## 2015-11-22 DIAGNOSIS — M6281 Muscle weakness (generalized): Secondary | ICD-10-CM | POA: Diagnosis not present

## 2015-11-22 DIAGNOSIS — Z9181 History of falling: Secondary | ICD-10-CM | POA: Diagnosis not present

## 2015-11-22 DIAGNOSIS — R296 Repeated falls: Secondary | ICD-10-CM | POA: Diagnosis not present

## 2015-11-22 DIAGNOSIS — K5730 Diverticulosis of large intestine without perforation or abscess without bleeding: Secondary | ICD-10-CM | POA: Diagnosis not present

## 2015-11-22 DIAGNOSIS — L988 Other specified disorders of the skin and subcutaneous tissue: Secondary | ICD-10-CM | POA: Diagnosis not present

## 2015-11-22 DIAGNOSIS — R262 Difficulty in walking, not elsewhere classified: Secondary | ICD-10-CM | POA: Diagnosis not present

## 2015-11-23 DIAGNOSIS — K5730 Diverticulosis of large intestine without perforation or abscess without bleeding: Secondary | ICD-10-CM | POA: Diagnosis not present

## 2015-11-23 DIAGNOSIS — R262 Difficulty in walking, not elsewhere classified: Secondary | ICD-10-CM | POA: Diagnosis not present

## 2015-11-23 DIAGNOSIS — R296 Repeated falls: Secondary | ICD-10-CM | POA: Diagnosis not present

## 2015-11-23 DIAGNOSIS — Z9181 History of falling: Secondary | ICD-10-CM | POA: Diagnosis not present

## 2015-11-23 DIAGNOSIS — M6281 Muscle weakness (generalized): Secondary | ICD-10-CM | POA: Diagnosis not present

## 2015-11-23 DIAGNOSIS — Z79899 Other long term (current) drug therapy: Secondary | ICD-10-CM | POA: Diagnosis not present

## 2015-11-24 DIAGNOSIS — K5730 Diverticulosis of large intestine without perforation or abscess without bleeding: Secondary | ICD-10-CM | POA: Diagnosis not present

## 2015-11-24 DIAGNOSIS — R262 Difficulty in walking, not elsewhere classified: Secondary | ICD-10-CM | POA: Diagnosis not present

## 2015-11-24 DIAGNOSIS — Z9181 History of falling: Secondary | ICD-10-CM | POA: Diagnosis not present

## 2015-11-24 DIAGNOSIS — R296 Repeated falls: Secondary | ICD-10-CM | POA: Diagnosis not present

## 2015-11-24 DIAGNOSIS — M6281 Muscle weakness (generalized): Secondary | ICD-10-CM | POA: Diagnosis not present

## 2015-11-25 DIAGNOSIS — R296 Repeated falls: Secondary | ICD-10-CM | POA: Diagnosis not present

## 2015-11-25 DIAGNOSIS — Z9181 History of falling: Secondary | ICD-10-CM | POA: Diagnosis not present

## 2015-11-25 DIAGNOSIS — K5730 Diverticulosis of large intestine without perforation or abscess without bleeding: Secondary | ICD-10-CM | POA: Diagnosis not present

## 2015-11-25 DIAGNOSIS — M6281 Muscle weakness (generalized): Secondary | ICD-10-CM | POA: Diagnosis not present

## 2015-11-25 DIAGNOSIS — R262 Difficulty in walking, not elsewhere classified: Secondary | ICD-10-CM | POA: Diagnosis not present

## 2015-11-26 DIAGNOSIS — K5730 Diverticulosis of large intestine without perforation or abscess without bleeding: Secondary | ICD-10-CM | POA: Diagnosis not present

## 2015-11-26 DIAGNOSIS — R296 Repeated falls: Secondary | ICD-10-CM | POA: Diagnosis not present

## 2015-11-26 DIAGNOSIS — R262 Difficulty in walking, not elsewhere classified: Secondary | ICD-10-CM | POA: Diagnosis not present

## 2015-11-26 DIAGNOSIS — M6281 Muscle weakness (generalized): Secondary | ICD-10-CM | POA: Diagnosis not present

## 2015-11-26 DIAGNOSIS — Z9181 History of falling: Secondary | ICD-10-CM | POA: Diagnosis not present

## 2015-11-28 DIAGNOSIS — R262 Difficulty in walking, not elsewhere classified: Secondary | ICD-10-CM | POA: Diagnosis not present

## 2015-11-28 DIAGNOSIS — Z9181 History of falling: Secondary | ICD-10-CM | POA: Diagnosis not present

## 2015-11-28 DIAGNOSIS — K5730 Diverticulosis of large intestine without perforation or abscess without bleeding: Secondary | ICD-10-CM | POA: Diagnosis not present

## 2015-11-28 DIAGNOSIS — M6281 Muscle weakness (generalized): Secondary | ICD-10-CM | POA: Diagnosis not present

## 2015-11-28 DIAGNOSIS — R296 Repeated falls: Secondary | ICD-10-CM | POA: Diagnosis not present

## 2015-11-29 DIAGNOSIS — R262 Difficulty in walking, not elsewhere classified: Secondary | ICD-10-CM | POA: Diagnosis not present

## 2015-11-29 DIAGNOSIS — Z9181 History of falling: Secondary | ICD-10-CM | POA: Diagnosis not present

## 2015-11-29 DIAGNOSIS — K5730 Diverticulosis of large intestine without perforation or abscess without bleeding: Secondary | ICD-10-CM | POA: Diagnosis not present

## 2015-11-29 DIAGNOSIS — R296 Repeated falls: Secondary | ICD-10-CM | POA: Diagnosis not present

## 2015-11-29 DIAGNOSIS — L988 Other specified disorders of the skin and subcutaneous tissue: Secondary | ICD-10-CM | POA: Diagnosis not present

## 2015-11-29 DIAGNOSIS — M6281 Muscle weakness (generalized): Secondary | ICD-10-CM | POA: Diagnosis not present

## 2015-11-30 DIAGNOSIS — R296 Repeated falls: Secondary | ICD-10-CM | POA: Diagnosis not present

## 2015-11-30 DIAGNOSIS — M6281 Muscle weakness (generalized): Secondary | ICD-10-CM | POA: Diagnosis not present

## 2015-11-30 DIAGNOSIS — K5730 Diverticulosis of large intestine without perforation or abscess without bleeding: Secondary | ICD-10-CM | POA: Diagnosis not present

## 2015-11-30 DIAGNOSIS — Z9181 History of falling: Secondary | ICD-10-CM | POA: Diagnosis not present

## 2015-11-30 DIAGNOSIS — R262 Difficulty in walking, not elsewhere classified: Secondary | ICD-10-CM | POA: Diagnosis not present

## 2015-12-01 ENCOUNTER — Encounter: Payer: Self-pay | Admitting: Vascular Surgery

## 2015-12-01 DIAGNOSIS — Z9181 History of falling: Secondary | ICD-10-CM | POA: Diagnosis not present

## 2015-12-01 DIAGNOSIS — K5730 Diverticulosis of large intestine without perforation or abscess without bleeding: Secondary | ICD-10-CM | POA: Diagnosis not present

## 2015-12-01 DIAGNOSIS — M6281 Muscle weakness (generalized): Secondary | ICD-10-CM | POA: Diagnosis not present

## 2015-12-01 DIAGNOSIS — R262 Difficulty in walking, not elsewhere classified: Secondary | ICD-10-CM | POA: Diagnosis not present

## 2015-12-01 DIAGNOSIS — R296 Repeated falls: Secondary | ICD-10-CM | POA: Diagnosis not present

## 2015-12-02 DIAGNOSIS — R262 Difficulty in walking, not elsewhere classified: Secondary | ICD-10-CM | POA: Diagnosis not present

## 2015-12-02 DIAGNOSIS — Z9181 History of falling: Secondary | ICD-10-CM | POA: Diagnosis not present

## 2015-12-02 DIAGNOSIS — R296 Repeated falls: Secondary | ICD-10-CM | POA: Diagnosis not present

## 2015-12-02 DIAGNOSIS — K5730 Diverticulosis of large intestine without perforation or abscess without bleeding: Secondary | ICD-10-CM | POA: Diagnosis not present

## 2015-12-02 DIAGNOSIS — M6281 Muscle weakness (generalized): Secondary | ICD-10-CM | POA: Diagnosis not present

## 2015-12-05 DIAGNOSIS — R296 Repeated falls: Secondary | ICD-10-CM | POA: Diagnosis not present

## 2015-12-05 DIAGNOSIS — R293 Abnormal posture: Secondary | ICD-10-CM | POA: Diagnosis not present

## 2015-12-05 DIAGNOSIS — Z9181 History of falling: Secondary | ICD-10-CM | POA: Diagnosis not present

## 2015-12-05 DIAGNOSIS — J45909 Unspecified asthma, uncomplicated: Secondary | ICD-10-CM | POA: Diagnosis not present

## 2015-12-05 DIAGNOSIS — K5730 Diverticulosis of large intestine without perforation or abscess without bleeding: Secondary | ICD-10-CM | POA: Diagnosis not present

## 2015-12-06 ENCOUNTER — Encounter: Payer: Self-pay | Admitting: Vascular Surgery

## 2015-12-06 ENCOUNTER — Ambulatory Visit (INDEPENDENT_AMBULATORY_CARE_PROVIDER_SITE_OTHER): Payer: Medicare Other | Admitting: Vascular Surgery

## 2015-12-06 VITALS — BP 112/62 | HR 64 | Temp 97.7°F | Resp 14

## 2015-12-06 DIAGNOSIS — I87313 Chronic venous hypertension (idiopathic) with ulcer of bilateral lower extremity: Secondary | ICD-10-CM | POA: Diagnosis not present

## 2015-12-06 DIAGNOSIS — IMO0001 Reserved for inherently not codable concepts without codable children: Secondary | ICD-10-CM

## 2015-12-06 DIAGNOSIS — J45909 Unspecified asthma, uncomplicated: Secondary | ICD-10-CM | POA: Diagnosis not present

## 2015-12-06 DIAGNOSIS — K5730 Diverticulosis of large intestine without perforation or abscess without bleeding: Secondary | ICD-10-CM | POA: Diagnosis not present

## 2015-12-06 DIAGNOSIS — L97929 Non-pressure chronic ulcer of unspecified part of left lower leg with unspecified severity: Principal | ICD-10-CM

## 2015-12-06 DIAGNOSIS — L97919 Non-pressure chronic ulcer of unspecified part of right lower leg with unspecified severity: Principal | ICD-10-CM

## 2015-12-06 DIAGNOSIS — R293 Abnormal posture: Secondary | ICD-10-CM | POA: Diagnosis not present

## 2015-12-06 DIAGNOSIS — Z9181 History of falling: Secondary | ICD-10-CM | POA: Diagnosis not present

## 2015-12-06 DIAGNOSIS — R296 Repeated falls: Secondary | ICD-10-CM | POA: Diagnosis not present

## 2015-12-06 NOTE — Progress Notes (Signed)
[facility]  Patient name: Andrea PughBonnie J Keena MRN: [id number] DOB: 03-14-1932 Sex: female  REASON FOR CONSULT: Bilateral lower extremity ulceration  HPI: Andrea Mccoy is a 82 y.o. female, who is seen today for evaluation of ulceration on both lower extremities. She is very pleasant 82 year old woman who is here today with her daughter. She is resident of a nursing home. She has had overall marked continued to clinical deterioration. She reports that she is unable to even stand to transfer currently. She is a lift from bed to wheelchair. She has a long history of ulceration over her lower [place] reason long history of swelling. She is on significant dosages of Lasix and continues to have some swelling despite this. She reports pain associated with the ulceration and swelling as well. No history of lower extremity arterial insufficiency  Past Medical History  Diagnosis Date  . Allergic rhinitis   . Asthmatic bronchitis   . Hypertension   . Arteriosclerotic heart disease   . Venous insufficiency (chronic) (peripheral)   . Hypercholesteremia   . Hypothyroid   . Nontoxic multinodular goiter   . GERD (gastroesophageal reflux disease)   . Diverticulosis of colon   . Cholelithiasis   . Chronic UTI   . DJD (degenerative joint disease)   . TIA (transient ischemic attack)   . Memory loss   . Intracranial aneurysm   . Anxiety   . Anemia   . Calculus of gallbladder without cholecystitis without obstruction   . Multinodular goiter   . History of falling   . Gait abnormality   . Hyperlipidemia   . Alzheimer's dementia   . Vitamin D deficiency   . Hypokalemia   . Constipation     History reviewed. No pertinent family history.  SOCIAL HISTORY: Social History   Social History  . Marital Status: Widowed    Spouse Name: N/A  . Number of Children: 2  . Years of Education: N/A   Occupational History  . retired    Social History Main Topics  .  Smoking status: Never Smoker   . Smokeless tobacco: Never Used  . Alcohol Use: No  . Drug Use: No  . Sexual Activity: Not on file   Other Topics Concern  . Not on file   Social History Narrative    Allergies  Allergen Reactions  . Morphine     Hallucinations   . Ofloxacin     Unable to remember    Current Outpatient Prescriptions  Medication Sig Dispense Refill  . acetaminophen (TYLENOL) 500 MG tablet Take 2 tablets (1,000 mg total) by mouth every 6 (six) hours as needed for moderate pain. 30 tablet 0  . acetaminophen (TYLENOL) 650 MG CR tablet Take 650 mg by mouth every 4 (four) hours as needed for pain.    Marland Kitchen. albuterol (PROVENTIL) (2.5 MG/3ML) 0.083% nebulizer solution Take 2.5 mg by nebulization 3 (three) times daily as needed for wheezing or shortness of breath.    . ALPRAZolam (XANAX) 0.5 MG tablet Take 0.5 mg by mouth at bedtime.    Marland Kitchen. aspirin 81 MG chewable tablet Chew 81 mg by mouth daily.    . benzonatate (TESSALON) 100 MG capsule Take 100 mg by mouth 3 (three) times daily as needed for cough.    . Cholecalciferol (VITAMIN D) 2000 UNITS CAPS Take 1 capsule by mouth daily.    . clopidogrel (PLAVIX) 75 MG tablet Take 1 tablet (75 mg total) by mouth daily. 90  tablet 3  . fish oil-omega-3 fatty acids 1000 MG capsule Take 1 g by mouth daily.      . furosemide (LASIX) 40 MG tablet Take 0.5 tablets (20 mg total) by mouth daily after lunch. 2 tablet 0  . hydrochlorothiazide (HYDRODIURIL) 25 MG tablet Take 25 mg by mouth every morning.    Marland Kitchen HYDROcodone-acetaminophen (NORCO/VICODIN) 5-325 MG tablet Take 1 tablet by mouth every 6 (six) hours as needed for moderate pain.    . hydroxypropyl methylcellulose / hypromellose (ISOPTO TEARS / GONIOVISC) 2.5 % ophthalmic solution Place 1 drop into both eyes 4 (four) times daily.    . isosorbide mononitrate (IMDUR) 30 MG 24 hr tablet Take 1/2 tablet by mouth once daily 45 tablet 3  . lactulose (CHRONULAC) 10 GM/15ML solution Take 15 g by mouth  daily.    Marland Kitchen levothyroxine (SYNTHROID, LEVOTHROID) 75 MCG tablet Take 1 tablet (75 mcg total) by mouth daily. 90 tablet 3  . memantine (NAMENDA XR) 28 MG CP24 24 hr capsule Take 28 mg by mouth every morning.    . metoprolol succinate (TOPROL-XL) 25 MG 24 hr tablet Take 25 mg by mouth daily.    . Multiple Vitamins-Minerals (CENTRUM SILVER PO) Take 1 tablet by mouth daily.      . pseudoephedrine-dextromethorphan-guaifenesin (ROBITUSSIN-PE) 30-10-100 MG/5ML solution Take 10 mLs by mouth 3 (three) times daily as needed for cough.    . shark liver oil-cocoa butter (PREPARATION H) 0.25-3-85.5 % suppository Place 1 suppository rectally 3 (three) times daily as needed for hemorrhoids.    . simvastatin (ZOCOR) 40 MG tablet Take 1 tablet (40 mg total) by mouth at bedtime. 90 tablet 3  . spironolactone (ALDACTONE) 25 MG tablet Take 25 mg by mouth every Tuesday, Thursday, and Saturday at 6 PM.    . donepezil (ARICEPT) 10 MG tablet Take 1 tablet (10 mg total) by mouth at bedtime as needed. (Patient taking differently: Take 10 mg by mouth at bedtime. ) 90 tablet 3  . furosemide (LASIX) 40 MG tablet Take 1 tablet (40 mg total) by mouth daily. 1/2 tablet by mouth daily (Patient taking differently: Take 40 mg by mouth daily. ) 90 tablet 3  . potassium chloride SA (K-DUR,KLOR-CON) 20 MEQ tablet Take 1 tablet (20 mEq total) by mouth daily. (Patient taking differently: Take 20 mEq by mouth 2 (two) times daily. ) 90 tablet 3   No current facility-administered medications for this visit.    REVIEW OF SYSTEMS:   denotes positive finding,  denotes negative finding Cardiac  Comments:  Chest pain or chest pressure:    Shortness of breath upon exertion:    Short of breath when lying flat:    Irregular heart rhythm:        Vascular    Pain in calf, thigh, or hip brought on by ambulation:    Pain in feet at night that wakes you up from your sleep:     Blood clot in your veins:    Leg swelling:  x         Pulmonary    Oxygen at home:    Productive cough:     Wheezing:         Neurologic    Sudden weakness in arms or legs:     Sudden numbness in arms or legs:     Sudden onset of difficulty speaking or slurred speech:    Temporary loss of vision in one eye:     Problems with dizziness:  Gastrointestinal    Blood in stool:     Vomited blood:         Genitourinary    Burning when urinating:     Blood in urine:        Psychiatric    Major depression:         Hematologic    Bleeding problems:    Problems with blood clotting too easily:        Skin    Rashes or ulcers: x       Constitutional    Fever or chills:      PHYSICAL EXAM: Filed Vitals:   12/06/15 0858  BP: 112/62  Pulse: 64  Temp: 97.7 F (36.5 C)  Resp: 14  SpO2: 96%    GENERAL: The patient is a well-nourished female, in no acute distress. The vital signs are documented above. CARDIAC: There is a regular rate and rhythm.  VASCULAR: 2+ radial and 2+ dorsalis pedis pulses bilaterally PULMONARY: There is good air exchange bilaterally without wheezing or rales. ABDOMEN: Soft and non-tender with normal pitched bowel sounds. Obese MUSCULOSKELETAL: There are no major deformities NEUROLOGIC: No focal weakness or paresthesias are detected. SKIN: Significant swelling and chronic cyanosis bilaterally. Healed ulcerations over the medial aspect of her right ankle. She has open ulcerations over her proximal left calf. No evidence of surrounding erythema PSYCHIATRIC: The patient has a normal affect.  DATA:  Do have the results of outpatient study in her nursing facility from 11/17/2015. This shows no evidence of DVT bilaterally. Her arterial studies reveal triphasic and biphasic waveforms and normal ankle arm index bilaterally  MEDICAL ISSUES: Had long discussion with patient and her daughter present. Expanded she does not have any evidence of arterial insufficiency would put her at risk for limb threatening  ischemia. I did explain the chronic nature of her of venous hypertension. Explain the only treatment option is elevation with her leg above her heart when she is in bed and also graduated compression garments as is being currently done. She is frustrated that no better treatment option. Explained that unfortunately this is not arterial and not limb threatening. She will see [place] again on as-needed basis   Cyler Kappes [facility] [employer]: [phone]

## 2015-12-07 DIAGNOSIS — K5730 Diverticulosis of large intestine without perforation or abscess without bleeding: Secondary | ICD-10-CM | POA: Diagnosis not present

## 2015-12-07 DIAGNOSIS — R296 Repeated falls: Secondary | ICD-10-CM | POA: Diagnosis not present

## 2015-12-07 DIAGNOSIS — Z9181 History of falling: Secondary | ICD-10-CM | POA: Diagnosis not present

## 2015-12-07 DIAGNOSIS — J45909 Unspecified asthma, uncomplicated: Secondary | ICD-10-CM | POA: Diagnosis not present

## 2015-12-07 DIAGNOSIS — R293 Abnormal posture: Secondary | ICD-10-CM | POA: Diagnosis not present

## 2015-12-08 DIAGNOSIS — Z9181 History of falling: Secondary | ICD-10-CM | POA: Diagnosis not present

## 2015-12-08 DIAGNOSIS — R296 Repeated falls: Secondary | ICD-10-CM | POA: Diagnosis not present

## 2015-12-08 DIAGNOSIS — J45909 Unspecified asthma, uncomplicated: Secondary | ICD-10-CM | POA: Diagnosis not present

## 2015-12-08 DIAGNOSIS — R293 Abnormal posture: Secondary | ICD-10-CM | POA: Diagnosis not present

## 2015-12-08 DIAGNOSIS — K5730 Diverticulosis of large intestine without perforation or abscess without bleeding: Secondary | ICD-10-CM | POA: Diagnosis not present

## 2015-12-09 DIAGNOSIS — R296 Repeated falls: Secondary | ICD-10-CM | POA: Diagnosis not present

## 2015-12-09 DIAGNOSIS — J45909 Unspecified asthma, uncomplicated: Secondary | ICD-10-CM | POA: Diagnosis not present

## 2015-12-09 DIAGNOSIS — Z9181 History of falling: Secondary | ICD-10-CM | POA: Diagnosis not present

## 2015-12-09 DIAGNOSIS — K5730 Diverticulosis of large intestine without perforation or abscess without bleeding: Secondary | ICD-10-CM | POA: Diagnosis not present

## 2015-12-09 DIAGNOSIS — R293 Abnormal posture: Secondary | ICD-10-CM | POA: Diagnosis not present

## 2015-12-12 DIAGNOSIS — K5730 Diverticulosis of large intestine without perforation or abscess without bleeding: Secondary | ICD-10-CM | POA: Diagnosis not present

## 2015-12-12 DIAGNOSIS — R293 Abnormal posture: Secondary | ICD-10-CM | POA: Diagnosis not present

## 2015-12-12 DIAGNOSIS — Z9181 History of falling: Secondary | ICD-10-CM | POA: Diagnosis not present

## 2015-12-12 DIAGNOSIS — J45909 Unspecified asthma, uncomplicated: Secondary | ICD-10-CM | POA: Diagnosis not present

## 2015-12-12 DIAGNOSIS — R296 Repeated falls: Secondary | ICD-10-CM | POA: Diagnosis not present

## 2015-12-13 DIAGNOSIS — Z9181 History of falling: Secondary | ICD-10-CM | POA: Diagnosis not present

## 2015-12-13 DIAGNOSIS — R296 Repeated falls: Secondary | ICD-10-CM | POA: Diagnosis not present

## 2015-12-13 DIAGNOSIS — R293 Abnormal posture: Secondary | ICD-10-CM | POA: Diagnosis not present

## 2015-12-13 DIAGNOSIS — K5730 Diverticulosis of large intestine without perforation or abscess without bleeding: Secondary | ICD-10-CM | POA: Diagnosis not present

## 2015-12-13 DIAGNOSIS — J45909 Unspecified asthma, uncomplicated: Secondary | ICD-10-CM | POA: Diagnosis not present

## 2015-12-13 DIAGNOSIS — L988 Other specified disorders of the skin and subcutaneous tissue: Secondary | ICD-10-CM | POA: Diagnosis not present

## 2015-12-14 DIAGNOSIS — Z9181 History of falling: Secondary | ICD-10-CM | POA: Diagnosis not present

## 2015-12-14 DIAGNOSIS — K5730 Diverticulosis of large intestine without perforation or abscess without bleeding: Secondary | ICD-10-CM | POA: Diagnosis not present

## 2015-12-14 DIAGNOSIS — R293 Abnormal posture: Secondary | ICD-10-CM | POA: Diagnosis not present

## 2015-12-14 DIAGNOSIS — R296 Repeated falls: Secondary | ICD-10-CM | POA: Diagnosis not present

## 2015-12-14 DIAGNOSIS — J45909 Unspecified asthma, uncomplicated: Secondary | ICD-10-CM | POA: Diagnosis not present

## 2015-12-15 DIAGNOSIS — R293 Abnormal posture: Secondary | ICD-10-CM | POA: Diagnosis not present

## 2015-12-15 DIAGNOSIS — Z9181 History of falling: Secondary | ICD-10-CM | POA: Diagnosis not present

## 2015-12-15 DIAGNOSIS — K5730 Diverticulosis of large intestine without perforation or abscess without bleeding: Secondary | ICD-10-CM | POA: Diagnosis not present

## 2015-12-15 DIAGNOSIS — J45909 Unspecified asthma, uncomplicated: Secondary | ICD-10-CM | POA: Diagnosis not present

## 2015-12-15 DIAGNOSIS — R296 Repeated falls: Secondary | ICD-10-CM | POA: Diagnosis not present

## 2015-12-16 DIAGNOSIS — J45909 Unspecified asthma, uncomplicated: Secondary | ICD-10-CM | POA: Diagnosis not present

## 2015-12-16 DIAGNOSIS — R296 Repeated falls: Secondary | ICD-10-CM | POA: Diagnosis not present

## 2015-12-16 DIAGNOSIS — K5730 Diverticulosis of large intestine without perforation or abscess without bleeding: Secondary | ICD-10-CM | POA: Diagnosis not present

## 2015-12-16 DIAGNOSIS — Z9181 History of falling: Secondary | ICD-10-CM | POA: Diagnosis not present

## 2015-12-16 DIAGNOSIS — R293 Abnormal posture: Secondary | ICD-10-CM | POA: Diagnosis not present

## 2015-12-18 DIAGNOSIS — N390 Urinary tract infection, site not specified: Secondary | ICD-10-CM | POA: Diagnosis not present

## 2015-12-18 DIAGNOSIS — R600 Localized edema: Secondary | ICD-10-CM | POA: Diagnosis not present

## 2015-12-18 DIAGNOSIS — I10 Essential (primary) hypertension: Secondary | ICD-10-CM | POA: Diagnosis not present

## 2015-12-18 DIAGNOSIS — R410 Disorientation, unspecified: Secondary | ICD-10-CM | POA: Diagnosis not present

## 2015-12-19 DIAGNOSIS — Z9181 History of falling: Secondary | ICD-10-CM | POA: Diagnosis not present

## 2015-12-19 DIAGNOSIS — R293 Abnormal posture: Secondary | ICD-10-CM | POA: Diagnosis not present

## 2015-12-19 DIAGNOSIS — K5730 Diverticulosis of large intestine without perforation or abscess without bleeding: Secondary | ICD-10-CM | POA: Diagnosis not present

## 2015-12-19 DIAGNOSIS — R296 Repeated falls: Secondary | ICD-10-CM | POA: Diagnosis not present

## 2015-12-19 DIAGNOSIS — J45909 Unspecified asthma, uncomplicated: Secondary | ICD-10-CM | POA: Diagnosis not present

## 2015-12-20 DIAGNOSIS — K5730 Diverticulosis of large intestine without perforation or abscess without bleeding: Secondary | ICD-10-CM | POA: Diagnosis not present

## 2015-12-20 DIAGNOSIS — Z9181 History of falling: Secondary | ICD-10-CM | POA: Diagnosis not present

## 2015-12-20 DIAGNOSIS — J45909 Unspecified asthma, uncomplicated: Secondary | ICD-10-CM | POA: Diagnosis not present

## 2015-12-20 DIAGNOSIS — R293 Abnormal posture: Secondary | ICD-10-CM | POA: Diagnosis not present

## 2015-12-20 DIAGNOSIS — L988 Other specified disorders of the skin and subcutaneous tissue: Secondary | ICD-10-CM | POA: Diagnosis not present

## 2015-12-20 DIAGNOSIS — R296 Repeated falls: Secondary | ICD-10-CM | POA: Diagnosis not present

## 2015-12-21 DIAGNOSIS — R296 Repeated falls: Secondary | ICD-10-CM | POA: Diagnosis not present

## 2015-12-21 DIAGNOSIS — Z9181 History of falling: Secondary | ICD-10-CM | POA: Diagnosis not present

## 2015-12-21 DIAGNOSIS — R293 Abnormal posture: Secondary | ICD-10-CM | POA: Diagnosis not present

## 2015-12-21 DIAGNOSIS — K5730 Diverticulosis of large intestine without perforation or abscess without bleeding: Secondary | ICD-10-CM | POA: Diagnosis not present

## 2015-12-21 DIAGNOSIS — J45909 Unspecified asthma, uncomplicated: Secondary | ICD-10-CM | POA: Diagnosis not present

## 2015-12-22 DIAGNOSIS — K5730 Diverticulosis of large intestine without perforation or abscess without bleeding: Secondary | ICD-10-CM | POA: Diagnosis not present

## 2015-12-22 DIAGNOSIS — R293 Abnormal posture: Secondary | ICD-10-CM | POA: Diagnosis not present

## 2015-12-22 DIAGNOSIS — Z9181 History of falling: Secondary | ICD-10-CM | POA: Diagnosis not present

## 2015-12-22 DIAGNOSIS — J45909 Unspecified asthma, uncomplicated: Secondary | ICD-10-CM | POA: Diagnosis not present

## 2015-12-22 DIAGNOSIS — R296 Repeated falls: Secondary | ICD-10-CM | POA: Diagnosis not present

## 2015-12-23 DIAGNOSIS — J45909 Unspecified asthma, uncomplicated: Secondary | ICD-10-CM | POA: Diagnosis not present

## 2015-12-23 DIAGNOSIS — R293 Abnormal posture: Secondary | ICD-10-CM | POA: Diagnosis not present

## 2015-12-23 DIAGNOSIS — K5730 Diverticulosis of large intestine without perforation or abscess without bleeding: Secondary | ICD-10-CM | POA: Diagnosis not present

## 2015-12-23 DIAGNOSIS — Z9181 History of falling: Secondary | ICD-10-CM | POA: Diagnosis not present

## 2015-12-23 DIAGNOSIS — R296 Repeated falls: Secondary | ICD-10-CM | POA: Diagnosis not present

## 2015-12-26 DIAGNOSIS — K5730 Diverticulosis of large intestine without perforation or abscess without bleeding: Secondary | ICD-10-CM | POA: Diagnosis not present

## 2015-12-26 DIAGNOSIS — Z9181 History of falling: Secondary | ICD-10-CM | POA: Diagnosis not present

## 2015-12-26 DIAGNOSIS — R293 Abnormal posture: Secondary | ICD-10-CM | POA: Diagnosis not present

## 2015-12-26 DIAGNOSIS — R296 Repeated falls: Secondary | ICD-10-CM | POA: Diagnosis not present

## 2015-12-26 DIAGNOSIS — J45909 Unspecified asthma, uncomplicated: Secondary | ICD-10-CM | POA: Diagnosis not present

## 2015-12-27 DIAGNOSIS — L988 Other specified disorders of the skin and subcutaneous tissue: Secondary | ICD-10-CM | POA: Diagnosis not present

## 2015-12-27 DIAGNOSIS — Z9181 History of falling: Secondary | ICD-10-CM | POA: Diagnosis not present

## 2015-12-27 DIAGNOSIS — R296 Repeated falls: Secondary | ICD-10-CM | POA: Diagnosis not present

## 2015-12-27 DIAGNOSIS — R293 Abnormal posture: Secondary | ICD-10-CM | POA: Diagnosis not present

## 2015-12-27 DIAGNOSIS — K5730 Diverticulosis of large intestine without perforation or abscess without bleeding: Secondary | ICD-10-CM | POA: Diagnosis not present

## 2015-12-27 DIAGNOSIS — J45909 Unspecified asthma, uncomplicated: Secondary | ICD-10-CM | POA: Diagnosis not present

## 2015-12-28 DIAGNOSIS — Z9181 History of falling: Secondary | ICD-10-CM | POA: Diagnosis not present

## 2015-12-28 DIAGNOSIS — K5730 Diverticulosis of large intestine without perforation or abscess without bleeding: Secondary | ICD-10-CM | POA: Diagnosis not present

## 2015-12-28 DIAGNOSIS — R296 Repeated falls: Secondary | ICD-10-CM | POA: Diagnosis not present

## 2015-12-28 DIAGNOSIS — J45909 Unspecified asthma, uncomplicated: Secondary | ICD-10-CM | POA: Diagnosis not present

## 2015-12-28 DIAGNOSIS — R293 Abnormal posture: Secondary | ICD-10-CM | POA: Diagnosis not present

## 2015-12-29 DIAGNOSIS — K5730 Diverticulosis of large intestine without perforation or abscess without bleeding: Secondary | ICD-10-CM | POA: Diagnosis not present

## 2015-12-29 DIAGNOSIS — J45909 Unspecified asthma, uncomplicated: Secondary | ICD-10-CM | POA: Diagnosis not present

## 2015-12-29 DIAGNOSIS — Z79899 Other long term (current) drug therapy: Secondary | ICD-10-CM | POA: Diagnosis not present

## 2015-12-29 DIAGNOSIS — R293 Abnormal posture: Secondary | ICD-10-CM | POA: Diagnosis not present

## 2015-12-29 DIAGNOSIS — Z9181 History of falling: Secondary | ICD-10-CM | POA: Diagnosis not present

## 2015-12-29 DIAGNOSIS — R296 Repeated falls: Secondary | ICD-10-CM | POA: Diagnosis not present

## 2015-12-30 DIAGNOSIS — Z9181 History of falling: Secondary | ICD-10-CM | POA: Diagnosis not present

## 2015-12-30 DIAGNOSIS — R293 Abnormal posture: Secondary | ICD-10-CM | POA: Diagnosis not present

## 2015-12-30 DIAGNOSIS — J45909 Unspecified asthma, uncomplicated: Secondary | ICD-10-CM | POA: Diagnosis not present

## 2015-12-30 DIAGNOSIS — K5730 Diverticulosis of large intestine without perforation or abscess without bleeding: Secondary | ICD-10-CM | POA: Diagnosis not present

## 2015-12-30 DIAGNOSIS — R296 Repeated falls: Secondary | ICD-10-CM | POA: Diagnosis not present

## 2016-01-02 DIAGNOSIS — J45909 Unspecified asthma, uncomplicated: Secondary | ICD-10-CM | POA: Diagnosis not present

## 2016-01-02 DIAGNOSIS — K5730 Diverticulosis of large intestine without perforation or abscess without bleeding: Secondary | ICD-10-CM | POA: Diagnosis not present

## 2016-01-02 DIAGNOSIS — Z9181 History of falling: Secondary | ICD-10-CM | POA: Diagnosis not present

## 2016-01-02 DIAGNOSIS — R293 Abnormal posture: Secondary | ICD-10-CM | POA: Diagnosis not present

## 2016-01-02 DIAGNOSIS — R296 Repeated falls: Secondary | ICD-10-CM | POA: Diagnosis not present

## 2016-01-03 DIAGNOSIS — J45909 Unspecified asthma, uncomplicated: Secondary | ICD-10-CM | POA: Diagnosis not present

## 2016-01-03 DIAGNOSIS — L988 Other specified disorders of the skin and subcutaneous tissue: Secondary | ICD-10-CM | POA: Diagnosis not present

## 2016-01-03 DIAGNOSIS — R293 Abnormal posture: Secondary | ICD-10-CM | POA: Diagnosis not present

## 2016-01-03 DIAGNOSIS — K5730 Diverticulosis of large intestine without perforation or abscess without bleeding: Secondary | ICD-10-CM | POA: Diagnosis not present

## 2016-01-03 DIAGNOSIS — R296 Repeated falls: Secondary | ICD-10-CM | POA: Diagnosis not present

## 2016-01-03 DIAGNOSIS — Z9181 History of falling: Secondary | ICD-10-CM | POA: Diagnosis not present

## 2016-01-04 DIAGNOSIS — Z9181 History of falling: Secondary | ICD-10-CM | POA: Diagnosis not present

## 2016-01-04 DIAGNOSIS — J45909 Unspecified asthma, uncomplicated: Secondary | ICD-10-CM | POA: Diagnosis not present

## 2016-01-04 DIAGNOSIS — R296 Repeated falls: Secondary | ICD-10-CM | POA: Diagnosis not present

## 2016-01-04 DIAGNOSIS — K5730 Diverticulosis of large intestine without perforation or abscess without bleeding: Secondary | ICD-10-CM | POA: Diagnosis not present

## 2016-01-04 DIAGNOSIS — R293 Abnormal posture: Secondary | ICD-10-CM | POA: Diagnosis not present

## 2016-01-05 DIAGNOSIS — J45909 Unspecified asthma, uncomplicated: Secondary | ICD-10-CM | POA: Diagnosis not present

## 2016-01-05 DIAGNOSIS — R296 Repeated falls: Secondary | ICD-10-CM | POA: Diagnosis not present

## 2016-01-05 DIAGNOSIS — K5730 Diverticulosis of large intestine without perforation or abscess without bleeding: Secondary | ICD-10-CM | POA: Diagnosis not present

## 2016-01-05 DIAGNOSIS — R293 Abnormal posture: Secondary | ICD-10-CM | POA: Diagnosis not present

## 2016-01-05 DIAGNOSIS — Z9181 History of falling: Secondary | ICD-10-CM | POA: Diagnosis not present

## 2016-01-06 DIAGNOSIS — R296 Repeated falls: Secondary | ICD-10-CM | POA: Diagnosis not present

## 2016-01-06 DIAGNOSIS — R293 Abnormal posture: Secondary | ICD-10-CM | POA: Diagnosis not present

## 2016-01-06 DIAGNOSIS — Z9181 History of falling: Secondary | ICD-10-CM | POA: Diagnosis not present

## 2016-01-06 DIAGNOSIS — K5730 Diverticulosis of large intestine without perforation or abscess without bleeding: Secondary | ICD-10-CM | POA: Diagnosis not present

## 2016-01-06 DIAGNOSIS — J45909 Unspecified asthma, uncomplicated: Secondary | ICD-10-CM | POA: Diagnosis not present

## 2016-01-08 DIAGNOSIS — R600 Localized edema: Secondary | ICD-10-CM | POA: Diagnosis not present

## 2016-01-08 DIAGNOSIS — I10 Essential (primary) hypertension: Secondary | ICD-10-CM | POA: Diagnosis not present

## 2016-01-08 DIAGNOSIS — R410 Disorientation, unspecified: Secondary | ICD-10-CM | POA: Diagnosis not present

## 2016-01-09 DIAGNOSIS — R293 Abnormal posture: Secondary | ICD-10-CM | POA: Diagnosis not present

## 2016-01-09 DIAGNOSIS — J45909 Unspecified asthma, uncomplicated: Secondary | ICD-10-CM | POA: Diagnosis not present

## 2016-01-09 DIAGNOSIS — Z9181 History of falling: Secondary | ICD-10-CM | POA: Diagnosis not present

## 2016-01-09 DIAGNOSIS — R296 Repeated falls: Secondary | ICD-10-CM | POA: Diagnosis not present

## 2016-01-09 DIAGNOSIS — K5730 Diverticulosis of large intestine without perforation or abscess without bleeding: Secondary | ICD-10-CM | POA: Diagnosis not present

## 2016-01-10 DIAGNOSIS — R293 Abnormal posture: Secondary | ICD-10-CM | POA: Diagnosis not present

## 2016-01-10 DIAGNOSIS — J45909 Unspecified asthma, uncomplicated: Secondary | ICD-10-CM | POA: Diagnosis not present

## 2016-01-10 DIAGNOSIS — N390 Urinary tract infection, site not specified: Secondary | ICD-10-CM | POA: Diagnosis not present

## 2016-01-10 DIAGNOSIS — Z9181 History of falling: Secondary | ICD-10-CM | POA: Diagnosis not present

## 2016-01-10 DIAGNOSIS — K5730 Diverticulosis of large intestine without perforation or abscess without bleeding: Secondary | ICD-10-CM | POA: Diagnosis not present

## 2016-01-10 DIAGNOSIS — R296 Repeated falls: Secondary | ICD-10-CM | POA: Diagnosis not present

## 2016-01-11 DIAGNOSIS — K5730 Diverticulosis of large intestine without perforation or abscess without bleeding: Secondary | ICD-10-CM | POA: Diagnosis not present

## 2016-01-11 DIAGNOSIS — J45909 Unspecified asthma, uncomplicated: Secondary | ICD-10-CM | POA: Diagnosis not present

## 2016-01-11 DIAGNOSIS — R296 Repeated falls: Secondary | ICD-10-CM | POA: Diagnosis not present

## 2016-01-11 DIAGNOSIS — J309 Allergic rhinitis, unspecified: Secondary | ICD-10-CM | POA: Diagnosis not present

## 2016-01-11 DIAGNOSIS — R293 Abnormal posture: Secondary | ICD-10-CM | POA: Diagnosis not present

## 2016-01-11 DIAGNOSIS — I10 Essential (primary) hypertension: Secondary | ICD-10-CM | POA: Diagnosis not present

## 2016-01-11 DIAGNOSIS — Z9181 History of falling: Secondary | ICD-10-CM | POA: Diagnosis not present

## 2016-01-12 DIAGNOSIS — K5730 Diverticulosis of large intestine without perforation or abscess without bleeding: Secondary | ICD-10-CM | POA: Diagnosis not present

## 2016-01-12 DIAGNOSIS — R296 Repeated falls: Secondary | ICD-10-CM | POA: Diagnosis not present

## 2016-01-12 DIAGNOSIS — Z9181 History of falling: Secondary | ICD-10-CM | POA: Diagnosis not present

## 2016-01-12 DIAGNOSIS — R293 Abnormal posture: Secondary | ICD-10-CM | POA: Diagnosis not present

## 2016-01-12 DIAGNOSIS — J45909 Unspecified asthma, uncomplicated: Secondary | ICD-10-CM | POA: Diagnosis not present

## 2016-01-15 DIAGNOSIS — R482 Apraxia: Secondary | ICD-10-CM | POA: Diagnosis not present

## 2016-01-15 DIAGNOSIS — E1129 Type 2 diabetes mellitus with other diabetic kidney complication: Secondary | ICD-10-CM | POA: Diagnosis not present

## 2016-01-15 DIAGNOSIS — R8271 Bacteriuria: Secondary | ICD-10-CM | POA: Diagnosis not present

## 2016-01-16 DIAGNOSIS — I10 Essential (primary) hypertension: Secondary | ICD-10-CM | POA: Diagnosis not present

## 2016-01-16 DIAGNOSIS — R296 Repeated falls: Secondary | ICD-10-CM | POA: Diagnosis not present

## 2016-01-16 DIAGNOSIS — Z79899 Other long term (current) drug therapy: Secondary | ICD-10-CM | POA: Diagnosis not present

## 2016-01-16 DIAGNOSIS — Z9181 History of falling: Secondary | ICD-10-CM | POA: Diagnosis not present

## 2016-01-16 DIAGNOSIS — E559 Vitamin D deficiency, unspecified: Secondary | ICD-10-CM | POA: Diagnosis not present

## 2016-01-16 DIAGNOSIS — J45909 Unspecified asthma, uncomplicated: Secondary | ICD-10-CM | POA: Diagnosis not present

## 2016-01-16 DIAGNOSIS — K5730 Diverticulosis of large intestine without perforation or abscess without bleeding: Secondary | ICD-10-CM | POA: Diagnosis not present

## 2016-01-16 DIAGNOSIS — R293 Abnormal posture: Secondary | ICD-10-CM | POA: Diagnosis not present

## 2016-01-17 ENCOUNTER — Other Ambulatory Visit (HOSPITAL_COMMUNITY): Payer: Self-pay | Admitting: Internal Medicine

## 2016-01-17 DIAGNOSIS — Z9181 History of falling: Secondary | ICD-10-CM | POA: Diagnosis not present

## 2016-01-17 DIAGNOSIS — K5730 Diverticulosis of large intestine without perforation or abscess without bleeding: Secondary | ICD-10-CM | POA: Diagnosis not present

## 2016-01-17 DIAGNOSIS — R296 Repeated falls: Secondary | ICD-10-CM | POA: Diagnosis not present

## 2016-01-17 DIAGNOSIS — R293 Abnormal posture: Secondary | ICD-10-CM | POA: Diagnosis not present

## 2016-01-17 DIAGNOSIS — R0989 Other specified symptoms and signs involving the circulatory and respiratory systems: Secondary | ICD-10-CM | POA: Diagnosis not present

## 2016-01-17 DIAGNOSIS — J45909 Unspecified asthma, uncomplicated: Secondary | ICD-10-CM | POA: Diagnosis not present

## 2016-01-18 ENCOUNTER — Other Ambulatory Visit (HOSPITAL_COMMUNITY): Payer: Self-pay | Admitting: Internal Medicine

## 2016-01-18 DIAGNOSIS — I639 Cerebral infarction, unspecified: Secondary | ICD-10-CM

## 2016-01-18 DIAGNOSIS — Z9181 History of falling: Secondary | ICD-10-CM | POA: Diagnosis not present

## 2016-01-18 DIAGNOSIS — J45909 Unspecified asthma, uncomplicated: Secondary | ICD-10-CM | POA: Diagnosis not present

## 2016-01-18 DIAGNOSIS — I69390 Apraxia following cerebral infarction: Secondary | ICD-10-CM

## 2016-01-18 DIAGNOSIS — K5730 Diverticulosis of large intestine without perforation or abscess without bleeding: Secondary | ICD-10-CM | POA: Diagnosis not present

## 2016-01-18 DIAGNOSIS — R296 Repeated falls: Secondary | ICD-10-CM | POA: Diagnosis not present

## 2016-01-18 DIAGNOSIS — R293 Abnormal posture: Secondary | ICD-10-CM | POA: Diagnosis not present

## 2016-01-19 ENCOUNTER — Ambulatory Visit (HOSPITAL_COMMUNITY)
Admission: RE | Admit: 2016-01-19 | Discharge: 2016-01-19 | Disposition: A | Discharge status: Home / Self Care | Payer: Medicare Other | Source: Ambulatory Visit | Attending: Internal Medicine | Admitting: Internal Medicine

## 2016-01-19 DIAGNOSIS — R9089 Other abnormal findings on diagnostic imaging of central nervous system: Secondary | ICD-10-CM | POA: Insufficient documentation

## 2016-01-19 DIAGNOSIS — R410 Disorientation, unspecified: Secondary | ICD-10-CM | POA: Diagnosis not present

## 2016-01-19 DIAGNOSIS — I639 Cerebral infarction, unspecified: Secondary | ICD-10-CM | POA: Diagnosis not present

## 2016-01-19 DIAGNOSIS — I69390 Apraxia following cerebral infarction: Secondary | ICD-10-CM | POA: Insufficient documentation

## 2016-01-23 DIAGNOSIS — K5730 Diverticulosis of large intestine without perforation or abscess without bleeding: Secondary | ICD-10-CM | POA: Diagnosis not present

## 2016-01-23 DIAGNOSIS — J45909 Unspecified asthma, uncomplicated: Secondary | ICD-10-CM | POA: Diagnosis not present

## 2016-01-23 DIAGNOSIS — R293 Abnormal posture: Secondary | ICD-10-CM | POA: Diagnosis not present

## 2016-01-23 DIAGNOSIS — R296 Repeated falls: Secondary | ICD-10-CM | POA: Diagnosis not present

## 2016-01-23 DIAGNOSIS — Z9181 History of falling: Secondary | ICD-10-CM | POA: Diagnosis not present

## 2016-01-24 DIAGNOSIS — R293 Abnormal posture: Secondary | ICD-10-CM | POA: Diagnosis not present

## 2016-01-24 DIAGNOSIS — Z9181 History of falling: Secondary | ICD-10-CM | POA: Diagnosis not present

## 2016-01-24 DIAGNOSIS — K5730 Diverticulosis of large intestine without perforation or abscess without bleeding: Secondary | ICD-10-CM | POA: Diagnosis not present

## 2016-01-24 DIAGNOSIS — J45909 Unspecified asthma, uncomplicated: Secondary | ICD-10-CM | POA: Diagnosis not present

## 2016-01-24 DIAGNOSIS — R296 Repeated falls: Secondary | ICD-10-CM | POA: Diagnosis not present

## 2016-01-25 DIAGNOSIS — J45909 Unspecified asthma, uncomplicated: Secondary | ICD-10-CM | POA: Diagnosis not present

## 2016-01-25 DIAGNOSIS — K5730 Diverticulosis of large intestine without perforation or abscess without bleeding: Secondary | ICD-10-CM | POA: Diagnosis not present

## 2016-01-25 DIAGNOSIS — Z9181 History of falling: Secondary | ICD-10-CM | POA: Diagnosis not present

## 2016-01-25 DIAGNOSIS — R293 Abnormal posture: Secondary | ICD-10-CM | POA: Diagnosis not present

## 2016-01-25 DIAGNOSIS — R296 Repeated falls: Secondary | ICD-10-CM | POA: Diagnosis not present

## 2016-02-01 DIAGNOSIS — J45909 Unspecified asthma, uncomplicated: Secondary | ICD-10-CM | POA: Diagnosis not present

## 2016-02-01 DIAGNOSIS — R293 Abnormal posture: Secondary | ICD-10-CM | POA: Diagnosis not present

## 2016-02-01 DIAGNOSIS — K5730 Diverticulosis of large intestine without perforation or abscess without bleeding: Secondary | ICD-10-CM | POA: Diagnosis not present

## 2016-02-01 DIAGNOSIS — R296 Repeated falls: Secondary | ICD-10-CM | POA: Diagnosis not present

## 2016-02-01 DIAGNOSIS — Z9181 History of falling: Secondary | ICD-10-CM | POA: Diagnosis not present

## 2016-02-02 DIAGNOSIS — J45909 Unspecified asthma, uncomplicated: Secondary | ICD-10-CM | POA: Diagnosis not present

## 2016-02-02 DIAGNOSIS — R293 Abnormal posture: Secondary | ICD-10-CM | POA: Diagnosis not present

## 2016-02-02 DIAGNOSIS — R296 Repeated falls: Secondary | ICD-10-CM | POA: Diagnosis not present

## 2016-02-02 DIAGNOSIS — K5730 Diverticulosis of large intestine without perforation or abscess without bleeding: Secondary | ICD-10-CM | POA: Diagnosis not present

## 2016-02-02 DIAGNOSIS — Z9181 History of falling: Secondary | ICD-10-CM | POA: Diagnosis not present

## 2016-02-03 DIAGNOSIS — R296 Repeated falls: Secondary | ICD-10-CM | POA: Diagnosis not present

## 2016-02-03 DIAGNOSIS — R293 Abnormal posture: Secondary | ICD-10-CM | POA: Diagnosis not present

## 2016-02-03 DIAGNOSIS — J45909 Unspecified asthma, uncomplicated: Secondary | ICD-10-CM | POA: Diagnosis not present

## 2016-02-03 DIAGNOSIS — K5730 Diverticulosis of large intestine without perforation or abscess without bleeding: Secondary | ICD-10-CM | POA: Diagnosis not present

## 2016-02-03 DIAGNOSIS — Z9181 History of falling: Secondary | ICD-10-CM | POA: Diagnosis not present

## 2016-02-06 DIAGNOSIS — R293 Abnormal posture: Secondary | ICD-10-CM | POA: Diagnosis not present

## 2016-02-06 DIAGNOSIS — Z9181 History of falling: Secondary | ICD-10-CM | POA: Diagnosis not present

## 2016-02-07 DIAGNOSIS — Z9181 History of falling: Secondary | ICD-10-CM | POA: Diagnosis not present

## 2016-02-07 DIAGNOSIS — R293 Abnormal posture: Secondary | ICD-10-CM | POA: Diagnosis not present

## 2016-02-13 DIAGNOSIS — R293 Abnormal posture: Secondary | ICD-10-CM | POA: Diagnosis not present

## 2016-02-14 DIAGNOSIS — R293 Abnormal posture: Secondary | ICD-10-CM | POA: Diagnosis not present

## 2016-02-15 DIAGNOSIS — R293 Abnormal posture: Secondary | ICD-10-CM | POA: Diagnosis not present

## 2016-02-16 DIAGNOSIS — R293 Abnormal posture: Secondary | ICD-10-CM | POA: Diagnosis not present

## 2016-02-20 DIAGNOSIS — R293 Abnormal posture: Secondary | ICD-10-CM | POA: Diagnosis not present

## 2016-02-22 DIAGNOSIS — R293 Abnormal posture: Secondary | ICD-10-CM | POA: Diagnosis not present

## 2016-02-23 DIAGNOSIS — R293 Abnormal posture: Secondary | ICD-10-CM | POA: Diagnosis not present

## 2016-02-24 DIAGNOSIS — R293 Abnormal posture: Secondary | ICD-10-CM | POA: Diagnosis not present

## 2016-02-29 DIAGNOSIS — M79671 Pain in right foot: Secondary | ICD-10-CM | POA: Diagnosis not present

## 2016-02-29 DIAGNOSIS — B351 Tinea unguium: Secondary | ICD-10-CM | POA: Diagnosis not present

## 2016-02-29 DIAGNOSIS — I739 Peripheral vascular disease, unspecified: Secondary | ICD-10-CM | POA: Diagnosis not present

## 2016-02-29 DIAGNOSIS — M79672 Pain in left foot: Secondary | ICD-10-CM | POA: Diagnosis not present

## 2016-03-21 DIAGNOSIS — Z79899 Other long term (current) drug therapy: Secondary | ICD-10-CM | POA: Diagnosis not present

## 2016-03-21 DIAGNOSIS — N390 Urinary tract infection, site not specified: Secondary | ICD-10-CM | POA: Diagnosis not present

## 2016-03-23 DIAGNOSIS — M1990 Unspecified osteoarthritis, unspecified site: Secondary | ICD-10-CM | POA: Diagnosis not present

## 2016-03-23 DIAGNOSIS — Z9181 History of falling: Secondary | ICD-10-CM | POA: Diagnosis not present

## 2016-03-29 DIAGNOSIS — M1990 Unspecified osteoarthritis, unspecified site: Secondary | ICD-10-CM | POA: Diagnosis not present

## 2016-03-29 DIAGNOSIS — Z9181 History of falling: Secondary | ICD-10-CM | POA: Diagnosis not present

## 2016-04-02 DIAGNOSIS — M1990 Unspecified osteoarthritis, unspecified site: Secondary | ICD-10-CM | POA: Diagnosis not present

## 2016-04-02 DIAGNOSIS — Z9181 History of falling: Secondary | ICD-10-CM | POA: Diagnosis not present

## 2016-04-03 DIAGNOSIS — Z9181 History of falling: Secondary | ICD-10-CM | POA: Diagnosis not present

## 2016-04-03 DIAGNOSIS — M1990 Unspecified osteoarthritis, unspecified site: Secondary | ICD-10-CM | POA: Diagnosis not present

## 2016-04-05 DIAGNOSIS — E119 Type 2 diabetes mellitus without complications: Secondary | ICD-10-CM | POA: Diagnosis not present

## 2016-04-05 DIAGNOSIS — H04123 Dry eye syndrome of bilateral lacrimal glands: Secondary | ICD-10-CM | POA: Diagnosis not present

## 2016-04-05 DIAGNOSIS — Z961 Presence of intraocular lens: Secondary | ICD-10-CM | POA: Diagnosis not present

## 2016-04-06 DIAGNOSIS — E119 Type 2 diabetes mellitus without complications: Secondary | ICD-10-CM | POA: Diagnosis not present

## 2016-04-27 DIAGNOSIS — N390 Urinary tract infection, site not specified: Secondary | ICD-10-CM | POA: Diagnosis not present

## 2016-05-06 DIAGNOSIS — R2689 Other abnormalities of gait and mobility: Secondary | ICD-10-CM | POA: Diagnosis not present

## 2016-05-06 DIAGNOSIS — E119 Type 2 diabetes mellitus without complications: Secondary | ICD-10-CM | POA: Diagnosis not present

## 2016-05-06 DIAGNOSIS — I10 Essential (primary) hypertension: Secondary | ICD-10-CM | POA: Diagnosis not present

## 2016-05-06 DIAGNOSIS — I872 Venous insufficiency (chronic) (peripheral): Secondary | ICD-10-CM | POA: Diagnosis not present

## 2016-05-07 DIAGNOSIS — Z23 Encounter for immunization: Secondary | ICD-10-CM | POA: Diagnosis not present

## 2016-05-10 DIAGNOSIS — R269 Unspecified abnormalities of gait and mobility: Secondary | ICD-10-CM | POA: Diagnosis not present

## 2016-05-10 DIAGNOSIS — M6281 Muscle weakness (generalized): Secondary | ICD-10-CM | POA: Diagnosis not present

## 2016-05-10 DIAGNOSIS — R262 Difficulty in walking, not elsewhere classified: Secondary | ICD-10-CM | POA: Diagnosis not present

## 2016-05-12 DIAGNOSIS — M6281 Muscle weakness (generalized): Secondary | ICD-10-CM | POA: Diagnosis not present

## 2016-05-12 DIAGNOSIS — R262 Difficulty in walking, not elsewhere classified: Secondary | ICD-10-CM | POA: Diagnosis not present

## 2016-05-12 DIAGNOSIS — R269 Unspecified abnormalities of gait and mobility: Secondary | ICD-10-CM | POA: Diagnosis not present

## 2016-05-14 DIAGNOSIS — R262 Difficulty in walking, not elsewhere classified: Secondary | ICD-10-CM | POA: Diagnosis not present

## 2016-05-14 DIAGNOSIS — M6281 Muscle weakness (generalized): Secondary | ICD-10-CM | POA: Diagnosis not present

## 2016-05-14 DIAGNOSIS — R269 Unspecified abnormalities of gait and mobility: Secondary | ICD-10-CM | POA: Diagnosis not present

## 2016-05-15 DIAGNOSIS — R262 Difficulty in walking, not elsewhere classified: Secondary | ICD-10-CM | POA: Diagnosis not present

## 2016-05-15 DIAGNOSIS — R269 Unspecified abnormalities of gait and mobility: Secondary | ICD-10-CM | POA: Diagnosis not present

## 2016-05-15 DIAGNOSIS — M6281 Muscle weakness (generalized): Secondary | ICD-10-CM | POA: Diagnosis not present

## 2016-05-21 DIAGNOSIS — E119 Type 2 diabetes mellitus without complications: Secondary | ICD-10-CM | POA: Diagnosis not present

## 2016-05-22 DIAGNOSIS — R262 Difficulty in walking, not elsewhere classified: Secondary | ICD-10-CM | POA: Diagnosis not present

## 2016-05-22 DIAGNOSIS — M6281 Muscle weakness (generalized): Secondary | ICD-10-CM | POA: Diagnosis not present

## 2016-05-22 DIAGNOSIS — R269 Unspecified abnormalities of gait and mobility: Secondary | ICD-10-CM | POA: Diagnosis not present

## 2016-05-24 DIAGNOSIS — M6281 Muscle weakness (generalized): Secondary | ICD-10-CM | POA: Diagnosis not present

## 2016-05-24 DIAGNOSIS — R262 Difficulty in walking, not elsewhere classified: Secondary | ICD-10-CM | POA: Diagnosis not present

## 2016-05-24 DIAGNOSIS — R269 Unspecified abnormalities of gait and mobility: Secondary | ICD-10-CM | POA: Diagnosis not present

## 2016-05-25 DIAGNOSIS — M6281 Muscle weakness (generalized): Secondary | ICD-10-CM | POA: Diagnosis not present

## 2016-05-25 DIAGNOSIS — R262 Difficulty in walking, not elsewhere classified: Secondary | ICD-10-CM | POA: Diagnosis not present

## 2016-05-25 DIAGNOSIS — R269 Unspecified abnormalities of gait and mobility: Secondary | ICD-10-CM | POA: Diagnosis not present

## 2016-05-27 DIAGNOSIS — R110 Nausea: Secondary | ICD-10-CM | POA: Diagnosis not present

## 2016-05-27 DIAGNOSIS — R1110 Vomiting, unspecified: Secondary | ICD-10-CM | POA: Diagnosis not present

## 2016-05-28 DIAGNOSIS — R109 Unspecified abdominal pain: Secondary | ICD-10-CM | POA: Diagnosis not present

## 2016-05-28 DIAGNOSIS — I10 Essential (primary) hypertension: Secondary | ICD-10-CM | POA: Diagnosis not present

## 2016-05-28 DIAGNOSIS — N390 Urinary tract infection, site not specified: Secondary | ICD-10-CM | POA: Diagnosis not present

## 2016-05-28 DIAGNOSIS — J45909 Unspecified asthma, uncomplicated: Secondary | ICD-10-CM | POA: Diagnosis not present

## 2016-05-28 DIAGNOSIS — K5730 Diverticulosis of large intestine without perforation or abscess without bleeding: Secondary | ICD-10-CM | POA: Diagnosis not present

## 2016-05-28 DIAGNOSIS — J309 Allergic rhinitis, unspecified: Secondary | ICD-10-CM | POA: Diagnosis not present

## 2016-05-29 DIAGNOSIS — M6281 Muscle weakness (generalized): Secondary | ICD-10-CM | POA: Diagnosis not present

## 2016-05-29 DIAGNOSIS — R269 Unspecified abnormalities of gait and mobility: Secondary | ICD-10-CM | POA: Diagnosis not present

## 2016-05-29 DIAGNOSIS — R262 Difficulty in walking, not elsewhere classified: Secondary | ICD-10-CM | POA: Diagnosis not present

## 2016-05-31 DIAGNOSIS — R262 Difficulty in walking, not elsewhere classified: Secondary | ICD-10-CM | POA: Diagnosis not present

## 2016-05-31 DIAGNOSIS — M6281 Muscle weakness (generalized): Secondary | ICD-10-CM | POA: Diagnosis not present

## 2016-05-31 DIAGNOSIS — R269 Unspecified abnormalities of gait and mobility: Secondary | ICD-10-CM | POA: Diagnosis not present

## 2016-06-01 DIAGNOSIS — R269 Unspecified abnormalities of gait and mobility: Secondary | ICD-10-CM | POA: Diagnosis not present

## 2016-06-01 DIAGNOSIS — R262 Difficulty in walking, not elsewhere classified: Secondary | ICD-10-CM | POA: Diagnosis not present

## 2016-06-01 DIAGNOSIS — M6281 Muscle weakness (generalized): Secondary | ICD-10-CM | POA: Diagnosis not present

## 2016-06-04 DIAGNOSIS — M6281 Muscle weakness (generalized): Secondary | ICD-10-CM | POA: Diagnosis not present

## 2016-06-04 DIAGNOSIS — R269 Unspecified abnormalities of gait and mobility: Secondary | ICD-10-CM | POA: Diagnosis not present

## 2016-06-04 DIAGNOSIS — R262 Difficulty in walking, not elsewhere classified: Secondary | ICD-10-CM | POA: Diagnosis not present

## 2016-06-11 DIAGNOSIS — R262 Difficulty in walking, not elsewhere classified: Secondary | ICD-10-CM | POA: Diagnosis not present

## 2016-06-11 DIAGNOSIS — M6281 Muscle weakness (generalized): Secondary | ICD-10-CM | POA: Diagnosis not present

## 2016-06-11 DIAGNOSIS — R269 Unspecified abnormalities of gait and mobility: Secondary | ICD-10-CM | POA: Diagnosis not present

## 2016-06-12 DIAGNOSIS — M6281 Muscle weakness (generalized): Secondary | ICD-10-CM | POA: Diagnosis not present

## 2016-06-12 DIAGNOSIS — R262 Difficulty in walking, not elsewhere classified: Secondary | ICD-10-CM | POA: Diagnosis not present

## 2016-06-12 DIAGNOSIS — R269 Unspecified abnormalities of gait and mobility: Secondary | ICD-10-CM | POA: Diagnosis not present

## 2016-06-13 DIAGNOSIS — R269 Unspecified abnormalities of gait and mobility: Secondary | ICD-10-CM | POA: Diagnosis not present

## 2016-06-13 DIAGNOSIS — R262 Difficulty in walking, not elsewhere classified: Secondary | ICD-10-CM | POA: Diagnosis not present

## 2016-06-13 DIAGNOSIS — M6281 Muscle weakness (generalized): Secondary | ICD-10-CM | POA: Diagnosis not present

## 2016-06-18 DIAGNOSIS — M6281 Muscle weakness (generalized): Secondary | ICD-10-CM | POA: Diagnosis not present

## 2016-06-18 DIAGNOSIS — R262 Difficulty in walking, not elsewhere classified: Secondary | ICD-10-CM | POA: Diagnosis not present

## 2016-06-18 DIAGNOSIS — R269 Unspecified abnormalities of gait and mobility: Secondary | ICD-10-CM | POA: Diagnosis not present

## 2016-06-19 DIAGNOSIS — R269 Unspecified abnormalities of gait and mobility: Secondary | ICD-10-CM | POA: Diagnosis not present

## 2016-06-19 DIAGNOSIS — M6281 Muscle weakness (generalized): Secondary | ICD-10-CM | POA: Diagnosis not present

## 2016-06-19 DIAGNOSIS — R262 Difficulty in walking, not elsewhere classified: Secondary | ICD-10-CM | POA: Diagnosis not present

## 2016-06-22 DIAGNOSIS — Z79899 Other long term (current) drug therapy: Secondary | ICD-10-CM | POA: Diagnosis not present

## 2016-06-22 DIAGNOSIS — N390 Urinary tract infection, site not specified: Secondary | ICD-10-CM | POA: Diagnosis not present

## 2016-06-24 DIAGNOSIS — R410 Disorientation, unspecified: Secondary | ICD-10-CM | POA: Diagnosis not present

## 2016-06-24 DIAGNOSIS — K648 Other hemorrhoids: Secondary | ICD-10-CM | POA: Diagnosis not present

## 2016-06-24 DIAGNOSIS — R482 Apraxia: Secondary | ICD-10-CM | POA: Diagnosis not present

## 2016-06-27 DIAGNOSIS — I70203 Unspecified atherosclerosis of native arteries of extremities, bilateral legs: Secondary | ICD-10-CM | POA: Diagnosis not present

## 2016-06-27 DIAGNOSIS — I739 Peripheral vascular disease, unspecified: Secondary | ICD-10-CM | POA: Diagnosis not present

## 2016-06-27 DIAGNOSIS — B351 Tinea unguium: Secondary | ICD-10-CM | POA: Diagnosis not present

## 2016-06-27 DIAGNOSIS — M79674 Pain in right toe(s): Secondary | ICD-10-CM | POA: Diagnosis not present

## 2016-07-01 DIAGNOSIS — R1011 Right upper quadrant pain: Secondary | ICD-10-CM | POA: Diagnosis not present

## 2016-07-01 DIAGNOSIS — R410 Disorientation, unspecified: Secondary | ICD-10-CM | POA: Diagnosis not present

## 2016-07-06 DIAGNOSIS — M1990 Unspecified osteoarthritis, unspecified site: Secondary | ICD-10-CM | POA: Diagnosis not present

## 2016-07-06 DIAGNOSIS — R279 Unspecified lack of coordination: Secondary | ICD-10-CM | POA: Diagnosis not present

## 2016-07-06 DIAGNOSIS — K5730 Diverticulosis of large intestine without perforation or abscess without bleeding: Secondary | ICD-10-CM | POA: Diagnosis not present

## 2016-07-06 DIAGNOSIS — R1310 Dysphagia, unspecified: Secondary | ICD-10-CM | POA: Diagnosis not present

## 2016-07-06 DIAGNOSIS — M6281 Muscle weakness (generalized): Secondary | ICD-10-CM | POA: Diagnosis not present

## 2016-07-06 DIAGNOSIS — K219 Gastro-esophageal reflux disease without esophagitis: Secondary | ICD-10-CM | POA: Diagnosis not present

## 2016-07-09 DIAGNOSIS — K5730 Diverticulosis of large intestine without perforation or abscess without bleeding: Secondary | ICD-10-CM | POA: Diagnosis not present

## 2016-07-09 DIAGNOSIS — R279 Unspecified lack of coordination: Secondary | ICD-10-CM | POA: Diagnosis not present

## 2016-07-09 DIAGNOSIS — M1990 Unspecified osteoarthritis, unspecified site: Secondary | ICD-10-CM | POA: Diagnosis not present

## 2016-07-09 DIAGNOSIS — K219 Gastro-esophageal reflux disease without esophagitis: Secondary | ICD-10-CM | POA: Diagnosis not present

## 2016-07-09 DIAGNOSIS — R1310 Dysphagia, unspecified: Secondary | ICD-10-CM | POA: Diagnosis not present

## 2016-07-09 DIAGNOSIS — M6281 Muscle weakness (generalized): Secondary | ICD-10-CM | POA: Diagnosis not present

## 2016-07-10 DIAGNOSIS — E039 Hypothyroidism, unspecified: Secondary | ICD-10-CM | POA: Diagnosis not present

## 2016-07-12 DIAGNOSIS — K5730 Diverticulosis of large intestine without perforation or abscess without bleeding: Secondary | ICD-10-CM | POA: Diagnosis not present

## 2016-07-12 DIAGNOSIS — R279 Unspecified lack of coordination: Secondary | ICD-10-CM | POA: Diagnosis not present

## 2016-07-12 DIAGNOSIS — M1990 Unspecified osteoarthritis, unspecified site: Secondary | ICD-10-CM | POA: Diagnosis not present

## 2016-07-12 DIAGNOSIS — M6281 Muscle weakness (generalized): Secondary | ICD-10-CM | POA: Diagnosis not present

## 2016-07-12 DIAGNOSIS — R1310 Dysphagia, unspecified: Secondary | ICD-10-CM | POA: Diagnosis not present

## 2016-07-12 DIAGNOSIS — K219 Gastro-esophageal reflux disease without esophagitis: Secondary | ICD-10-CM | POA: Diagnosis not present

## 2016-07-13 DIAGNOSIS — K5730 Diverticulosis of large intestine without perforation or abscess without bleeding: Secondary | ICD-10-CM | POA: Diagnosis not present

## 2016-07-13 DIAGNOSIS — R279 Unspecified lack of coordination: Secondary | ICD-10-CM | POA: Diagnosis not present

## 2016-07-13 DIAGNOSIS — R1310 Dysphagia, unspecified: Secondary | ICD-10-CM | POA: Diagnosis not present

## 2016-07-13 DIAGNOSIS — M6281 Muscle weakness (generalized): Secondary | ICD-10-CM | POA: Diagnosis not present

## 2016-07-13 DIAGNOSIS — M1990 Unspecified osteoarthritis, unspecified site: Secondary | ICD-10-CM | POA: Diagnosis not present

## 2016-07-13 DIAGNOSIS — K219 Gastro-esophageal reflux disease without esophagitis: Secondary | ICD-10-CM | POA: Diagnosis not present

## 2016-07-15 DIAGNOSIS — R509 Fever, unspecified: Secondary | ICD-10-CM | POA: Diagnosis not present

## 2016-07-15 DIAGNOSIS — R5383 Other fatigue: Secondary | ICD-10-CM | POA: Diagnosis not present

## 2016-07-15 DIAGNOSIS — R05 Cough: Secondary | ICD-10-CM | POA: Diagnosis not present

## 2016-07-15 DIAGNOSIS — E119 Type 2 diabetes mellitus without complications: Secondary | ICD-10-CM | POA: Diagnosis not present

## 2016-07-16 DIAGNOSIS — R509 Fever, unspecified: Secondary | ICD-10-CM | POA: Diagnosis not present

## 2016-07-16 DIAGNOSIS — N390 Urinary tract infection, site not specified: Secondary | ICD-10-CM | POA: Diagnosis not present

## 2016-07-18 DIAGNOSIS — R1310 Dysphagia, unspecified: Secondary | ICD-10-CM | POA: Diagnosis not present

## 2016-07-18 DIAGNOSIS — M6281 Muscle weakness (generalized): Secondary | ICD-10-CM | POA: Diagnosis not present

## 2016-07-18 DIAGNOSIS — K5730 Diverticulosis of large intestine without perforation or abscess without bleeding: Secondary | ICD-10-CM | POA: Diagnosis not present

## 2016-07-18 DIAGNOSIS — M1990 Unspecified osteoarthritis, unspecified site: Secondary | ICD-10-CM | POA: Diagnosis not present

## 2016-07-18 DIAGNOSIS — K219 Gastro-esophageal reflux disease without esophagitis: Secondary | ICD-10-CM | POA: Diagnosis not present

## 2016-07-18 DIAGNOSIS — R279 Unspecified lack of coordination: Secondary | ICD-10-CM | POA: Diagnosis not present

## 2016-07-19 DIAGNOSIS — M1990 Unspecified osteoarthritis, unspecified site: Secondary | ICD-10-CM | POA: Diagnosis not present

## 2016-07-19 DIAGNOSIS — R279 Unspecified lack of coordination: Secondary | ICD-10-CM | POA: Diagnosis not present

## 2016-07-19 DIAGNOSIS — M6281 Muscle weakness (generalized): Secondary | ICD-10-CM | POA: Diagnosis not present

## 2016-07-19 DIAGNOSIS — K5730 Diverticulosis of large intestine without perforation or abscess without bleeding: Secondary | ICD-10-CM | POA: Diagnosis not present

## 2016-07-19 DIAGNOSIS — K219 Gastro-esophageal reflux disease without esophagitis: Secondary | ICD-10-CM | POA: Diagnosis not present

## 2016-07-19 DIAGNOSIS — R1310 Dysphagia, unspecified: Secondary | ICD-10-CM | POA: Diagnosis not present

## 2016-07-20 DIAGNOSIS — R279 Unspecified lack of coordination: Secondary | ICD-10-CM | POA: Diagnosis not present

## 2016-07-20 DIAGNOSIS — M1990 Unspecified osteoarthritis, unspecified site: Secondary | ICD-10-CM | POA: Diagnosis not present

## 2016-07-20 DIAGNOSIS — K219 Gastro-esophageal reflux disease without esophagitis: Secondary | ICD-10-CM | POA: Diagnosis not present

## 2016-07-20 DIAGNOSIS — M6281 Muscle weakness (generalized): Secondary | ICD-10-CM | POA: Diagnosis not present

## 2016-07-20 DIAGNOSIS — R1310 Dysphagia, unspecified: Secondary | ICD-10-CM | POA: Diagnosis not present

## 2016-07-20 DIAGNOSIS — K5730 Diverticulosis of large intestine without perforation or abscess without bleeding: Secondary | ICD-10-CM | POA: Diagnosis not present

## 2016-07-23 DIAGNOSIS — K219 Gastro-esophageal reflux disease without esophagitis: Secondary | ICD-10-CM | POA: Diagnosis not present

## 2016-07-23 DIAGNOSIS — M6281 Muscle weakness (generalized): Secondary | ICD-10-CM | POA: Diagnosis not present

## 2016-07-23 DIAGNOSIS — R1310 Dysphagia, unspecified: Secondary | ICD-10-CM | POA: Diagnosis not present

## 2016-07-23 DIAGNOSIS — R279 Unspecified lack of coordination: Secondary | ICD-10-CM | POA: Diagnosis not present

## 2016-07-23 DIAGNOSIS — K5730 Diverticulosis of large intestine without perforation or abscess without bleeding: Secondary | ICD-10-CM | POA: Diagnosis not present

## 2016-07-23 DIAGNOSIS — M1990 Unspecified osteoarthritis, unspecified site: Secondary | ICD-10-CM | POA: Diagnosis not present

## 2016-07-24 DIAGNOSIS — K5730 Diverticulosis of large intestine without perforation or abscess without bleeding: Secondary | ICD-10-CM | POA: Diagnosis not present

## 2016-07-24 DIAGNOSIS — K219 Gastro-esophageal reflux disease without esophagitis: Secondary | ICD-10-CM | POA: Diagnosis not present

## 2016-07-24 DIAGNOSIS — M1990 Unspecified osteoarthritis, unspecified site: Secondary | ICD-10-CM | POA: Diagnosis not present

## 2016-07-24 DIAGNOSIS — R279 Unspecified lack of coordination: Secondary | ICD-10-CM | POA: Diagnosis not present

## 2016-07-24 DIAGNOSIS — M6281 Muscle weakness (generalized): Secondary | ICD-10-CM | POA: Diagnosis not present

## 2016-07-24 DIAGNOSIS — R1310 Dysphagia, unspecified: Secondary | ICD-10-CM | POA: Diagnosis not present

## 2016-07-26 DIAGNOSIS — K219 Gastro-esophageal reflux disease without esophagitis: Secondary | ICD-10-CM | POA: Diagnosis not present

## 2016-07-26 DIAGNOSIS — M6281 Muscle weakness (generalized): Secondary | ICD-10-CM | POA: Diagnosis not present

## 2016-07-26 DIAGNOSIS — M1990 Unspecified osteoarthritis, unspecified site: Secondary | ICD-10-CM | POA: Diagnosis not present

## 2016-07-26 DIAGNOSIS — K5730 Diverticulosis of large intestine without perforation or abscess without bleeding: Secondary | ICD-10-CM | POA: Diagnosis not present

## 2016-07-26 DIAGNOSIS — R279 Unspecified lack of coordination: Secondary | ICD-10-CM | POA: Diagnosis not present

## 2016-07-26 DIAGNOSIS — R1310 Dysphagia, unspecified: Secondary | ICD-10-CM | POA: Diagnosis not present

## 2016-07-27 DIAGNOSIS — G4709 Other insomnia: Secondary | ICD-10-CM | POA: Diagnosis not present

## 2016-07-27 DIAGNOSIS — R072 Precordial pain: Secondary | ICD-10-CM | POA: Diagnosis not present

## 2016-07-27 DIAGNOSIS — I10 Essential (primary) hypertension: Secondary | ICD-10-CM | POA: Diagnosis not present

## 2016-08-01 DIAGNOSIS — Z79899 Other long term (current) drug therapy: Secondary | ICD-10-CM | POA: Diagnosis not present

## 2016-08-02 DIAGNOSIS — K5730 Diverticulosis of large intestine without perforation or abscess without bleeding: Secondary | ICD-10-CM | POA: Diagnosis not present

## 2016-08-02 DIAGNOSIS — M1990 Unspecified osteoarthritis, unspecified site: Secondary | ICD-10-CM | POA: Diagnosis not present

## 2016-08-02 DIAGNOSIS — R1310 Dysphagia, unspecified: Secondary | ICD-10-CM | POA: Diagnosis not present

## 2016-08-02 DIAGNOSIS — M6281 Muscle weakness (generalized): Secondary | ICD-10-CM | POA: Diagnosis not present

## 2016-08-02 DIAGNOSIS — R279 Unspecified lack of coordination: Secondary | ICD-10-CM | POA: Diagnosis not present

## 2016-08-02 DIAGNOSIS — K219 Gastro-esophageal reflux disease without esophagitis: Secondary | ICD-10-CM | POA: Diagnosis not present

## 2016-08-06 DIAGNOSIS — M1990 Unspecified osteoarthritis, unspecified site: Secondary | ICD-10-CM | POA: Diagnosis not present

## 2016-08-06 DIAGNOSIS — K5730 Diverticulosis of large intestine without perforation or abscess without bleeding: Secondary | ICD-10-CM | POA: Diagnosis not present

## 2016-08-06 DIAGNOSIS — G309 Alzheimer's disease, unspecified: Secondary | ICD-10-CM | POA: Diagnosis not present

## 2016-08-06 DIAGNOSIS — R279 Unspecified lack of coordination: Secondary | ICD-10-CM | POA: Diagnosis not present

## 2016-08-06 DIAGNOSIS — M6281 Muscle weakness (generalized): Secondary | ICD-10-CM | POA: Diagnosis not present

## 2016-08-07 DIAGNOSIS — E039 Hypothyroidism, unspecified: Secondary | ICD-10-CM | POA: Diagnosis not present

## 2016-08-07 DIAGNOSIS — R0602 Shortness of breath: Secondary | ICD-10-CM | POA: Diagnosis not present

## 2016-09-11 DIAGNOSIS — I83018 Varicose veins of right lower extremity with ulcer other part of lower leg: Secondary | ICD-10-CM | POA: Diagnosis not present

## 2016-09-18 DIAGNOSIS — I83018 Varicose veins of right lower extremity with ulcer other part of lower leg: Secondary | ICD-10-CM | POA: Diagnosis not present

## 2016-09-25 DIAGNOSIS — I83018 Varicose veins of right lower extremity with ulcer other part of lower leg: Secondary | ICD-10-CM | POA: Diagnosis not present

## 2016-10-02 DIAGNOSIS — J45909 Unspecified asthma, uncomplicated: Secondary | ICD-10-CM | POA: Diagnosis not present

## 2016-10-02 DIAGNOSIS — I10 Essential (primary) hypertension: Secondary | ICD-10-CM | POA: Diagnosis not present

## 2016-10-02 DIAGNOSIS — J309 Allergic rhinitis, unspecified: Secondary | ICD-10-CM | POA: Diagnosis not present

## 2016-10-02 DIAGNOSIS — K5730 Diverticulosis of large intestine without perforation or abscess without bleeding: Secondary | ICD-10-CM | POA: Diagnosis not present

## 2016-10-02 DIAGNOSIS — E559 Vitamin D deficiency, unspecified: Secondary | ICD-10-CM | POA: Diagnosis not present

## 2016-10-13 DIAGNOSIS — R278 Other lack of coordination: Secondary | ICD-10-CM | POA: Diagnosis not present

## 2016-10-13 DIAGNOSIS — M170 Bilateral primary osteoarthritis of knee: Secondary | ICD-10-CM | POA: Diagnosis not present

## 2016-10-13 DIAGNOSIS — G308 Other Alzheimer's disease: Secondary | ICD-10-CM | POA: Diagnosis not present

## 2016-10-13 DIAGNOSIS — M6281 Muscle weakness (generalized): Secondary | ICD-10-CM | POA: Diagnosis not present

## 2016-10-18 DIAGNOSIS — G308 Other Alzheimer's disease: Secondary | ICD-10-CM | POA: Diagnosis not present

## 2016-10-18 DIAGNOSIS — M170 Bilateral primary osteoarthritis of knee: Secondary | ICD-10-CM | POA: Diagnosis not present

## 2016-10-18 DIAGNOSIS — M6281 Muscle weakness (generalized): Secondary | ICD-10-CM | POA: Diagnosis not present

## 2016-10-18 DIAGNOSIS — R278 Other lack of coordination: Secondary | ICD-10-CM | POA: Diagnosis not present

## 2016-10-28 DIAGNOSIS — R5383 Other fatigue: Secondary | ICD-10-CM | POA: Diagnosis not present

## 2016-10-28 DIAGNOSIS — R400 Somnolence: Secondary | ICD-10-CM | POA: Diagnosis not present

## 2016-10-28 DIAGNOSIS — I10 Essential (primary) hypertension: Secondary | ICD-10-CM | POA: Diagnosis not present

## 2016-10-28 DIAGNOSIS — E119 Type 2 diabetes mellitus without complications: Secondary | ICD-10-CM | POA: Diagnosis not present

## 2016-10-29 DIAGNOSIS — Z79899 Other long term (current) drug therapy: Secondary | ICD-10-CM | POA: Diagnosis not present

## 2016-10-29 DIAGNOSIS — E039 Hypothyroidism, unspecified: Secondary | ICD-10-CM | POA: Diagnosis not present

## 2016-11-06 DIAGNOSIS — J45909 Unspecified asthma, uncomplicated: Secondary | ICD-10-CM | POA: Diagnosis not present

## 2016-11-06 DIAGNOSIS — I10 Essential (primary) hypertension: Secondary | ICD-10-CM | POA: Diagnosis not present

## 2016-11-06 DIAGNOSIS — J309 Allergic rhinitis, unspecified: Secondary | ICD-10-CM | POA: Diagnosis not present

## 2016-11-06 DIAGNOSIS — K5730 Diverticulosis of large intestine without perforation or abscess without bleeding: Secondary | ICD-10-CM | POA: Diagnosis not present

## 2016-11-13 DIAGNOSIS — I10 Essential (primary) hypertension: Secondary | ICD-10-CM | POA: Diagnosis not present

## 2016-11-13 DIAGNOSIS — E039 Hypothyroidism, unspecified: Secondary | ICD-10-CM | POA: Diagnosis not present

## 2016-12-02 DIAGNOSIS — R400 Somnolence: Secondary | ICD-10-CM | POA: Diagnosis not present

## 2016-12-02 DIAGNOSIS — G308 Other Alzheimer's disease: Secondary | ICD-10-CM | POA: Diagnosis not present

## 2016-12-02 DIAGNOSIS — E1121 Type 2 diabetes mellitus with diabetic nephropathy: Secondary | ICD-10-CM | POA: Diagnosis not present

## 2016-12-12 DIAGNOSIS — M79675 Pain in left toe(s): Secondary | ICD-10-CM | POA: Diagnosis not present

## 2016-12-12 DIAGNOSIS — I739 Peripheral vascular disease, unspecified: Secondary | ICD-10-CM | POA: Diagnosis not present

## 2016-12-12 DIAGNOSIS — B351 Tinea unguium: Secondary | ICD-10-CM | POA: Diagnosis not present

## 2016-12-12 DIAGNOSIS — M79674 Pain in right toe(s): Secondary | ICD-10-CM | POA: Diagnosis not present

## 2016-12-12 DIAGNOSIS — B353 Tinea pedis: Secondary | ICD-10-CM | POA: Diagnosis not present

## 2017-01-06 DIAGNOSIS — Z79899 Other long term (current) drug therapy: Secondary | ICD-10-CM | POA: Diagnosis not present

## 2017-01-06 DIAGNOSIS — E785 Hyperlipidemia, unspecified: Secondary | ICD-10-CM | POA: Diagnosis not present

## 2017-01-14 DIAGNOSIS — E559 Vitamin D deficiency, unspecified: Secondary | ICD-10-CM | POA: Diagnosis not present

## 2017-01-14 DIAGNOSIS — G308 Other Alzheimer's disease: Secondary | ICD-10-CM | POA: Diagnosis not present

## 2017-01-14 DIAGNOSIS — R1311 Dysphagia, oral phase: Secondary | ICD-10-CM | POA: Diagnosis not present

## 2017-01-14 DIAGNOSIS — G309 Alzheimer's disease, unspecified: Secondary | ICD-10-CM | POA: Diagnosis not present

## 2017-01-14 DIAGNOSIS — K219 Gastro-esophageal reflux disease without esophagitis: Secondary | ICD-10-CM | POA: Diagnosis not present

## 2017-01-14 DIAGNOSIS — M6281 Muscle weakness (generalized): Secondary | ICD-10-CM | POA: Diagnosis not present

## 2017-01-14 DIAGNOSIS — E039 Hypothyroidism, unspecified: Secondary | ICD-10-CM | POA: Diagnosis not present

## 2017-01-14 DIAGNOSIS — R54 Age-related physical debility: Secondary | ICD-10-CM | POA: Diagnosis not present

## 2017-01-14 DIAGNOSIS — R278 Other lack of coordination: Secondary | ICD-10-CM | POA: Diagnosis not present

## 2017-01-18 DIAGNOSIS — R278 Other lack of coordination: Secondary | ICD-10-CM | POA: Diagnosis not present

## 2017-01-18 DIAGNOSIS — R1311 Dysphagia, oral phase: Secondary | ICD-10-CM | POA: Diagnosis not present

## 2017-01-18 DIAGNOSIS — M6281 Muscle weakness (generalized): Secondary | ICD-10-CM | POA: Diagnosis not present

## 2017-01-18 DIAGNOSIS — K219 Gastro-esophageal reflux disease without esophagitis: Secondary | ICD-10-CM | POA: Diagnosis not present

## 2017-01-18 DIAGNOSIS — G309 Alzheimer's disease, unspecified: Secondary | ICD-10-CM | POA: Diagnosis not present

## 2017-01-18 DIAGNOSIS — R54 Age-related physical debility: Secondary | ICD-10-CM | POA: Diagnosis not present

## 2017-01-19 DIAGNOSIS — G309 Alzheimer's disease, unspecified: Secondary | ICD-10-CM | POA: Diagnosis not present

## 2017-01-19 DIAGNOSIS — K219 Gastro-esophageal reflux disease without esophagitis: Secondary | ICD-10-CM | POA: Diagnosis not present

## 2017-01-19 DIAGNOSIS — M6281 Muscle weakness (generalized): Secondary | ICD-10-CM | POA: Diagnosis not present

## 2017-01-19 DIAGNOSIS — R54 Age-related physical debility: Secondary | ICD-10-CM | POA: Diagnosis not present

## 2017-01-19 DIAGNOSIS — R1311 Dysphagia, oral phase: Secondary | ICD-10-CM | POA: Diagnosis not present

## 2017-01-19 DIAGNOSIS — R278 Other lack of coordination: Secondary | ICD-10-CM | POA: Diagnosis not present

## 2017-01-21 DIAGNOSIS — R1311 Dysphagia, oral phase: Secondary | ICD-10-CM | POA: Diagnosis not present

## 2017-01-21 DIAGNOSIS — R278 Other lack of coordination: Secondary | ICD-10-CM | POA: Diagnosis not present

## 2017-01-21 DIAGNOSIS — M6281 Muscle weakness (generalized): Secondary | ICD-10-CM | POA: Diagnosis not present

## 2017-01-21 DIAGNOSIS — K219 Gastro-esophageal reflux disease without esophagitis: Secondary | ICD-10-CM | POA: Diagnosis not present

## 2017-01-21 DIAGNOSIS — G309 Alzheimer's disease, unspecified: Secondary | ICD-10-CM | POA: Diagnosis not present

## 2017-01-21 DIAGNOSIS — R54 Age-related physical debility: Secondary | ICD-10-CM | POA: Diagnosis not present

## 2017-01-22 DIAGNOSIS — R278 Other lack of coordination: Secondary | ICD-10-CM | POA: Diagnosis not present

## 2017-01-22 DIAGNOSIS — K219 Gastro-esophageal reflux disease without esophagitis: Secondary | ICD-10-CM | POA: Diagnosis not present

## 2017-01-22 DIAGNOSIS — G309 Alzheimer's disease, unspecified: Secondary | ICD-10-CM | POA: Diagnosis not present

## 2017-01-22 DIAGNOSIS — M6281 Muscle weakness (generalized): Secondary | ICD-10-CM | POA: Diagnosis not present

## 2017-01-22 DIAGNOSIS — R54 Age-related physical debility: Secondary | ICD-10-CM | POA: Diagnosis not present

## 2017-01-22 DIAGNOSIS — R1311 Dysphagia, oral phase: Secondary | ICD-10-CM | POA: Diagnosis not present

## 2017-01-23 DIAGNOSIS — R54 Age-related physical debility: Secondary | ICD-10-CM | POA: Diagnosis not present

## 2017-01-23 DIAGNOSIS — R278 Other lack of coordination: Secondary | ICD-10-CM | POA: Diagnosis not present

## 2017-01-23 DIAGNOSIS — R1311 Dysphagia, oral phase: Secondary | ICD-10-CM | POA: Diagnosis not present

## 2017-01-23 DIAGNOSIS — K219 Gastro-esophageal reflux disease without esophagitis: Secondary | ICD-10-CM | POA: Diagnosis not present

## 2017-01-23 DIAGNOSIS — G309 Alzheimer's disease, unspecified: Secondary | ICD-10-CM | POA: Diagnosis not present

## 2017-01-23 DIAGNOSIS — M6281 Muscle weakness (generalized): Secondary | ICD-10-CM | POA: Diagnosis not present

## 2017-01-31 DIAGNOSIS — K219 Gastro-esophageal reflux disease without esophagitis: Secondary | ICD-10-CM | POA: Diagnosis not present

## 2017-01-31 DIAGNOSIS — R54 Age-related physical debility: Secondary | ICD-10-CM | POA: Diagnosis not present

## 2017-01-31 DIAGNOSIS — R278 Other lack of coordination: Secondary | ICD-10-CM | POA: Diagnosis not present

## 2017-01-31 DIAGNOSIS — R1311 Dysphagia, oral phase: Secondary | ICD-10-CM | POA: Diagnosis not present

## 2017-01-31 DIAGNOSIS — G309 Alzheimer's disease, unspecified: Secondary | ICD-10-CM | POA: Diagnosis not present

## 2017-01-31 DIAGNOSIS — M6281 Muscle weakness (generalized): Secondary | ICD-10-CM | POA: Diagnosis not present

## 2017-02-04 DIAGNOSIS — G309 Alzheimer's disease, unspecified: Secondary | ICD-10-CM | POA: Diagnosis not present

## 2017-02-04 DIAGNOSIS — I872 Venous insufficiency (chronic) (peripheral): Secondary | ICD-10-CM | POA: Diagnosis not present

## 2017-02-04 DIAGNOSIS — G308 Other Alzheimer's disease: Secondary | ICD-10-CM | POA: Diagnosis not present

## 2017-02-04 DIAGNOSIS — R278 Other lack of coordination: Secondary | ICD-10-CM | POA: Diagnosis not present

## 2017-02-04 DIAGNOSIS — M6281 Muscle weakness (generalized): Secondary | ICD-10-CM | POA: Diagnosis not present

## 2017-02-05 DIAGNOSIS — M6281 Muscle weakness (generalized): Secondary | ICD-10-CM | POA: Diagnosis not present

## 2017-02-05 DIAGNOSIS — G309 Alzheimer's disease, unspecified: Secondary | ICD-10-CM | POA: Diagnosis not present

## 2017-02-05 DIAGNOSIS — G308 Other Alzheimer's disease: Secondary | ICD-10-CM | POA: Diagnosis not present

## 2017-02-05 DIAGNOSIS — I872 Venous insufficiency (chronic) (peripheral): Secondary | ICD-10-CM | POA: Diagnosis not present

## 2017-02-05 DIAGNOSIS — R278 Other lack of coordination: Secondary | ICD-10-CM | POA: Diagnosis not present

## 2017-02-06 DIAGNOSIS — G308 Other Alzheimer's disease: Secondary | ICD-10-CM | POA: Diagnosis not present

## 2017-02-06 DIAGNOSIS — M6281 Muscle weakness (generalized): Secondary | ICD-10-CM | POA: Diagnosis not present

## 2017-02-06 DIAGNOSIS — R278 Other lack of coordination: Secondary | ICD-10-CM | POA: Diagnosis not present

## 2017-02-06 DIAGNOSIS — G309 Alzheimer's disease, unspecified: Secondary | ICD-10-CM | POA: Diagnosis not present

## 2017-02-06 DIAGNOSIS — I872 Venous insufficiency (chronic) (peripheral): Secondary | ICD-10-CM | POA: Diagnosis not present

## 2017-02-07 DIAGNOSIS — R278 Other lack of coordination: Secondary | ICD-10-CM | POA: Diagnosis not present

## 2017-02-07 DIAGNOSIS — I872 Venous insufficiency (chronic) (peripheral): Secondary | ICD-10-CM | POA: Diagnosis not present

## 2017-02-07 DIAGNOSIS — G308 Other Alzheimer's disease: Secondary | ICD-10-CM | POA: Diagnosis not present

## 2017-02-07 DIAGNOSIS — G309 Alzheimer's disease, unspecified: Secondary | ICD-10-CM | POA: Diagnosis not present

## 2017-02-07 DIAGNOSIS — M6281 Muscle weakness (generalized): Secondary | ICD-10-CM | POA: Diagnosis not present

## 2017-02-08 DIAGNOSIS — R278 Other lack of coordination: Secondary | ICD-10-CM | POA: Diagnosis not present

## 2017-02-08 DIAGNOSIS — I872 Venous insufficiency (chronic) (peripheral): Secondary | ICD-10-CM | POA: Diagnosis not present

## 2017-02-08 DIAGNOSIS — G309 Alzheimer's disease, unspecified: Secondary | ICD-10-CM | POA: Diagnosis not present

## 2017-02-08 DIAGNOSIS — G308 Other Alzheimer's disease: Secondary | ICD-10-CM | POA: Diagnosis not present

## 2017-02-08 DIAGNOSIS — M6281 Muscle weakness (generalized): Secondary | ICD-10-CM | POA: Diagnosis not present

## 2017-02-11 DIAGNOSIS — R278 Other lack of coordination: Secondary | ICD-10-CM | POA: Diagnosis not present

## 2017-02-11 DIAGNOSIS — G309 Alzheimer's disease, unspecified: Secondary | ICD-10-CM | POA: Diagnosis not present

## 2017-02-11 DIAGNOSIS — I872 Venous insufficiency (chronic) (peripheral): Secondary | ICD-10-CM | POA: Diagnosis not present

## 2017-02-11 DIAGNOSIS — M6281 Muscle weakness (generalized): Secondary | ICD-10-CM | POA: Diagnosis not present

## 2017-02-11 DIAGNOSIS — G308 Other Alzheimer's disease: Secondary | ICD-10-CM | POA: Diagnosis not present

## 2017-02-12 DIAGNOSIS — M6281 Muscle weakness (generalized): Secondary | ICD-10-CM | POA: Diagnosis not present

## 2017-02-12 DIAGNOSIS — R278 Other lack of coordination: Secondary | ICD-10-CM | POA: Diagnosis not present

## 2017-02-12 DIAGNOSIS — G308 Other Alzheimer's disease: Secondary | ICD-10-CM | POA: Diagnosis not present

## 2017-02-12 DIAGNOSIS — I872 Venous insufficiency (chronic) (peripheral): Secondary | ICD-10-CM | POA: Diagnosis not present

## 2017-02-12 DIAGNOSIS — G309 Alzheimer's disease, unspecified: Secondary | ICD-10-CM | POA: Diagnosis not present

## 2017-02-13 DIAGNOSIS — R278 Other lack of coordination: Secondary | ICD-10-CM | POA: Diagnosis not present

## 2017-02-13 DIAGNOSIS — G309 Alzheimer's disease, unspecified: Secondary | ICD-10-CM | POA: Diagnosis not present

## 2017-02-13 DIAGNOSIS — G308 Other Alzheimer's disease: Secondary | ICD-10-CM | POA: Diagnosis not present

## 2017-02-13 DIAGNOSIS — I872 Venous insufficiency (chronic) (peripheral): Secondary | ICD-10-CM | POA: Diagnosis not present

## 2017-02-13 DIAGNOSIS — M6281 Muscle weakness (generalized): Secondary | ICD-10-CM | POA: Diagnosis not present

## 2017-02-15 DIAGNOSIS — R278 Other lack of coordination: Secondary | ICD-10-CM | POA: Diagnosis not present

## 2017-02-15 DIAGNOSIS — G308 Other Alzheimer's disease: Secondary | ICD-10-CM | POA: Diagnosis not present

## 2017-02-15 DIAGNOSIS — M6281 Muscle weakness (generalized): Secondary | ICD-10-CM | POA: Diagnosis not present

## 2017-02-15 DIAGNOSIS — I872 Venous insufficiency (chronic) (peripheral): Secondary | ICD-10-CM | POA: Diagnosis not present

## 2017-02-15 DIAGNOSIS — G309 Alzheimer's disease, unspecified: Secondary | ICD-10-CM | POA: Diagnosis not present

## 2017-02-16 DIAGNOSIS — R509 Fever, unspecified: Secondary | ICD-10-CM | POA: Diagnosis not present

## 2017-02-17 DIAGNOSIS — R0609 Other forms of dyspnea: Secondary | ICD-10-CM | POA: Diagnosis not present

## 2017-02-17 DIAGNOSIS — R410 Disorientation, unspecified: Secondary | ICD-10-CM | POA: Diagnosis not present

## 2017-02-17 DIAGNOSIS — N189 Chronic kidney disease, unspecified: Secondary | ICD-10-CM | POA: Diagnosis not present

## 2017-02-17 DIAGNOSIS — R509 Fever, unspecified: Secondary | ICD-10-CM | POA: Diagnosis not present

## 2017-02-18 DIAGNOSIS — M6281 Muscle weakness (generalized): Secondary | ICD-10-CM | POA: Diagnosis not present

## 2017-02-18 DIAGNOSIS — R278 Other lack of coordination: Secondary | ICD-10-CM | POA: Diagnosis not present

## 2017-02-18 DIAGNOSIS — E039 Hypothyroidism, unspecified: Secondary | ICD-10-CM | POA: Diagnosis not present

## 2017-02-18 DIAGNOSIS — E559 Vitamin D deficiency, unspecified: Secondary | ICD-10-CM | POA: Diagnosis not present

## 2017-02-18 DIAGNOSIS — I872 Venous insufficiency (chronic) (peripheral): Secondary | ICD-10-CM | POA: Diagnosis not present

## 2017-02-18 DIAGNOSIS — G309 Alzheimer's disease, unspecified: Secondary | ICD-10-CM | POA: Diagnosis not present

## 2017-02-18 DIAGNOSIS — Z0189 Encounter for other specified special examinations: Secondary | ICD-10-CM | POA: Diagnosis not present

## 2017-02-18 DIAGNOSIS — G308 Other Alzheimer's disease: Secondary | ICD-10-CM | POA: Diagnosis not present

## 2017-02-18 DIAGNOSIS — M7989 Other specified soft tissue disorders: Secondary | ICD-10-CM | POA: Diagnosis not present

## 2017-02-21 DIAGNOSIS — R278 Other lack of coordination: Secondary | ICD-10-CM | POA: Diagnosis not present

## 2017-02-21 DIAGNOSIS — M6281 Muscle weakness (generalized): Secondary | ICD-10-CM | POA: Diagnosis not present

## 2017-02-21 DIAGNOSIS — G308 Other Alzheimer's disease: Secondary | ICD-10-CM | POA: Diagnosis not present

## 2017-02-21 DIAGNOSIS — G309 Alzheimer's disease, unspecified: Secondary | ICD-10-CM | POA: Diagnosis not present

## 2017-02-21 DIAGNOSIS — I872 Venous insufficiency (chronic) (peripheral): Secondary | ICD-10-CM | POA: Diagnosis not present

## 2017-02-25 ENCOUNTER — Emergency Department (HOSPITAL_COMMUNITY): Payer: Medicare Other

## 2017-02-25 ENCOUNTER — Inpatient Hospital Stay (HOSPITAL_COMMUNITY)
Admission: EM | Admit: 2017-02-25 | Discharge: 2017-03-01 | DRG: 872 | Disposition: A | Discharge status: Skilled Nursing Facility | Payer: Medicare Other | Attending: Internal Medicine | Admitting: Internal Medicine

## 2017-02-25 ENCOUNTER — Encounter (HOSPITAL_COMMUNITY): Payer: Self-pay | Admitting: Emergency Medicine

## 2017-02-25 DIAGNOSIS — L0390 Cellulitis, unspecified: Secondary | ICD-10-CM | POA: Diagnosis not present

## 2017-02-25 DIAGNOSIS — I69311 Memory deficit following cerebral infarction: Secondary | ICD-10-CM | POA: Diagnosis not present

## 2017-02-25 DIAGNOSIS — F0390 Unspecified dementia without behavioral disturbance: Secondary | ICD-10-CM | POA: Diagnosis not present

## 2017-02-25 DIAGNOSIS — R4182 Altered mental status, unspecified: Secondary | ICD-10-CM | POA: Diagnosis not present

## 2017-02-25 DIAGNOSIS — E11649 Type 2 diabetes mellitus with hypoglycemia without coma: Secondary | ICD-10-CM | POA: Diagnosis present

## 2017-02-25 DIAGNOSIS — I872 Venous insufficiency (chronic) (peripheral): Secondary | ICD-10-CM | POA: Diagnosis present

## 2017-02-25 DIAGNOSIS — Z79899 Other long term (current) drug therapy: Secondary | ICD-10-CM

## 2017-02-25 DIAGNOSIS — J45909 Unspecified asthma, uncomplicated: Secondary | ICD-10-CM | POA: Diagnosis present

## 2017-02-25 DIAGNOSIS — Z794 Long term (current) use of insulin: Secondary | ICD-10-CM

## 2017-02-25 DIAGNOSIS — I2510 Atherosclerotic heart disease of native coronary artery without angina pectoris: Secondary | ICD-10-CM | POA: Diagnosis not present

## 2017-02-25 DIAGNOSIS — Z885 Allergy status to narcotic agent status: Secondary | ICD-10-CM

## 2017-02-25 DIAGNOSIS — R238 Other skin changes: Secondary | ICD-10-CM

## 2017-02-25 DIAGNOSIS — I5033 Acute on chronic diastolic (congestive) heart failure: Secondary | ICD-10-CM | POA: Diagnosis not present

## 2017-02-25 DIAGNOSIS — Z86718 Personal history of other venous thrombosis and embolism: Secondary | ICD-10-CM

## 2017-02-25 DIAGNOSIS — E119 Type 2 diabetes mellitus without complications: Secondary | ICD-10-CM | POA: Diagnosis not present

## 2017-02-25 DIAGNOSIS — K219 Gastro-esophageal reflux disease without esophagitis: Secondary | ICD-10-CM | POA: Diagnosis present

## 2017-02-25 DIAGNOSIS — R601 Generalized edema: Secondary | ICD-10-CM | POA: Diagnosis present

## 2017-02-25 DIAGNOSIS — A419 Sepsis, unspecified organism: Secondary | ICD-10-CM | POA: Diagnosis not present

## 2017-02-25 DIAGNOSIS — Z87440 Personal history of urinary (tract) infections: Secondary | ICD-10-CM

## 2017-02-25 DIAGNOSIS — F419 Anxiety disorder, unspecified: Secondary | ICD-10-CM | POA: Diagnosis present

## 2017-02-25 DIAGNOSIS — B957 Other staphylococcus as the cause of diseases classified elsewhere: Secondary | ICD-10-CM | POA: Diagnosis not present

## 2017-02-25 DIAGNOSIS — F0150 Vascular dementia without behavioral disturbance: Secondary | ICD-10-CM | POA: Diagnosis present

## 2017-02-25 DIAGNOSIS — L03116 Cellulitis of left lower limb: Secondary | ICD-10-CM | POA: Diagnosis not present

## 2017-02-25 DIAGNOSIS — E7800 Pure hypercholesterolemia, unspecified: Secondary | ICD-10-CM | POA: Diagnosis present

## 2017-02-25 DIAGNOSIS — E559 Vitamin D deficiency, unspecified: Secondary | ICD-10-CM | POA: Diagnosis present

## 2017-02-25 DIAGNOSIS — L8990 Pressure ulcer of unspecified site, unspecified stage: Secondary | ICD-10-CM | POA: Insufficient documentation

## 2017-02-25 DIAGNOSIS — Z7901 Long term (current) use of anticoagulants: Secondary | ICD-10-CM

## 2017-02-25 DIAGNOSIS — Z66 Do not resuscitate: Secondary | ICD-10-CM | POA: Diagnosis present

## 2017-02-25 DIAGNOSIS — F0280 Dementia in other diseases classified elsewhere without behavioral disturbance: Secondary | ICD-10-CM | POA: Diagnosis present

## 2017-02-25 DIAGNOSIS — E039 Hypothyroidism, unspecified: Secondary | ICD-10-CM | POA: Diagnosis present

## 2017-02-25 DIAGNOSIS — E1151 Type 2 diabetes mellitus with diabetic peripheral angiopathy without gangrene: Secondary | ICD-10-CM | POA: Diagnosis present

## 2017-02-25 DIAGNOSIS — R7881 Bacteremia: Secondary | ICD-10-CM | POA: Diagnosis not present

## 2017-02-25 DIAGNOSIS — Z90710 Acquired absence of both cervix and uterus: Secondary | ICD-10-CM

## 2017-02-25 DIAGNOSIS — L539 Erythematous condition, unspecified: Secondary | ICD-10-CM

## 2017-02-25 DIAGNOSIS — G309 Alzheimer's disease, unspecified: Secondary | ICD-10-CM | POA: Diagnosis present

## 2017-02-25 DIAGNOSIS — B958 Unspecified staphylococcus as the cause of diseases classified elsewhere: Secondary | ICD-10-CM | POA: Diagnosis present

## 2017-02-25 DIAGNOSIS — R21 Rash and other nonspecific skin eruption: Secondary | ICD-10-CM | POA: Diagnosis present

## 2017-02-25 DIAGNOSIS — E785 Hyperlipidemia, unspecified: Secondary | ICD-10-CM | POA: Diagnosis present

## 2017-02-25 DIAGNOSIS — R6520 Severe sepsis without septic shock: Secondary | ICD-10-CM | POA: Diagnosis not present

## 2017-02-25 DIAGNOSIS — I10 Essential (primary) hypertension: Secondary | ICD-10-CM | POA: Diagnosis not present

## 2017-02-25 DIAGNOSIS — L89622 Pressure ulcer of left heel, stage 2: Secondary | ICD-10-CM | POA: Diagnosis present

## 2017-02-25 DIAGNOSIS — E1122 Type 2 diabetes mellitus with diabetic chronic kidney disease: Secondary | ICD-10-CM | POA: Diagnosis present

## 2017-02-25 DIAGNOSIS — R509 Fever, unspecified: Secondary | ICD-10-CM | POA: Diagnosis not present

## 2017-02-25 DIAGNOSIS — Z7982 Long term (current) use of aspirin: Secondary | ICD-10-CM

## 2017-02-25 DIAGNOSIS — I671 Cerebral aneurysm, nonruptured: Secondary | ICD-10-CM | POA: Diagnosis present

## 2017-02-25 DIAGNOSIS — N183 Chronic kidney disease, stage 3 (moderate): Secondary | ICD-10-CM | POA: Diagnosis present

## 2017-02-25 DIAGNOSIS — R600 Localized edema: Secondary | ICD-10-CM | POA: Diagnosis not present

## 2017-02-25 DIAGNOSIS — Z9181 History of falling: Secondary | ICD-10-CM

## 2017-02-25 DIAGNOSIS — Z881 Allergy status to other antibiotic agents status: Secondary | ICD-10-CM

## 2017-02-25 DIAGNOSIS — I129 Hypertensive chronic kidney disease with stage 1 through stage 4 chronic kidney disease, or unspecified chronic kidney disease: Secondary | ICD-10-CM | POA: Diagnosis present

## 2017-02-25 LAB — URINALYSIS, ROUTINE W REFLEX MICROSCOPIC
BILIRUBIN URINE: NEGATIVE
GLUCOSE, UA: NEGATIVE mg/dL
HGB URINE DIPSTICK: NEGATIVE
Ketones, ur: NEGATIVE mg/dL
Leukocytes, UA: NEGATIVE
Nitrite: NEGATIVE
PROTEIN: NEGATIVE mg/dL
Specific Gravity, Urine: 1.011 (ref 1.005–1.030)
pH: 5 (ref 5.0–8.0)

## 2017-02-25 LAB — CBC WITH DIFFERENTIAL/PLATELET
BASOS PCT: 0 %
Basophils Absolute: 0 10*3/uL (ref 0.0–0.1)
EOS ABS: 0 10*3/uL (ref 0.0–0.7)
Eosinophils Relative: 0 %
HCT: 39.3 % (ref 36.0–46.0)
Hemoglobin: 13 g/dL (ref 12.0–15.0)
Lymphocytes Relative: 6 %
Lymphs Abs: 1.4 10*3/uL (ref 0.7–4.0)
MCH: 33.1 pg (ref 26.0–34.0)
MCHC: 33.1 g/dL (ref 30.0–36.0)
MCV: 100 fL (ref 78.0–100.0)
MONOS PCT: 5 %
Monocytes Absolute: 1.1 10*3/uL — ABNORMAL HIGH (ref 0.1–1.0)
NEUTROS PCT: 89 %
Neutro Abs: 18.7 10*3/uL — ABNORMAL HIGH (ref 1.7–7.7)
Platelets: 178 10*3/uL (ref 150–400)
RBC: 3.93 MIL/uL (ref 3.87–5.11)
RDW: 14 % (ref 11.5–15.5)
WBC: 21.2 10*3/uL — ABNORMAL HIGH (ref 4.0–10.5)

## 2017-02-25 LAB — COMPREHENSIVE METABOLIC PANEL
ALBUMIN: 2.7 g/dL — AB (ref 3.5–5.0)
ALK PHOS: 116 U/L (ref 38–126)
ALT: 21 U/L (ref 14–54)
ANION GAP: 7 (ref 5–15)
AST: 22 U/L (ref 15–41)
BILIRUBIN TOTAL: 0.6 mg/dL (ref 0.3–1.2)
BUN: 29 mg/dL — ABNORMAL HIGH (ref 6–20)
CALCIUM: 9.8 mg/dL (ref 8.9–10.3)
CO2: 25 mmol/L (ref 22–32)
Chloride: 106 mmol/L (ref 101–111)
Creatinine, Ser: 1.76 mg/dL — ABNORMAL HIGH (ref 0.44–1.00)
GFR calc Af Amer: 30 mL/min — ABNORMAL LOW (ref >60)
GFR, EST NON AFRICAN AMERICAN: 26 mL/min — AB (ref >60)
GLUCOSE: 151 mg/dL — AB (ref 65–99)
Potassium: 4.2 mmol/L (ref 3.5–5.1)
Sodium: 138 mmol/L (ref 135–145)
TOTAL PROTEIN: 6 g/dL — AB (ref 6.5–8.1)

## 2017-02-25 LAB — I-STAT CG4 LACTIC ACID, ED: LACTIC ACID, VENOUS: 1.88 mmol/L (ref 0.5–1.9)

## 2017-02-25 LAB — PROTIME-INR
INR: 2.14
Prothrombin Time: 24.3 seconds — ABNORMAL HIGH (ref 11.4–15.2)

## 2017-02-25 MED ORDER — VANCOMYCIN HCL IN DEXTROSE 750-5 MG/150ML-% IV SOLN: 750.0000 mg | INTRAVENOUS | Status: DC

## 2017-02-25 MED ORDER — DEXTROSE 5 % IV SOLN
1.0000 g | INTRAVENOUS | Status: DC
  Administered 2017-02-26: 1 g via INTRAVENOUS
  Filled 2017-02-25: qty 1

## 2017-02-25 MED ORDER — SODIUM CHLORIDE 0.9 % IV BOLUS (SEPSIS)
500.0000 mL | Freq: Once | INTRAVENOUS | Status: AC
  Administered 2017-02-25: 500 mL via INTRAVENOUS

## 2017-02-25 MED ORDER — VANCOMYCIN HCL IN DEXTROSE 1-5 GM/200ML-% IV SOLN
1000.0000 mg | Freq: Once | INTRAVENOUS | Status: AC
  Administered 2017-02-26: 1000 mg via INTRAVENOUS
  Filled 2017-02-25 (×2): qty 200

## 2017-02-25 MED ORDER — DEXTROSE 5 % IV SOLN
2.0000 g | Freq: Once | INTRAVENOUS | Status: AC
  Administered 2017-02-25: 2 g via INTRAVENOUS
  Filled 2017-02-25: qty 2

## 2017-02-25 NOTE — ED Provider Notes (Addendum)
[facility]-EMERGENCY DEPT Provider Note   CSN: [id number] Arrival date & time: 02/25/17  2122     History   Chief Complaint Chief Complaint  Patient presents with  . Altered Mental Status    (cellulitis to L thigh)   Level V caveat due to dementia. HPI Andrea Mccoy is a 83 y.o. female.  HPI Patient brought in from nursing home for fever. Has been on Rocephin. Also history of frequent urinary tract infections. Reportedly had altered mental status but patient's family members here and states she is at her baseline. Says she doesn't feel good. Given medicines for fever at nursing home. Has cellulitis on left thigh. Reportedly had some hypoxia a week and a half ago on last note. Had brief discussion with family members about CODE STATUS. Family does not think she would want to be on life support but would do intubation of a thought she would get better.   Past Medical History:  Diagnosis Date  . Allergic rhinitis   . Alzheimer's dementia   . Anemia   . Anxiety   . Arteriosclerotic heart disease   . Asthmatic bronchitis   . Calculus of gallbladder without cholecystitis without obstruction   . Cholelithiasis   . Chronic UTI   . Constipation   . Diverticulosis of colon   . DJD (degenerative joint disease)   . Gait abnormality   . GERD (gastroesophageal reflux disease)   . History of falling   . Hypercholesteremia   . Hyperlipidemia   . Hypertension   . Hypokalemia   . Hypothyroid   . Intracranial aneurysm   . Memory loss   . Multinodular goiter   . Nontoxic multinodular goiter   . TIA (transient ischemic attack)   . Venous insufficiency (chronic) (peripheral)   . Vitamin D deficiency     Patient Active Problem List   Diagnosis Date Noted  . CAD (coronary artery disease) 08/28/2011  . Dyslipidemia 08/28/2011  . Syncope 01/04/2011  . NONTOXIC MULTINODULAR GOITER 05/13/2010  . INTRACRANIAL ANEURYSM 05/13/2010  . VENOUS INSUFFICIENCY, CHRONIC 01/09/2010  . NUMBNESS  11/18/2008  . ALLERGIC RHINITIS 06/19/2009  . HYPOTHYROIDISM 05/25/2008  . ASTHMATIC BRONCHITIS, ACUTE 05/25/2008  . DIVERTICULOSIS OF COLON 09/23/2007  . MEMORY LOSS 09/23/2007  . TIA 09/03/2007  . HYPERCHOLESTEROLEMIA 07/16/2007  . ANEMIA 07/16/2007  . ANXIETY 07/16/2007  . HYPERTENSION 07/16/2007  . ARTERIOSCLEROTIC HEART DISEASE 07/16/2007  . GERD 07/16/2007  . URINARY TRACT INFECTION, CHRONIC 07/16/2007  . DEGENERATIVE JOINT DISEASE 07/16/2007    Past Surgical History:  Procedure Laterality Date  . ABDOMINAL HYSTERECTOMY    . bilateral THR's    . LAPAROSCOPIC CHOLECYSTECTOMY  01/2007   Dr. Odie Sera  . left femur fracture  04/2002   Dr. Ranell Patrick  . left total hip    . LUMBAR FUSION    . right total hip    . right total knee  11/1999   Dr. Leslee Home    OB History    No data available       Home Medications    Prior to Admission medications   Medication Sig Start Date End Date Taking? Authorizing Provider  acetaminophen (TYLENOL) 500 MG tablet Take 2 tablets (1,000 mg total) by mouth every 6 (six) hours as needed for moderate pain. 10/28/15  Yes Lyndal Pulley, MD  acetaminophen (TYLENOL) 650 MG CR tablet Take 650 mg by mouth every 4 (four) hours as needed for pain.   Yes [provider]  albuterol (PROVENTIL) (2.5 MG/3ML)  0.083% nebulizer solution Take 2.5 mg by nebulization 3 (three) times daily as needed for wheezing or shortness of breath.   Yes [provider]  Amino Acids-Protein Hydrolys (FEEDING SUPPLEMENT, PRO-STAT SUGAR FREE 64,) LIQD Take 30 mLs by mouth daily.   Yes [provider]  aspirin 81 MG chewable tablet Chew 81 mg by mouth daily.   Yes [provider]  Cholecalciferol (VITAMIN D) 2000 UNITS CAPS Take 1 capsule by mouth daily.   Yes [provider]  fish oil-omega-3 fatty acids 1000 MG capsule Take 1 g by mouth daily.     Yes [provider]  furosemide (LASIX) 40 MG tablet Take 1 tablet (40 mg  total) by mouth daily. 1/2 tablet by mouth daily Patient taking differently: Take 40 mg by mouth 2 (two) times daily.  11/14/12 02/25/17 Yes Michele Mcalpine, MD  furosemide (LASIX) 40 MG tablet Take 0.5 tablets (20 mg total) by mouth daily after lunch. Patient taking differently: Take 40 mg by mouth daily.  10/28/15  Yes Lyndal Pulley, MD  HYDROcodone-acetaminophen (NORCO/VICODIN) 5-325 MG tablet Take 1 tablet by mouth every 6 (six) hours as needed for moderate pain.   Yes [provider]  hydroxypropyl methylcellulose / hypromellose (ISOPTO TEARS / GONIOVISC) 2.5 % ophthalmic solution Place 1 drop into both eyes 4 (four) times daily.   Yes [provider]  insulin aspart (NOVOLOG FLEXPEN) 100 UNIT/ML FlexPen Inject 0-12 Units into the skin 4 (four) times daily as needed for high blood sugar. Less than 150=0 units 151-200=4 units 201-250=6 units 251-300=8 units 301-350=10 units Greater than 351=12 units   Yes [provider]  insulin detemir (LEVEMIR) 100 UNIT/ML injection Inject 30 Units into the skin at bedtime.   Yes [provider]  isosorbide mononitrate (IMDUR) 30 MG 24 hr tablet Take 1/2 tablet by mouth once daily 11/14/12  Yes Michele Mcalpine, MD  lactulose [facility]) 10 GM/15ML solution Take 45 g by mouth daily.    Yes [provider]  levothyroxine (SYNTHROID, LEVOTHROID) 75 MCG tablet Take 1 tablet (75 mcg total) by mouth daily. Patient taking differently: Take 25 mcg by mouth daily.  11/14/12  Yes Michele Mcalpine, MD  lidocaine (XYLOCAINE) 2 % solution 20 mLs 2 (two) times daily. Apply to rectal area   Yes [provider]  Memantine HCl-Donepezil HCl (NAMZARIC) 28-10 MG CP24 Take 1 capsule by mouth daily.   Yes [provider]  methylphenidate (RITALIN) 10 MG tablet Take 10 mg by mouth every morning.   Yes [provider]  metoCLOPramide (REGLAN) 5 MG tablet Take 5 mg by mouth 2 (two) times daily.   Yes [provider]  metoprolol succinate (TOPROL-XL) 25 MG 24 hr tablet Take 100 mg by mouth daily.    Yes [provider]  Multiple Vitamins-Minerals (CENTRUM SILVER PO) Take 1 tablet by mouth daily.     Yes [provider]  potassium chloride SA (K-DUR,KLOR-CON) 20 MEQ tablet Take 1 tablet (20 mEq total) by mouth daily. Patient taking differently: Take 20 mEq by mouth 2 (two) times daily.  11/14/12 02/25/17 Yes Michele Mcalpine, MD  pravastatin (PRAVACHOL) 10 MG tablet Take 10 mg by mouth at bedtime.   Yes [provider]  pseudoephedrine-dextromethorphan-guaifenesin (ROBITUSSIN-PE) 30-10-100 MG/5ML solution Take 10 mLs by mouth 3 (three) times daily as needed for cough.   Yes [provider]  spironolactone (ALDACTONE) 25 MG tablet Take 25 mg by mouth every Tuesday, Thursday, and  Saturday at 6 PM.   Yes [provider]  warfarin (COUMADIN) 2 MG tablet Take 2-4 mg by mouth daily. Take 2 mg on Tuesday, Thursday Saturday and Sunday. Take 4 mg all other days of the week   Yes [provider]  witch hazel-glycerin (TUCKS) pad Apply 1 application topically daily as needed for hemorrhoids.   Yes [provider]  Zinc Oxide (DESITIN) 13 % CREA Apply 1 application topically See admin instructions. Each shift and as needed for skin irration   Yes [provider]    Family History No family history on file.  Social History Social History  Substance Use Topics  . Smoking status: Never Smoker  . Smokeless tobacco: Never Used  . Alcohol use No     Allergies   Morphine and Ofloxacin   Review of Systems Review of Systems  Unable to perform ROS: Dementia  Constitutional: Positive for fever.     Physical Exam Updated Vital Signs BP (!) 132/97   Pulse 75   Temp (!) 101.4 F (38.6 C) (Rectal)   Resp (!) 23   Wt 96.6 kg (213 lb)   SpO2 100%   BMI 34.38 kg/m   Physical Exam  Constitutional: She appears well-developed.    HENT:  Head: Atraumatic.  Eyes: EOM are normal.  Neck: Neck supple.  Cardiovascular: Normal rate.   Pulmonary/Chest: Effort normal. She has no wheezes. She has no rales.  Abdominal:  Mild diffuse tenderness.  Musculoskeletal: She exhibits tenderness.  Lateral left thigh has induration and erythema. No fluctuance. Large amount goes down to knee and up to buttocks.  Skin: Skin is warm. Capillary refill takes less than 2 seconds.     ED Treatments / Results  Labs (all labs ordered are listed, but only abnormal results are displayed) Labs Reviewed  COMPREHENSIVE METABOLIC PANEL - Abnormal; Notable for the following:       Result Value   Glucose, Bld 151 (*)    BUN 29 (*)    Creatinine, Ser 1.76 (*)    Total Protein 6.0 (*)    Albumin 2.7 (*)    GFR calc non Af Amer 26 (*)    GFR calc Af Amer 30 (*)    All other components within normal limits  CBC WITH DIFFERENTIAL/PLATELET - Abnormal; Notable for the following:    WBC 21.2 (*)    Neutro Abs 18.7 (*)    Monocytes Absolute 1.1 (*)    All other components within normal limits  PROTIME-INR - Abnormal; Notable for the following:    Prothrombin Time 24.3 (*)    All other components within normal limits  CULTURE, BLOOD (ROUTINE X 2)  CULTURE, BLOOD (ROUTINE X 2)  URINE CULTURE  URINALYSIS, ROUTINE W REFLEX MICROSCOPIC  PROTIME-INR  I-STAT CG4 LACTIC ACID, ED    EKG  EKG Interpretation None       Radiology Dg Chest 2 View  Result Date: 02/25/2017 CLINICAL DATA:  Sepsis EXAM: CHEST  2 VIEW COMPARISON:  3247 FINDINGS: Stable mild right hemidiaphragm elevation. No airspace consolidation. No effusion. Normal pulmonary vasculature. IMPRESSION: No consolidation or effusion. Electronically Signed   By: Ellery Plunk M.D.   On: 02/25/2017 23:05    Procedures Procedures (including critical care time)  Medications Ordered in ED Medications  ceFEPIme (MAXIPIME) 2 g in dextrose 5 % 50 mL IVPB (not administered)   vancomycin (VANCOCIN) IVPB 1000 mg/200 mL premix (not administered)  ceFEPIme (MAXIPIME) 1 g in dextrose 5 %  50 mL IVPB (not administered)  sodium chloride 0.9 % bolus 500 mL (not administered)  vancomycin (VANCOCIN) IVPB 750 mg/150 ml premix (not administered)     Initial Impression / Assessment and Plan / ED Course  I have reviewed the triage vital signs and the nursing notes.  Pertinent labs & imaging results that were available during my care of the patient were reviewed by me and considered in my medical decision making (see chart for details).     Patient with fever. Large apparent cellulitis on left lateral leg. Lactic acid normal. Nursing home had said patient was altered but per family she is at her baseline now. White count is elevated. Will admit to hospitalist. Urine still pending and there is some abdominal tenderness. Both can be followed hospitalist  I reexamined patient now has some increasing erythema on the right lateral thigh. No real induration with that but it is warm. At site of apparent IV from nursing home.  Final Clinical Impressions(s) / ED Diagnoses   Final diagnoses:  Cellulitis of left lower extremity    New Prescriptions New Prescriptions   No medications on file     Benjiman CorePickering, Ayush Boulet, MD 02/25/17 [id number]    Benjiman CorePickering, Treyten Monestime, MD 02/25/17 [phone]

## 2017-02-25 NOTE — Progress Notes (Signed)
Pharmacy Antibiotic Note Clydene PughBonnie J Zeek is a 83 y.o. female admitted on 02/25/2017 with cellulitis that failed to respond to outpatient abx. Pharmacy has been consulted for cefepime and vancomycin dosing.  Plan: 1. Vancomycin 750 IV every 24 hours.  Goal trough 10-15 mcg/mL.  2. Cefepime 1 gram IV every 24 hours.   Weight: 213 lb (96.6 kg)  Temp (24hrs), Avg:101.4 F (38.6 C), Min:101.4 F (38.6 C), Max:101.4 F (38.6 C)   Recent Labs Lab 02/25/17 2155 02/25/17 2207  WBC 21.2*  --   CREATININE 1.76*  --   LATICACIDVEN  --  1.88    CrCl cannot be calculated (Unknown ideal weight.).    Allergies  Allergen Reactions  . Morphine     Hallucinations   . Ofloxacin     Unable to remember    Antimicrobials this admission: 7/23 cefepime >>  7/23 vancomycin >>   Microbiology results: 7/23 BCx: px  Thank you for allowing pharmacy to be a part of this patient's care.  Pollyann SamplesAndy Verdene Creson, PharmD, BCPS 02/25/2017, 10:36 PM

## 2017-02-25 NOTE — ED Notes (Signed)
Writer attempted to In and Out cath, unsuccessful, RN notified

## 2017-02-25 NOTE — ED Triage Notes (Signed)
Per [facility], Pt from [facility] in [place]. Pt is being treated for cellulitis to l thigh that started approximately 2 weeks ago per family. [facility] reports it has since spread further down past knee and up past L hip. Pt has redness to l hip to knee, warm to touch. Family reports pt has a blood clot to R leg, recently started on antibiotics. Pt has been receiving antibiotics. [facility] reports pt has had increased AMS. Pt currently alert and oriented to self only. Pt has hx of alzheimer's. Pt reports she just doesn't feel good. Pt had a temp of 104 axillary at [facility], was given 650 mg of tylenol at 1800.

## 2017-02-25 NOTE — ED Notes (Signed)
Patient transported to X-ray 

## 2017-02-26 ENCOUNTER — Encounter (HOSPITAL_COMMUNITY): Payer: Self-pay | Admitting: Internal Medicine

## 2017-02-26 DIAGNOSIS — L03116 Cellulitis of left lower limb: Secondary | ICD-10-CM

## 2017-02-26 DIAGNOSIS — A419 Sepsis, unspecified organism: Secondary | ICD-10-CM | POA: Diagnosis present

## 2017-02-26 LAB — CBC WITH DIFFERENTIAL/PLATELET
BASOS ABS: 0 10*3/uL (ref 0.0–0.1)
BASOS PCT: 0 %
EOS ABS: 0 10*3/uL (ref 0.0–0.7)
EOS PCT: 0 %
HCT: 37.5 % (ref 36.0–46.0)
Hemoglobin: 12.5 g/dL (ref 12.0–15.0)
Lymphocytes Relative: 5 %
Lymphs Abs: 1 10*3/uL (ref 0.7–4.0)
MCH: 33.7 pg (ref 26.0–34.0)
MCHC: 33.3 g/dL (ref 30.0–36.0)
MCV: 101.1 fL — ABNORMAL HIGH (ref 78.0–100.0)
MONO ABS: 1.2 10*3/uL — AB (ref 0.1–1.0)
Monocytes Relative: 6 %
Neutro Abs: 19.3 10*3/uL — ABNORMAL HIGH (ref 1.7–7.7)
Neutrophils Relative %: 89 %
PLATELETS: 155 10*3/uL (ref 150–400)
RBC: 3.71 MIL/uL — AB (ref 3.87–5.11)
RDW: 14.3 % (ref 11.5–15.5)
WBC: 20.8 10*3/uL — AB (ref 4.0–10.5)

## 2017-02-26 LAB — COMPREHENSIVE METABOLIC PANEL
ALBUMIN: 2.5 g/dL — AB (ref 3.5–5.0)
ALT: 20 U/L (ref 14–54)
AST: 23 U/L (ref 15–41)
Alkaline Phosphatase: 113 U/L (ref 38–126)
Anion gap: 7 (ref 5–15)
BUN: 27 mg/dL — AB (ref 6–20)
CHLORIDE: 109 mmol/L (ref 101–111)
CO2: 23 mmol/L (ref 22–32)
Calcium: 9.3 mg/dL (ref 8.9–10.3)
Creatinine, Ser: 1.65 mg/dL — ABNORMAL HIGH (ref 0.44–1.00)
GFR calc Af Amer: 32 mL/min — ABNORMAL LOW (ref >60)
GFR, EST NON AFRICAN AMERICAN: 28 mL/min — AB (ref >60)
Glucose, Bld: 176 mg/dL — ABNORMAL HIGH (ref 65–99)
POTASSIUM: 4 mmol/L (ref 3.5–5.1)
SODIUM: 139 mmol/L (ref 135–145)
Total Bilirubin: 0.6 mg/dL (ref 0.3–1.2)
Total Protein: 5.9 g/dL — ABNORMAL LOW (ref 6.5–8.1)

## 2017-02-26 LAB — GLUCOSE, CAPILLARY
GLUCOSE-CAPILLARY: 125 mg/dL — AB (ref 65–99)
GLUCOSE-CAPILLARY: 150 mg/dL — AB (ref 65–99)
GLUCOSE-CAPILLARY: 122 mg/dL — AB (ref 65–99)

## 2017-02-26 LAB — PROTIME-INR
INR: 2.33
Prothrombin Time: 25.9 seconds — ABNORMAL HIGH (ref 11.4–15.2)

## 2017-02-26 LAB — LACTIC ACID, PLASMA: LACTIC ACID, VENOUS: 2.2 mmol/L — AB (ref 0.5–1.9)

## 2017-02-26 LAB — PROCALCITONIN: Procalcitonin: 1.04 ng/mL

## 2017-02-26 LAB — APTT: APTT: 45 s — AB (ref 24–36)

## 2017-02-26 MED ORDER — ONDANSETRON HCL 4 MG/2ML IJ SOLN: 4.0000 mg | Freq: Four times a day (QID) | INTRAMUSCULAR | Status: DC | PRN

## 2017-02-26 MED ORDER — MEMANTINE HCL ER 28 MG PO CP24
28.0000 mg | ORAL_CAPSULE | Freq: Every day | ORAL | Status: DC
  Administered 2017-02-26 – 2017-03-01 (×4): 28 mg via ORAL
  Filled 2017-02-26 (×4): qty 1

## 2017-02-26 MED ORDER — SODIUM CHLORIDE 0.9 % IV SOLN
INTRAVENOUS | Status: DC
  Administered 2017-02-26 (×4): via INTRAVENOUS

## 2017-02-26 MED ORDER — POLYVINYL ALCOHOL 1.4 % OP SOLN
1.0000 [drp] | Freq: Four times a day (QID) | OPHTHALMIC | Status: DC
  Administered 2017-02-26 – 2017-03-01 (×12): 1 [drp] via OPHTHALMIC
  Filled 2017-02-26 (×2): qty 15

## 2017-02-26 MED ORDER — METOPROLOL SUCCINATE ER 100 MG PO TB24
100.0000 mg | ORAL_TABLET | Freq: Every day | ORAL | Status: DC
  Administered 2017-02-26 – 2017-03-01 (×4): 100 mg via ORAL
  Filled 2017-02-26 (×4): qty 1

## 2017-02-26 MED ORDER — ISOSORBIDE MONONITRATE ER 30 MG PO TB24
15.0000 mg | ORAL_TABLET | Freq: Every day | ORAL | Status: DC
  Administered 2017-02-26 – 2017-03-01 (×4): 15 mg via ORAL
  Filled 2017-02-26 (×4): qty 1

## 2017-02-26 MED ORDER — HYDROCODONE-ACETAMINOPHEN 5-325 MG PO TABS
1.0000 | ORAL_TABLET | Freq: Four times a day (QID) | ORAL | Status: DC | PRN
  Administered 2017-02-28 (×2): 1 via ORAL
  Filled 2017-02-26 (×2): qty 1

## 2017-02-26 MED ORDER — OMEGA-3-ACID ETHYL ESTERS 1 G PO CAPS
1.0000 g | ORAL_CAPSULE | Freq: Every day | ORAL | Status: DC
  Administered 2017-02-27 – 2017-02-28 (×2): 1 g via ORAL
  Filled 2017-02-26 (×4): qty 1

## 2017-02-26 MED ORDER — SODIUM CHLORIDE 0.9 % IV BOLUS (SEPSIS)
500.0000 mL | Freq: Once | INTRAVENOUS | Status: DC
  Administered 2017-02-26: 500 mL via INTRAVENOUS

## 2017-02-26 MED ORDER — INSULIN ASPART 100 UNIT/ML ~~LOC~~ SOLN
0.0000 [IU] | Freq: Three times a day (TID) | SUBCUTANEOUS | Status: DC
  Administered 2017-02-26 – 2017-03-01 (×7): 1 [IU] via SUBCUTANEOUS

## 2017-02-26 MED ORDER — ZINC OXIDE 20 % EX OINT
1.0000 "application " | TOPICAL_OINTMENT | CUTANEOUS | Status: DC | PRN
  Filled 2017-02-26: qty 28.35

## 2017-02-26 MED ORDER — PRO-STAT SUGAR FREE PO LIQD
30.0000 mL | Freq: Every day | ORAL | Status: DC
  Administered 2017-02-27 – 2017-03-01 (×3): 30 mL via ORAL
  Filled 2017-02-26 (×4): qty 30

## 2017-02-26 MED ORDER — VANCOMYCIN HCL IN DEXTROSE 1-5 GM/200ML-% IV SOLN
1000.0000 mg | INTRAVENOUS | Status: DC
  Administered 2017-02-26 – 2017-02-27 (×2): 1000 mg via INTRAVENOUS
  Filled 2017-02-26 (×2): qty 200

## 2017-02-26 MED ORDER — PRAVASTATIN SODIUM 20 MG PO TABS
10.0000 mg | ORAL_TABLET | Freq: Every day | ORAL | Status: DC
  Administered 2017-02-26 – 2017-02-28 (×3): 10 mg via ORAL
  Filled 2017-02-26 (×3): qty 1

## 2017-02-26 MED ORDER — MEMANTINE HCL-DONEPEZIL HCL ER 28-10 MG PO CP24: 1.0000 | ORAL_CAPSULE | Freq: Every day | ORAL | Status: DC

## 2017-02-26 MED ORDER — INSULIN DETEMIR 100 UNIT/ML ~~LOC~~ SOLN
30.0000 [IU] | Freq: Every day | SUBCUTANEOUS | Status: DC
  Filled 2017-02-26: qty 0.3

## 2017-02-26 MED ORDER — ASPIRIN 81 MG PO CHEW
81.0000 mg | CHEWABLE_TABLET | Freq: Every day | ORAL | Status: DC
  Administered 2017-02-26 – 2017-03-01 (×4): 81 mg via ORAL
  Filled 2017-02-26 (×4): qty 1

## 2017-02-26 MED ORDER — METHYLPHENIDATE HCL 5 MG PO TABS
10.0000 mg | ORAL_TABLET | ORAL | Status: DC
Start: 2017-02-26 — End: 2017-03-01
  Administered 2017-02-26 – 2017-03-01 (×4): 10 mg via ORAL
  Filled 2017-02-26 (×4): qty 2

## 2017-02-26 MED ORDER — GLUCERNA SHAKE PO LIQD
237.0000 mL | Freq: Two times a day (BID) | ORAL | Status: DC
  Administered 2017-02-27 – 2017-03-01 (×4): 237 mL via ORAL

## 2017-02-26 MED ORDER — LEVOTHYROXINE SODIUM 25 MCG PO TABS
25.0000 ug | ORAL_TABLET | Freq: Every day | ORAL | Status: DC
  Administered 2017-02-26 – 2017-03-01 (×4): 25 ug via ORAL
  Filled 2017-02-26 (×4): qty 1

## 2017-02-26 MED ORDER — ALBUTEROL SULFATE (2.5 MG/3ML) 0.083% IN NEBU: 2.5000 mg | INHALATION_SOLUTION | Freq: Three times a day (TID) | RESPIRATORY_TRACT | Status: DC | PRN

## 2017-02-26 MED ORDER — DONEPEZIL HCL 10 MG PO TABS
10.0000 mg | ORAL_TABLET | Freq: Every day | ORAL | Status: DC
  Administered 2017-02-26 – 2017-03-01 (×4): 10 mg via ORAL
  Filled 2017-02-26 (×4): qty 1

## 2017-02-26 MED ORDER — ACETAMINOPHEN 650 MG RE SUPP
650.0000 mg | Freq: Four times a day (QID) | RECTAL | Status: DC | PRN
  Administered 2017-02-27: 650 mg via RECTAL
  Filled 2017-02-26: qty 1

## 2017-02-26 MED ORDER — ONDANSETRON HCL 4 MG PO TABS: 4.0000 mg | ORAL_TABLET | Freq: Four times a day (QID) | ORAL | Status: DC | PRN

## 2017-02-26 MED ORDER — METOCLOPRAMIDE HCL 5 MG PO TABS
5.0000 mg | ORAL_TABLET | Freq: Two times a day (BID) | ORAL | Status: DC
  Administered 2017-02-26 – 2017-03-01 (×7): 5 mg via ORAL
  Filled 2017-02-26 (×7): qty 1

## 2017-02-26 MED ORDER — LACTULOSE 10 GM/15ML PO SOLN
45.0000 g | Freq: Every day | ORAL | Status: DC
  Administered 2017-02-27 – 2017-02-28 (×2): 45 g via ORAL
  Filled 2017-02-26 (×4): qty 90

## 2017-02-26 MED ORDER — ACETAMINOPHEN 325 MG PO TABS
650.0000 mg | ORAL_TABLET | ORAL | Status: DC | PRN
  Filled 2017-02-26: qty 2

## 2017-02-26 MED ORDER — HYPROMELLOSE (GONIOSCOPIC) 2.5 % OP SOLN
1.0000 [drp] | Freq: Four times a day (QID) | OPHTHALMIC | Status: DC
  Filled 2017-02-26: qty 15

## 2017-02-26 MED ORDER — WARFARIN SODIUM 4 MG PO TABS
4.0000 mg | ORAL_TABLET | ORAL | Status: DC
  Administered 2017-02-26: 4 mg via ORAL
  Filled 2017-02-26: qty 1

## 2017-02-26 MED ORDER — WARFARIN SODIUM 2 MG PO TABS
2.0000 mg | ORAL_TABLET | ORAL | Status: DC
  Administered 2017-02-26: 2 mg via ORAL
  Filled 2017-02-26: qty 1

## 2017-02-26 MED ORDER — WARFARIN - PHARMACIST DOSING INPATIENT
Freq: Every day | Status: DC
  Administered 2017-02-26: 18:00:00

## 2017-02-26 MED ORDER — SODIUM CHLORIDE 0.9 % IV BOLUS (SEPSIS)
500.0000 mL | Freq: Once | INTRAVENOUS | Status: AC
Start: 2017-02-26 — End: 2017-02-26
  Administered 2017-02-26: 500 mL via INTRAVENOUS

## 2017-02-26 MED ORDER — ACETAMINOPHEN 325 MG PO TABS
650.0000 mg | ORAL_TABLET | Freq: Four times a day (QID) | ORAL | Status: DC | PRN
  Administered 2017-02-26: 650 mg via ORAL

## 2017-02-26 NOTE — Progress Notes (Signed)
Paged on call provider at this time per orders; IVF bolus completed and VS obtained. Temp oral and axillary is 99.4072f

## 2017-02-26 NOTE — Progress Notes (Signed)
Pharmacy Antibiotic Note Andrea Mccoy is a 83 y.o. female admitted on 02/25/2017 with cellulitis that failed to respond to outpatient abx. Pharmacy has been consulted for cefepime and vancomycin dosing.  On broad spectrum abx for cellulitis of lower extremity. Tmax of 101.5 yesterday, WBC 20.8. SCr 1.65, normalized CrCl ~2730ml/min  Plan: Continue cefepime 1 gram IV every 24 hours Change vancomycin to 1g IV every 24 hours Monitor clinical picture, renal function, VT prn F/U C&S, abx deescalation / LOT  Height:  (UTA at this time) Weight: 226 lb 9.6 oz (102.8 kg)  Temp (24hrs), Avg:99.6 F (37.6 C), Min:97.3 F (36.3 C), Max:101.5 F (38.6 C)   Recent Labs Lab 02/25/17 2155 02/25/17 2207 02/26/17 0228  WBC 21.2*  --  20.8*  CREATININE 1.76*  --  1.65*  LATICACIDVEN  --  1.88 2.2*    CrCl cannot be calculated (Unknown ideal weight.).    Allergies  Allergen Reactions  . Morphine     Hallucinations   . Ofloxacin     Unable to remember    Antimicrobials this admission: 7/23 cefepime >>  7/23 vancomycin >>   Microbiology results: 7/23 BCx: px  Thank you for allowing pharmacy to be a part of this patient's care.  Andrea Mccoy Marysol Mccoy, PharmD, BCPS Clinical Pharmacist Pager [phone] 02/26/2017 10:06 AM

## 2017-02-26 NOTE — H&P (Signed)
History and Physical    Andrea RiedelBonnie J Kinderman ZOX:[id number] DOB: 1931-12-15 DOA: 02/25/2017  PCP: Jarome MatinPaterson, Daniel, MD  Patient coming from: Skilled nursing facility.  Chief Complaint: Fever and chills.  HPI: Andrea Mccoy is a 83 y.o. female with history of dementia, diabetes mellitus, hypertension, hypothyroidism, peripheral edema was brought to the ER after patient was found to have fever and chills at the nursing facility. Most of the history was obtained from ER physician.   ED Course: In the ER patient was found to be febrile with temperatures around 101F and tachycardic and tachypneic and labs showed leukocytosis. UA and chest x-ray were unremarkable. On exam patient has left lower extremity cellulitis on the thigh area. Patient is being admitted for sepsis from cellulitis. On exam patient appears lethargic and confused.  Review of Systems: As per HPI, rest all negative.   Past Medical History:  Diagnosis Date  . Allergic rhinitis   . Alzheimer's dementia   . Anemia   . Anxiety   . Arteriosclerotic heart disease   . Asthmatic bronchitis   . Calculus of gallbladder without cholecystitis without obstruction   . Cholelithiasis   . Chronic UTI   . Constipation   . Diverticulosis of colon   . DJD (degenerative joint disease)   . Gait abnormality   . GERD (gastroesophageal reflux disease)   . History of falling   . Hypercholesteremia   . Hyperlipidemia   . Hypertension   . Hypokalemia   . Hypothyroid   . Intracranial aneurysm   . Memory loss   . Multinodular goiter   . Nontoxic multinodular goiter   . TIA (transient ischemic attack)   . Venous insufficiency (chronic) (peripheral)   . Vitamin D deficiency     Past Surgical History:  Procedure Laterality Date  . ABDOMINAL HYSTERECTOMY    . bilateral THR's    . LAPAROSCOPIC CHOLECYSTECTOMY  01/2007   Dr. Odie SeraHoxsworth  . left femur fracture  04/2002   Dr. Ranell PatrickNorris  . left total hip    . LUMBAR FUSION    . right total  hip    . right total knee  11/1999   Dr. Leslee [place]     reports that she has never smoked. She has never used smokeless tobacco. She reports that she does not drink alcohol or use drugs.  Allergies  Allergen Reactions  . Morphine     Hallucinations   . Ofloxacin     Unable to remember    History reviewed. No pertinent family history.  Prior to Admission medications   Medication Sig Start Date End Date Taking? Authorizing Provider  acetaminophen (TYLENOL) 500 MG tablet Take 2 tablets (1,000 mg total) by mouth every 6 (six) hours as needed for moderate pain. 10/28/15  Yes Lyndal PulleyKnott, Daniel, MD  acetaminophen (TYLENOL) 650 MG CR tablet Take 650 mg by mouth every 4 (four) hours as needed for pain.   Yes [provider]  albuterol (PROVENTIL) (2.5 MG/3ML) 0.083% nebulizer solution Take 2.5 mg by nebulization 3 (three) times daily as needed for wheezing or shortness of breath.   Yes [provider]  Amino Acids-Protein Hydrolys (FEEDING SUPPLEMENT, PRO-STAT SUGAR FREE 64,) LIQD Take 30 mLs by mouth daily.   Yes [provider]  aspirin 81 MG chewable tablet Chew 81 mg by mouth daily.   Yes [provider]  Cholecalciferol (VITAMIN D) 2000 UNITS CAPS Take 1 capsule by mouth daily.   Yes [provider]  fish oil-omega-3  fatty acids 1000 MG capsule Take 1 g by mouth daily.     Yes [provider]  furosemide (LASIX) 40 MG tablet Take 1 tablet (40 mg total) by mouth daily. 1/2 tablet by mouth daily Patient taking differently: Take 40 mg by mouth 2 (two) times daily.  11/14/12 02/25/17 Yes Michele Mcalpine, MD  furosemide (LASIX) 40 MG tablet Take 0.5 tablets (20 mg total) by mouth daily after lunch. Patient taking differently: Take 40 mg by mouth daily.  10/28/15  Yes Lyndal Pulley, MD  HYDROcodone-acetaminophen (NORCO/VICODIN) 5-325 MG tablet Take 1 tablet by mouth every 6 (six) hours as needed for moderate pain.   Yes [provider]    hydroxypropyl methylcellulose / hypromellose (ISOPTO TEARS / GONIOVISC) 2.5 % ophthalmic solution Place 1 drop into both eyes 4 (four) times daily.   Yes [provider]  insulin aspart (NOVOLOG FLEXPEN) 100 UNIT/ML FlexPen Inject 0-12 Units into the skin 4 (four) times daily as needed for high blood sugar. Less than 150=0 units 151-200=4 units 201-250=6 units 251-300=8 units 301-350=10 units Greater than 351=12 units   Yes [provider]  insulin detemir (LEVEMIR) 100 UNIT/ML injection Inject 30 Units into the skin at bedtime.   Yes [provider]  isosorbide mononitrate (IMDUR) 30 MG 24 hr tablet Take 1/2 tablet by mouth once daily 11/14/12  Yes Michele Mcalpine, MD  lactulose [facility]) 10 GM/15ML solution Take 45 g by mouth daily.    Yes [provider]  levothyroxine (SYNTHROID, LEVOTHROID) 75 MCG tablet Take 1 tablet (75 mcg total) by mouth daily. Patient taking differently: Take 25 mcg by mouth daily.  11/14/12  Yes Michele Mcalpine, MD  lidocaine (XYLOCAINE) 2 % solution 20 mLs 2 (two) times daily. Apply to rectal area   Yes [provider]  Memantine HCl-Donepezil HCl (NAMZARIC) 28-10 MG CP24 Take 1 capsule by mouth daily.   Yes [provider]  methylphenidate (RITALIN) 10 MG tablet Take 10 mg by mouth every morning.   Yes [provider]  metoCLOPramide (REGLAN) 5 MG tablet Take 5 mg by mouth 2 (two) times daily.   Yes [provider]  metoprolol succinate (TOPROL-XL) 25 MG 24 hr tablet Take 100 mg by mouth daily.    Yes [provider]  Multiple Vitamins-Minerals (CENTRUM SILVER PO) Take 1 tablet by mouth daily.     Yes [provider]  potassium chloride SA (K-DUR,KLOR-CON) 20 MEQ tablet Take 1 tablet (20 mEq total) by mouth daily. Patient taking differently: Take 20 mEq by mouth 2 (two) times daily.  11/14/12 02/25/17 Yes Michele Mcalpine, MD  pravastatin (PRAVACHOL) 10 MG tablet Take 10 mg by mouth  at bedtime.   Yes [provider]  pseudoephedrine-dextromethorphan-guaifenesin (ROBITUSSIN-PE) 30-10-100 MG/5ML solution Take 10 mLs by mouth 3 (three) times daily as needed for cough.   Yes [provider]  spironolactone (ALDACTONE) 25 MG tablet Take 25 mg by mouth every Tuesday, Thursday, and Saturday at 6 PM.   Yes [provider]  warfarin (COUMADIN) 2 MG tablet Take 2-4 mg by mouth daily. Take 2 mg on Tuesday, Thursday Saturday and Sunday. Take 4 mg all other days of the week   Yes [provider]  witch hazel-glycerin (TUCKS) pad Apply 1 application topically daily as needed for hemorrhoids.   Yes [provider]  Zinc Oxide (DESITIN) 13 % CREA Apply 1 application topically See admin instructions. Each shift and as needed for skin irration  Yes [provider]    Physical Exam: Vitals:   02/25/17 2245 02/25/17 2300 02/26/17 0000 02/26/17 0057  BP: 135/72 (!) 132/97 (!) 112/50 (!) 128/58  Pulse: 63 75 89 89  Resp: (!) 21 (!) 23 (!) 22 (!) 22  Temp:    (!) 101.5 F (38.6 C)  TempSrc:    Oral  SpO2: 100% 100% 100% 94%  Weight:          Constitutional: Moderately built and nourished. Vitals:   02/25/17 2245 02/25/17 2300 02/26/17 0000 02/26/17 0057  BP: 135/72 (!) 132/97 (!) 112/50 (!) 128/58  Pulse: 63 75 89 89  Resp: (!) 21 (!) 23 (!) 22 (!) 22  Temp:    (!) 101.5 F (38.6 C)  TempSrc:    Oral  SpO2: 100% 100% 100% 94%  Weight:       Eyes: Anicteric no pallor. ENMT: No discharge from the ears eyes nose and mouth. Neck: No mass felt. No neck rigidity. Respiratory: No rhonchi or crepitations. Cardiovascular: S1-S2 heard no murmurs appreciated. Abdomen: Soft nontender bowel sounds present. No guarding or rigidity. Musculoskeletal: Left lower extremity around the thigh is erythematous. Skin: Erythema around the left eye. Neurologic: Patient is lethargic, arousable and follows commands. Psychiatric: Patient has  dementia.   Labs on Admission: I have personally reviewed following labs and imaging studies  CBC:  Recent Labs Lab 02/25/17 2155  WBC 21.2*  NEUTROABS 18.7*  HGB 13.0  HCT 39.3  MCV 100.0  PLT 178   Basic Metabolic Panel:  Recent Labs Lab 02/25/17 2155  NA 138  K 4.2  CL 106  CO2 25  GLUCOSE 151*  BUN 29*  CREATININE 1.76*  CALCIUM 9.8   GFR: CrCl cannot be calculated (Unknown ideal weight.). Liver Function Tests:  Recent Labs Lab 02/25/17 2155  AST 22  ALT 21  ALKPHOS 116  BILITOT 0.6  PROT 6.0*  ALBUMIN 2.7*   No results for input(s): LIPASE, AMYLASE in the last 168 hours. No results for input(s): AMMONIA in the last 168 hours. Coagulation Profile:  Recent Labs Lab 02/25/17 2155  INR 2.14   Cardiac Enzymes: No results for input(s): CKTOTAL, CKMB, CKMBINDEX, TROPONINI in the last 168 hours. BNP (last 3 results) No results for input(s): PROBNP in the last 8760 hours. HbA1C: No results for input(s): HGBA1C in the last 72 hours. CBG: No results for input(s): GLUCAP in the last 168 hours. Lipid Profile: No results for input(s): CHOL, HDL, LDLCALC, TRIG, CHOLHDL, LDLDIRECT in the last 72 hours. Thyroid Function Tests: No results for input(s): TSH, T4TOTAL, FREET4, T3FREE, THYROIDAB in the last 72 hours. Anemia Panel: No results for input(s): VITAMINB12, FOLATE, FERRITIN, TIBC, IRON, RETICCTPCT in the last 72 hours. Urine analysis:    Component Value Date/Time   COLORURINE YELLOW 02/25/2017 2327   APPEARANCEUR CLEAR 02/25/2017 2327   LABSPEC 1.011 02/25/2017 2327   PHURINE 5.0 02/25/2017 2327   GLUCOSEU NEGATIVE 02/25/2017 2327   GLUCOSEU NEGATIVE 12/18/2010 1703   HGBUR NEGATIVE 02/25/2017 2327   BILIRUBINUR NEGATIVE 02/25/2017 2327   KETONESUR NEGATIVE 02/25/2017 2327   PROTEINUR NEGATIVE 02/25/2017 2327   UROBILINOGEN 0.2 12/18/2010 1703   NITRITE NEGATIVE 02/25/2017 2327   LEUKOCYTESUR NEGATIVE 02/25/2017 2327   Sepsis  Labs: @LABRCNTIP (procalcitonin:4,lacticidven:4) )No results found for this or any previous visit (from the past 240 hour(s)).   Radiological Exams on Admission: Dg Chest 2 View  Result Date: 02/25/2017 CLINICAL DATA:  Sepsis EXAM: CHEST  2 VIEW COMPARISON:  3247 FINDINGS: Stable mild right hemidiaphragm elevation. No airspace consolidation. No effusion. Normal pulmonary vasculature. IMPRESSION: No consolidation or effusion. Electronically Signed   By: Ellery Plunk M.D.   On: 02/25/2017 23:05    Assessment/Plan Principal Problem:   Sepsis (HCC) Active Problems:   Hypothyroidism   Essential hypertension   INTRACRANIAL ANEURYSM   CAD (coronary artery disease)   Cellulitis   Cellulitis of left lower extremity    1. Sepsis likely from left lower extremity cellulitis - patient has been placed on vancomycin and cefepime. Follow cultures. Check lactate and procalcitonin levels continue hydration and hold Lasix and spironolactone. 2. Diabetes mellitus type 2 on Levemir and sliding scale coverage. If patient does not eat well then may have to decrease Levemir dose. 3. Hypothyroidism on Synthroid. 4. History of DVT on Coumadin which will be dosed per pharmacy. 5. History of intracranial aneurysm. 6. Hyperlipidemia on statins. 7. Hypertension on metoprolol and Imdur. Holding of Lasix and spironolactone due to hydration.  Further detailed history and used to be obtained once patient's family available. CODE STATUS also needs to be verified.   DVT prophylaxis: Coumadin. Code Status: Full code.  Family Communication: No family available at this time.  Disposition Plan: Back to skilled nursing facility.  Consults called: None.  Admission status: Inpatient.    Eduard Clos MD [facility] Pager [phone].  If 7PM-7AM, please contact night-coverage [email or web address] Password [email or web address]  02/26/2017, 1:50 AM

## 2017-02-26 NOTE — Progress Notes (Signed)
CRITICAL VALUE ALERT  Critical Value: lactic acid 2.2   Provider Notified: (on call) K. Schorr, for [facility]  Orders Received/Actions taken: paged provider

## 2017-02-26 NOTE — Progress Notes (Signed)
ANTICOAGULATION CONSULT NOTE - Initial Consult  Pharmacy Consult for Warfarin  Indication: Hx VTE  Allergies  Allergen Reactions  . Morphine     Hallucinations   . Ofloxacin     Unable to remember    Patient Measurements: Height:  (UTA at this time) Weight: 218 lb (98.9 kg)  Vital Signs: Temp: 101.5 F (38.6 C) (07/24 0057) Temp Source: Oral (07/24 0057) BP: 128/58 (07/24 0057) Pulse Rate: 89 (07/24 0057)  Labs:  Recent Labs  02/25/17 2155  HGB 13.0  HCT 39.3  PLT 178  LABPROT 24.3*  INR 2.14  CREATININE 1.76*    CrCl cannot be calculated (Unknown ideal weight.).   Medical History: Past Medical History:  Diagnosis Date  . Allergic rhinitis   . Alzheimer's dementia   . Anemia   . Anxiety   . Arteriosclerotic heart disease   . Asthmatic bronchitis   . Calculus of gallbladder without cholecystitis without obstruction   . Cholelithiasis   . Chronic UTI   . Constipation   . Diverticulosis of colon   . DJD (degenerative joint disease)   . Gait abnormality   . GERD (gastroesophageal reflux disease)   . History of falling   . Hypercholesteremia   . Hyperlipidemia   . Hypertension   . Hypokalemia   . Hypothyroid   . Intracranial aneurysm   . Memory loss   . Multinodular goiter   . Nontoxic multinodular goiter   . TIA (transient ischemic attack)   . Venous insufficiency (chronic) (peripheral)   . Vitamin D deficiency    Assessment: 83 y/o F here with cellulitis/altered mental status, continuing PTA warfarin for history of VTE. INR is therapeutic at 2.14 on admit. CBC good. Noted renal dysfunction.   Goal of Therapy:  INR 2-3 Monitor platelets by anticoagulation protocol: Yes   Plan:  -Warfarin 4 mg MWF, 2 mg all other days -Daily PT/INR -Monitor for bleeding  Andrea Mccoy, Andrea Mccoy 02/26/2017,2:36 AM

## 2017-02-26 NOTE — ED Notes (Signed)
Pt's duaghter sts pt is on a dysphagia diet and does not feed herself.  This RN called inpatient RN to let her know.

## 2017-02-26 NOTE — Progress Notes (Signed)
ANTICOAGULATION CONSULT NOTE - Initial Consult  Pharmacy Consult for Warfarin  Indication: Hx VTE  Allergies  Allergen Reactions  . Morphine     Hallucinations   . Ofloxacin     Unable to remember    Patient Measurements: Height:  (UTA at this time) Weight: 226 lb 9.6 oz (102.8 kg)  Assessment: 83 y/o F here with cellulitis/altered mental status, on Coumadin 2mg  daily exc for 4mg  on MWF PTA for reported DVT. INR therapeutic on admission and up to 2.33 today. CBC stable.  Goal of Therapy:  INR 2-3 Monitor platelets by anticoagulation protocol: Yes   Plan:  Continue Coumadin 2mg  daily exc for 4mg  on MWF Monitor daily INR, CBC, s/s of bleed  Enzo BiNathan Bud Kaeser, PharmD, [facility] Clinical Pharmacist Pager [phone] 02/26/2017 10:07 AM

## 2017-02-26 NOTE — Progress Notes (Signed)
Reviewed orders and saw order for RN to call [facility]. This RN spoke w/ Verlon AuLeslie from RRT at this time to notify her of the order placed by the MD. Verlon AuLeslie states she will place patient on the sepsis list.

## 2017-02-26 NOTE — Progress Notes (Signed)
Initial Nutrition Assessment  DOCUMENTATION CODES:   Obesity unspecified  INTERVENTION:  Continue 30 ml Prostat po once daily, each supplement provides 100 kcal and 15 grams of protein.   Provide Glucerna Shake po BID, each supplement provides 220 kcal and 10 grams of protein.  NUTRITION DIAGNOSIS:   Increased nutrient needs related to wound healing as evidenced by estimated needs.  GOAL:   Patient will meet greater than or equal to 90% of their needs  MONITOR:   PO intake, Supplement acceptance, Labs, Weight trends, Diet advancement, Skin, I & O's  REASON FOR ASSESSMENT:   Low Braden    ASSESSMENT:   83 y.o. female with history of dementia, diabetes mellitus, hypertension, hypothyroidism, peripheral edema was brought to the ER after patient was found to have fever and chills at the nursing facility. Patient has left lower extremity cellulitis on the thigh area. Patient is being admitted for sepsis from cellulitis.  During time of visit, nurse tech was feeding pt lunch. Pt was confused. Per MD note daughter reports pt is usually confused. No family at bedside. RD unable to obtain most recent nutrition history. Meal completion has been 25-50%. RD to order nutritional supplements to aid in caloric and protein needs. Per Nurse tech, pt favors sweet drinks/food items.   Unable to complete Nutrition-Focused physical exam at this time.  Labs and medications reviewed.   Diet Order:  Diet heart healthy/carb modified Room service appropriate? Yes; Fluid consistency: Thin  Skin:  Wound (see comment) (Left lower extremity cellulitis)  Last BM:  Unknown  Height:   Ht Readings from Last 1 Encounters:  11/14/12 5\' 6"  (1.676 m)    Weight:   Wt Readings from Last 1 Encounters:  02/26/17 226 lb 9.6 oz (102.8 kg)    Ideal Body Weight:  59 kg  BMI:  Body mass index is 36.57 kg/m.  Estimated Nutritional Needs:   Kcal:  1750-1900  Protein:  80-95 grams  Fluid:  1.7 -1.9  L/day  EDUCATION NEEDS:   No education needs identified at this time  Roslyn SmilingStephanie Aroura Vasudevan, MS, RD, LDN Pager # [phone] After hours/ weekend pager # [phone]

## 2017-02-26 NOTE — Progress Notes (Signed)
Agree with note and HPI per Dr. [name]  84 [place] Known history of DVT  Developed a blood clot in RLE in the past 2 weeks    initially started giving IV abx for UTI and was using the L thigh as an IV access  Previously been on ASa and plavix--taken off plavix and placed on coumadin recently Known history of l lymphedema without arterial insufficiency  Supposed to be using compression garments TIA in 1996  Probable multi-infarct dementia on Aricept since 2009 Prior EGD i.e. to gastroenterology 2006 gastritis Prior hip surgery 2014 Cardiac cath 2003  Normal Myoview 2008  Admitted with fever and chills earlier this morning 101 degrees Fahrenheit tachycardic Leukocytosis of 20 Found to have right thigh IV placed probably on 7/22  Discussed with daughter who feels she is still sleepy however is usually pretty confused Is a DO NOT RESUSCITATE She has 2 areas on her left foot that will need attention--at her heel she has had and on the sole of her foot she has a lab that looks like it's fluid filled. I do not think these are infected and rather related to her underlying lymphedema He will continue antibiotics I would recommend close monitoring for now with labs in the morning and we will reassess her then

## 2017-02-27 ENCOUNTER — Inpatient Hospital Stay (HOSPITAL_COMMUNITY): Payer: Medicare Other

## 2017-02-27 DIAGNOSIS — I5033 Acute on chronic diastolic (congestive) heart failure: Secondary | ICD-10-CM

## 2017-02-27 DIAGNOSIS — L8990 Pressure ulcer of unspecified site, unspecified stage: Secondary | ICD-10-CM | POA: Insufficient documentation

## 2017-02-27 DIAGNOSIS — R238 Other skin changes: Secondary | ICD-10-CM

## 2017-02-27 LAB — URINE CULTURE: Culture: NO GROWTH

## 2017-02-27 LAB — BLOOD CULTURE ID PANEL (REFLEXED)
Acinetobacter baumannii: NOT DETECTED
CANDIDA KRUSEI: NOT DETECTED
CANDIDA PARAPSILOSIS: NOT DETECTED
Candida albicans: NOT DETECTED
Candida glabrata: NOT DETECTED
Candida tropicalis: NOT DETECTED
ESCHERICHIA COLI: NOT DETECTED
Enterobacter cloacae complex: NOT DETECTED
Enterobacteriaceae species: NOT DETECTED
Enterococcus species: NOT DETECTED
Haemophilus influenzae: NOT DETECTED
KLEBSIELLA OXYTOCA: NOT DETECTED
KLEBSIELLA PNEUMONIAE: NOT DETECTED
Listeria monocytogenes: NOT DETECTED
Methicillin resistance: DETECTED — AB
Neisseria meningitidis: NOT DETECTED
PROTEUS SPECIES: NOT DETECTED
Pseudomonas aeruginosa: NOT DETECTED
SERRATIA MARCESCENS: NOT DETECTED
STAPHYLOCOCCUS AUREUS BCID: NOT DETECTED
STAPHYLOCOCCUS SPECIES: DETECTED — AB
STREPTOCOCCUS PNEUMONIAE: NOT DETECTED
Streptococcus agalactiae: NOT DETECTED
Streptococcus pyogenes: NOT DETECTED
Streptococcus species: NOT DETECTED

## 2017-02-27 LAB — BASIC METABOLIC PANEL
ANION GAP: 5 (ref 5–15)
BUN: 26 mg/dL — ABNORMAL HIGH (ref 6–20)
CALCIUM: 9.3 mg/dL (ref 8.9–10.3)
CHLORIDE: 113 mmol/L — AB (ref 101–111)
CO2: 24 mmol/L (ref 22–32)
Creatinine, Ser: 1.28 mg/dL — ABNORMAL HIGH (ref 0.44–1.00)
GFR calc non Af Amer: 38 mL/min — ABNORMAL LOW (ref >60)
GFR, EST AFRICAN AMERICAN: 44 mL/min — AB (ref >60)
Glucose, Bld: 115 mg/dL — ABNORMAL HIGH (ref 65–99)
Potassium: 4 mmol/L (ref 3.5–5.1)
Sodium: 142 mmol/L (ref 135–145)

## 2017-02-27 LAB — CBC
HEMATOCRIT: 37.3 % (ref 36.0–46.0)
HEMOGLOBIN: 11.7 g/dL — AB (ref 12.0–15.0)
MCH: 31.6 pg (ref 26.0–34.0)
MCHC: 31.4 g/dL (ref 30.0–36.0)
MCV: 100.8 fL — ABNORMAL HIGH (ref 78.0–100.0)
Platelets: 178 10*3/uL (ref 150–400)
RBC: 3.7 MIL/uL — ABNORMAL LOW (ref 3.87–5.11)
RDW: 14.4 % (ref 11.5–15.5)
WBC: 15.9 10*3/uL — ABNORMAL HIGH (ref 4.0–10.5)

## 2017-02-27 LAB — PROTIME-INR
INR: 3.43
PROTHROMBIN TIME: 35.4 s — AB (ref 11.4–15.2)

## 2017-02-27 LAB — ECHOCARDIOGRAM COMPLETE: WEIGHTICAEL: 3625.6 [oz_av]

## 2017-02-27 LAB — GLUCOSE, CAPILLARY
Glucose-Capillary: 81 mg/dL (ref 65–99)
Glucose-Capillary: 145 mg/dL — ABNORMAL HIGH (ref 65–99)
Glucose-Capillary: 115 mg/dL — ABNORMAL HIGH (ref 65–99)

## 2017-02-27 MED ORDER — INSULIN DETEMIR 100 UNIT/ML ~~LOC~~ SOLN: 25.0000 [IU] | Freq: Every day | SUBCUTANEOUS | Status: DC

## 2017-02-27 NOTE — Consult Note (Addendum)
WOC Nurse wound consult note Reason for Consult: Consult requested for left foot.  Pt recently developed cellulitis to LLE and bulla have developed. Wound type: Left leg and foot are painful to touch with generalized edema and erythremia. Left anterior foot is very tight. Left plantar foot with clear-fluid filled blister; 8X10cm. Left 2nd and 3rd toes with clear-fluid filled blisters and intact skin; each is .6X.6cm; left in place. Left heel with stage 2 pressure injury; 6X8cm, clear-fluid filled blister Cleansed heel and plantar foot with alcohol swab and made a small puncture in the edge of each blister to reduce pressure.  Large amt yellow fluid drained, no odor. Intact skin remains over both locations. Pressure Injury POA: Yes Dressing procedure/placement/frequency: Float left heel to reduce pressure in Prevalon boot, which has been ordered.  Xeroform gauze to promote healing to plantar foot and heel.  ABD and gauze to absorb drainage.  Discussed plan of care with patient and family members at the bedside. Please re-consult if further assistance is needed.  Thank-you,  Cammie Mcgeeawn Johniece Hornbaker MSN, RN, CWOCN, FrederickWCN-AP, CNS [phone]

## 2017-02-27 NOTE — Plan of Care (Signed)
Problem: Education: Goal: Knowledge of [facility] Education information/materials will improve Outcome: Not Progressing POC reviewed with pt.- 7p-7a; pt. confused

## 2017-02-27 NOTE — Progress Notes (Signed)
PHARMACY - PHYSICIAN COMMUNICATION CRITICAL VALUE ALERT - BLOOD CULTURE IDENTIFICATION (BCID)  Results for orders placed or performed during the hospital encounter of 02/25/17  Blood Culture ID Panel (Reflexed) (Collected: 02/25/2017  9:55 PM)  Result Value Ref Range   Enterococcus species NOT DETECTED NOT DETECTED   Listeria monocytogenes NOT DETECTED NOT DETECTED   Staphylococcus species DETECTED (A) NOT DETECTED   Staphylococcus aureus NOT DETECTED NOT DETECTED   Methicillin resistance DETECTED (A) NOT DETECTED   Streptococcus species NOT DETECTED NOT DETECTED   Streptococcus agalactiae NOT DETECTED NOT DETECTED   Streptococcus pneumoniae NOT DETECTED NOT DETECTED   Streptococcus pyogenes NOT DETECTED NOT DETECTED   Acinetobacter baumannii NOT DETECTED NOT DETECTED   Enterobacteriaceae species NOT DETECTED NOT DETECTED   Enterobacter cloacae complex NOT DETECTED NOT DETECTED   Escherichia coli NOT DETECTED NOT DETECTED   Klebsiella oxytoca NOT DETECTED NOT DETECTED   Klebsiella pneumoniae NOT DETECTED NOT DETECTED   Proteus species NOT DETECTED NOT DETECTED   Serratia marcescens NOT DETECTED NOT DETECTED   Haemophilus influenzae NOT DETECTED NOT DETECTED   Neisseria meningitidis NOT DETECTED NOT DETECTED   Pseudomonas aeruginosa NOT DETECTED NOT DETECTED   Candida albicans NOT DETECTED NOT DETECTED   Candida glabrata NOT DETECTED NOT DETECTED   Candida krusei NOT DETECTED NOT DETECTED   Candida parapsilosis NOT DETECTED NOT DETECTED   Candida tropicalis NOT DETECTED NOT DETECTED    Name of physician (or Provider) Contacted: Text paged overnight PA, no response  Changes to prescribed antibiotics required: already on vancomycin, would D/C cefepime   Alinda Moneyony L Jaiel Saraceno 02/27/2017  5:20 AM

## 2017-02-27 NOTE — Progress Notes (Addendum)
PROGRESS NOTE  Andrea RiedelBonnie J Aslin  ZOX:[id number] DOB: 14-Oct-1931 DOA: 02/25/2017 PCP: Jarome MatinPaterson, Daniel, MD  Brief Narrative:   The patient is an 83 year old female who is a resident at [facility], with multi-infarct dementia, history of DVT which was diagnosed 2 weeks ago for which she was started on Coumadin who was also diagnosed with urinary tract infection 2 weeks ago and started on IV antibiotics. She has been on IV antibiotics for 14 days but had worsening fevers and was brought to the emergency department.  She was found to have a left lower extremity cellulitis in the thigh. And her blood culture on the date of admission is growing coag negative staph.    Assessment & Plan:   Principal Problem:   Sepsis (HCC) Active Problems:   Hypothyroidism   Essential hypertension   INTRACRANIAL ANEURYSM   CAD (coronary artery disease)   Cellulitis   Cellulitis of left lower extremity   Pressure injury of skin  Sepsis (fever, leukocytosis, tachycardia) secondary to cellulitis.  See only minimal 1cm area of erythema on left lateral thigh.  She also received an injection of lovenox and possibly an antibiotic two weeks ago.  She does not have symptoms of pneumonia.  Leukocytosis resolving.   -  Continue vancomycin -  D/c cefepime -  Repeat blood culture:  Initial culture PCR was coag negative staph, not MRSA! -  ECHO -  Continue to hold lasix and spironolactone today -  Tele:  Sinus brady and picking up frequent tremor.  No real tachycardia seen during my review of the strip  Bullae on left foot -   WOC consult -  Check CT to exclude underlying subtle abscess given MRSA bacteremia (noncontrast due to CKD)  Stage 2 pressure ulcer -  allevyn  Diabetes mellitus type 2, CBG well controlled and borderline hypoglycemic this morning -  Decrease levemir to 25 units -  Continue SSI  Hypothyroidism, stable, continue synthroid  DVT, on coumadin.  Currently bullae on left foot do  not appear necrotic and I do not see any other areas of necrosis -  Continue coumadin for now but stop immediately if any areas of necrosis appear  History of intracranial aneurysm  Hyperlipidemia, stable, on statin  Hypertension, stable, continue metoprolol and imdur -  Continue to hold lasix and spironolactone  Acute on CKD stage 3, creatinine trending down with IVF from 1.76 to 1.28  DVT prophylaxis:  Coumadin.  INR mildly supratherapeutic Code Status:  DNR Family Communication:  Patient, her daughter and two other family members present at bedside Disposition Plan:  Likely to SNF in a few days.   Consultants:   Anticipate ID consult once culture officially reported as MRSA  Procedures:  ECHO pending  Antimicrobials:  Anti-infectives    Start     Dose/Rate Route Frequency Ordered Stop   02/26/17 2200  ceFEPIme (MAXIPIME) 1 g in dextrose 5 % 50 mL IVPB  Status:  Discontinued     1 g 100 mL/hr over 30 Minutes Intravenous Every 24 hours 02/25/17 2233 02/27/17 1235   02/26/17 2000  vancomycin (VANCOCIN) IVPB 750 mg/150 ml premix  Status:  Discontinued     750 mg 150 mL/hr over 60 Minutes Intravenous Every 24 hours 02/25/17 2234 02/26/17 1005   02/26/17 2000  vancomycin (VANCOCIN) IVPB 1000 mg/200 mL premix     1,000 mg 200 mL/hr over 60 Minutes Intravenous Every 24 hours 02/26/17 1005     02/25/17 2230  ceFEPIme (  MAXIPIME) 2 g in dextrose 5 % 50 mL IVPB     2 g 100 mL/hr over 30 Minutes Intravenous  Once 02/25/17 2224 02/26/17 0006   02/25/17 2230  vancomycin (VANCOCIN) IVPB 1000 mg/200 mL premix     1,000 mg 200 mL/hr over 60 Minutes Intravenous  Once 02/25/17 2224 02/26/17 0130       Subjective:  Confused.  Unreliably reporting symptoms according to family.    Objective: Vitals:   02/26/17 1427 02/26/17 2245 02/27/17 0510 02/27/17 1500  BP: (!) 166/52 127/63 130/62 134/68  Pulse: 68 67 (!) 109 74  Resp: 19 18 18 18   Temp: 98 F (36.7 C) 98.2 F (36.8 C)  98.6 F (37 C) 98.4 F (36.9 C)  TempSrc: Tympanic Oral Oral Oral  SpO2: 100% 98% 100% 100%  Weight:        Intake/Output Summary (Last 24 hours) at 02/27/17 1845 Last data filed at 02/27/17 1600  Gross per 24 hour  Intake              320 ml  Output              900 ml  Net             -580 ml   Filed Weights   02/25/17 2215 02/26/17 0213 02/26/17 0556  Weight: 96.6 kg (213 lb) 98.9 kg (218 lb) 102.8 kg (226 lb 9.6 oz)    Examination:  General exam:  Adult female.  No acute distress.  HEENT:  NCAT, MMM Respiratory system: Clear to auscultation bilaterally Cardiovascular system: Regular rate and rhythm, normal S1/S2. No murmurs, rubs, gallops or clicks.  Warm extremities Gastrointestinal system: Normal active bowel sounds, soft, nondistended, nontender.   MSK:  Normal tone and bulk, mildly increased LLE edema.  Large tense bullous on plantar surface of foot and on dorsum of two toes.  Another tense bullous on back of left heel.  Mild erythema around the plantar bullous, but no signs of hemorrhage or necrosis.   Neuro:  Grossly moves all extremities    Data Reviewed: I have personally reviewed following labs and imaging studies  CBC:  Recent Labs Lab 02/25/17 2155 02/26/17 0228 02/27/17 0614  WBC 21.2* 20.8* 15.9*  NEUTROABS 18.7* 19.3*  --   HGB 13.0 12.5 11.7*  HCT 39.3 37.5 37.3  MCV 100.0 101.1* 100.8*  PLT 178 155 178   Basic Metabolic Panel:  Recent Labs Lab 02/25/17 2155 02/26/17 0228 02/27/17 0614  NA 138 139 142  K 4.2 4.0 4.0  CL 106 109 113*  CO2 25 23 24   GLUCOSE 151* 176* 115*  BUN 29* 27* 26*  CREATININE 1.76* 1.65* 1.28*  CALCIUM 9.8 9.3 9.3   GFR: CrCl cannot be calculated (Unknown ideal weight.). Liver Function Tests:  Recent Labs Lab 02/25/17 2155 02/26/17 0228  AST 22 23  ALT 21 20  ALKPHOS 116 113  BILITOT 0.6 0.6  PROT 6.0* 5.9*  ALBUMIN 2.7* 2.5*   No results for input(s): LIPASE, AMYLASE in the last 168 hours. No  results for input(s): AMMONIA in the last 168 hours. Coagulation Profile:  Recent Labs Lab 02/25/17 2155 02/26/17 0228 02/27/17 0614  INR 2.14 2.33 3.43   Cardiac Enzymes: No results for input(s): CKTOTAL, CKMB, CKMBINDEX, TROPONINI in the last 168 hours. BNP (last 3 results) No results for input(s): PROBNP in the last 8760 hours. HbA1C: No results for input(s): HGBA1C in the last 72 hours. CBG:  Recent Labs  Lab 02/26/17 0633 02/26/17 1219 02/26/17 2244 02/27/17 0741 02/27/17 1111  GLUCAP 125* 150* 122* 81 145*   Lipid Profile: No results for input(s): CHOL, HDL, LDLCALC, TRIG, CHOLHDL, LDLDIRECT in the last 72 hours. Thyroid Function Tests: No results for input(s): TSH, T4TOTAL, FREET4, T3FREE, THYROIDAB in the last 72 hours. Anemia Panel: No results for input(s): VITAMINB12, FOLATE, FERRITIN, TIBC, IRON, RETICCTPCT in the last 72 hours. Urine analysis:    Component Value Date/Time   COLORURINE YELLOW 02/25/2017 2327   APPEARANCEUR CLEAR 02/25/2017 2327   LABSPEC 1.011 02/25/2017 2327   PHURINE 5.0 02/25/2017 2327   GLUCOSEU NEGATIVE 02/25/2017 2327   GLUCOSEU NEGATIVE 12/18/2010 1703   HGBUR NEGATIVE 02/25/2017 2327   BILIRUBINUR NEGATIVE 02/25/2017 2327   KETONESUR NEGATIVE 02/25/2017 2327   PROTEINUR NEGATIVE 02/25/2017 2327   UROBILINOGEN 0.2 12/18/2010 1703   NITRITE NEGATIVE 02/25/2017 2327   LEUKOCYTESUR NEGATIVE 02/25/2017 2327   Sepsis Labs: @LABRCNTIP (procalcitonin:4,lacticidven:4)  ) Recent Results (from the past 240 hour(s))  Culture, blood (Routine x 2)     Status: None (Preliminary result)   Collection Time: 02/25/17  9:50 PM  Result Value Ref Range Status   Specimen Description BLOOD RIGHT ANTECUBITAL  Final   Special Requests   Final    BOTTLES DRAWN AEROBIC AND ANAEROBIC Blood Culture adequate volume   Culture  Setup Time   Final    GRAM POSITIVE COCCI IN CLUSTERS AEROBIC BOTTLE ONLY IDENTIFICATION TO FOLLOW    Culture   Final     GRAM POSITIVE COCCI CULTURE REINCUBATED FOR BETTER GROWTH    Report Status PENDING  Incomplete  Culture, blood (Routine x 2)     Status: None (Preliminary result)   Collection Time: 02/25/17  9:55 PM  Result Value Ref Range Status   Specimen Description BLOOD LEFT ANTECUBITAL  Final   Special Requests   Final    BOTTLES DRAWN AEROBIC AND ANAEROBIC Blood Culture adequate volume   Culture  Setup Time   Final    GRAM POSITIVE COCCI IN BOTH AEROBIC AND ANAEROBIC BOTTLES CRITICAL RESULT CALLED TO, READ BACK BY AND VERIFIED WITH: T RUDISILL, PHARMD 02/27/17 0344 L CHAMPION    Culture   Final    GRAM POSITIVE COCCI CULTURE REINCUBATED FOR BETTER GROWTH    Report Status PENDING  Incomplete  Blood Culture ID Panel (Reflexed)     Status: Abnormal   Collection Time: 02/25/17  9:55 PM  Result Value Ref Range Status   Enterococcus species NOT DETECTED NOT DETECTED Final   Listeria monocytogenes NOT DETECTED NOT DETECTED Final   Staphylococcus species DETECTED (A) NOT DETECTED Final    Comment: Methicillin (oxacillin) resistant coagulase negative staphylococcus. Possible blood culture contaminant (unless isolated from more than one blood culture draw or clinical case suggests pathogenicity). No antibiotic treatment is indicated for blood  culture contaminants. CRITICAL RESULT CALLED TO, READ BACK BY AND VERIFIED WITH: T RUDISILL, PHARMD 02/27/17 0344 L CHAMPION    Staphylococcus aureus NOT DETECTED NOT DETECTED Final   Methicillin resistance DETECTED (A) NOT DETECTED Final    Comment: CRITICAL RESULT CALLED TO, READ BACK BY AND VERIFIED WITH: T RUDISILL, PHARMD 02/27/17 0344 L CHAMPION    Streptococcus species NOT DETECTED NOT DETECTED Final   Streptococcus agalactiae NOT DETECTED NOT DETECTED Final   Streptococcus pneumoniae NOT DETECTED NOT DETECTED Final   Streptococcus pyogenes NOT DETECTED NOT DETECTED Final   Acinetobacter baumannii NOT DETECTED NOT DETECTED Final   Enterobacteriaceae  species NOT DETECTED NOT DETECTED  Final   Enterobacter cloacae complex NOT DETECTED NOT DETECTED Final   Escherichia coli NOT DETECTED NOT DETECTED Final   Klebsiella oxytoca NOT DETECTED NOT DETECTED Final   Klebsiella pneumoniae NOT DETECTED NOT DETECTED Final   Proteus species NOT DETECTED NOT DETECTED Final   Serratia marcescens NOT DETECTED NOT DETECTED Final   Haemophilus influenzae NOT DETECTED NOT DETECTED Final   Neisseria meningitidis NOT DETECTED NOT DETECTED Final   Pseudomonas aeruginosa NOT DETECTED NOT DETECTED Final   Candida albicans NOT DETECTED NOT DETECTED Final   Candida glabrata NOT DETECTED NOT DETECTED Final   Candida krusei NOT DETECTED NOT DETECTED Final   Candida parapsilosis NOT DETECTED NOT DETECTED Final   Candida tropicalis NOT DETECTED NOT DETECTED Final  Urine culture     Status: None   Collection Time: 02/25/17 11:27 PM  Result Value Ref Range Status   Specimen Description URINE, CATHETERIZED  Final   Special Requests NONE  Final   Culture NO GROWTH  Final   Report Status 02/27/2017 FINAL  Final      Radiology Studies: Dg Chest 2 View  Result Date: 02/25/2017 CLINICAL DATA:  Sepsis EXAM: CHEST  2 VIEW COMPARISON:  [id number] FINDINGS: Stable mild right hemidiaphragm elevation. No airspace consolidation. No effusion. Normal pulmonary vasculature. IMPRESSION: No consolidation or effusion. Electronically Signed   By: Ellery Plunk M.D.   On: 02/25/2017 23:05     Scheduled Meds: . aspirin  81 mg Oral Daily  . donepezil  10 mg Oral Daily  . feeding supplement (GLUCERNA SHAKE)  237 mL Oral BID BM  . feeding supplement (PRO-STAT SUGAR FREE 64)  30 mL Oral Daily  . insulin aspart  0-9 Units Subcutaneous TID WC  . insulin detemir  30 Units Subcutaneous QHS  . isosorbide mononitrate  15 mg Oral Daily  . lactulose  45 g Oral Daily  . levothyroxine  25 mcg Oral QAC breakfast  . memantine  28 mg Oral Daily  . methylphenidate  10 mg Oral BH-q7a  .  metoCLOPramide  5 mg Oral BID  . metoprolol succinate  100 mg Oral Daily  . omega-3 acid ethyl esters  1 g Oral Daily  . polyvinyl alcohol  1 drop Both Eyes QID  . pravastatin  10 mg Oral QHS  . Warfarin - Pharmacist Dosing Inpatient   Does not apply q1800   Continuous Infusions: . vancomycin 1,000 mg (02/26/17 2137)     LOS: 2 days    Time spent: 30 min    Renae Fickle, MD [facility] Pager [phone]  If 7PM-7AM, please contact night-coverage [email or web address] Password [facility] 02/27/2017, 6:45 PM

## 2017-02-27 NOTE — Progress Notes (Signed)
ANTICOAGULATION CONSULT NOTE - Initial Consult  Pharmacy Consult for Warfarin  Indication: Hx VTE  Allergies  Allergen Reactions  . Morphine     Hallucinations   . Ofloxacin     Unable to remember    Patient Measurements: Height:  (UTA at this time) Weight: 226 lb 9.6 oz (102.8 kg)  Assessment: 83 y/o F here with cellulitis/altered mental status, on Coumadin 2mg  daily exc for 4mg  on MWF PTA for reported DVT. INR therapeutic on admission but now up to 3.43 today after continuing home dose. CBC stable.  Goal of Therapy:  INR 2-3 Monitor platelets by anticoagulation protocol: Yes   Plan:  Hold Coumadin tonight Monitor daily INR, CBC, s/s of bleed  Enzo BiNathan Alfonse Garringer, PharmD, BCPS Clinical Pharmacist Pager [phone] 02/27/2017 8:21 AM

## 2017-02-27 NOTE — Progress Notes (Signed)
  Echocardiogram 2D Echocardiogram has been performed.  Andrea SavoyCasey N Ania Mccoy 02/27/2017, 3:51 PM

## 2017-02-28 ENCOUNTER — Inpatient Hospital Stay (HOSPITAL_COMMUNITY): Payer: Medicare Other

## 2017-02-28 DIAGNOSIS — E119 Type 2 diabetes mellitus without complications: Secondary | ICD-10-CM

## 2017-02-28 DIAGNOSIS — Z79899 Other long term (current) drug therapy: Secondary | ICD-10-CM

## 2017-02-28 DIAGNOSIS — R7881 Bacteremia: Secondary | ICD-10-CM

## 2017-02-28 DIAGNOSIS — F0390 Unspecified dementia without behavioral disturbance: Secondary | ICD-10-CM

## 2017-02-28 DIAGNOSIS — B957 Other staphylococcus as the cause of diseases classified elsewhere: Secondary | ICD-10-CM

## 2017-02-28 DIAGNOSIS — E039 Hypothyroidism, unspecified: Secondary | ICD-10-CM

## 2017-02-28 DIAGNOSIS — L0390 Cellulitis, unspecified: Secondary | ICD-10-CM

## 2017-02-28 DIAGNOSIS — I2510 Atherosclerotic heart disease of native coronary artery without angina pectoris: Secondary | ICD-10-CM

## 2017-02-28 LAB — BASIC METABOLIC PANEL
ANION GAP: 7 (ref 5–15)
BUN: 23 mg/dL — ABNORMAL HIGH (ref 6–20)
CALCIUM: 9.3 mg/dL (ref 8.9–10.3)
CO2: 22 mmol/L (ref 22–32)
Chloride: 114 mmol/L — ABNORMAL HIGH (ref 101–111)
Creatinine, Ser: 1.05 mg/dL — ABNORMAL HIGH (ref 0.44–1.00)
GFR calc Af Amer: 56 mL/min — ABNORMAL LOW (ref >60)
GFR calc non Af Amer: 48 mL/min — ABNORMAL LOW (ref >60)
GLUCOSE: 118 mg/dL — AB (ref 65–99)
Potassium: 3.8 mmol/L (ref 3.5–5.1)
Sodium: 143 mmol/L (ref 135–145)

## 2017-02-28 LAB — CBC
HCT: 35.9 % — ABNORMAL LOW (ref 36.0–46.0)
HEMOGLOBIN: 11.6 g/dL — AB (ref 12.0–15.0)
MCH: 32.4 pg (ref 26.0–34.0)
MCHC: 32.3 g/dL (ref 30.0–36.0)
MCV: 100.3 fL — ABNORMAL HIGH (ref 78.0–100.0)
Platelets: 206 10*3/uL (ref 150–400)
RBC: 3.58 MIL/uL — ABNORMAL LOW (ref 3.87–5.11)
RDW: 14.5 % (ref 11.5–15.5)
WBC: 10.1 10*3/uL (ref 4.0–10.5)

## 2017-02-28 LAB — GLUCOSE, CAPILLARY
GLUCOSE-CAPILLARY: 136 mg/dL — AB (ref 65–99)
Glucose-Capillary: 118 mg/dL — ABNORMAL HIGH (ref 65–99)
Glucose-Capillary: 163 mg/dL — ABNORMAL HIGH (ref 65–99)
Glucose-Capillary: 146 mg/dL — ABNORMAL HIGH (ref 65–99)

## 2017-02-28 LAB — PROTIME-INR
INR: 3.43
PROTHROMBIN TIME: 35.3 s — AB (ref 11.4–15.2)

## 2017-02-28 MED ORDER — DOXYCYCLINE HYCLATE 100 MG PO TABS: 100.0000 mg | ORAL_TABLET | Freq: Two times a day (BID) | ORAL | Status: DC

## 2017-02-28 MED ORDER — FUROSEMIDE 40 MG PO TABS
40.0000 mg | ORAL_TABLET | Freq: Two times a day (BID) | ORAL | Status: DC
  Administered 2017-02-28 – 2017-03-01 (×2): 40 mg via ORAL
  Filled 2017-02-28 (×2): qty 1

## 2017-02-28 MED ORDER — SPIRONOLACTONE 25 MG PO TABS
25.0000 mg | ORAL_TABLET | Freq: Every day | ORAL | Status: DC
Start: 2017-02-28 — End: 2017-03-01
  Administered 2017-02-28 – 2017-03-01 (×2): 25 mg via ORAL
  Filled 2017-02-28 (×2): qty 1

## 2017-02-28 MED ORDER — VANCOMYCIN HCL IN DEXTROSE 1-5 GM/200ML-% IV SOLN
1000.0000 mg | INTRAVENOUS | Status: DC
  Administered 2017-02-28: 1000 mg via INTRAVENOUS
  Filled 2017-02-28 (×2): qty 200

## 2017-02-28 NOTE — Social Work (Signed)
CSW following case. Pt still receiving medical care. Per, Dr. Malachi BondsShort, possible DC tomorrow depending on cultures and mri. Possible dc with a medication that is expensive. CSW will f/u with SNF if there are any issues.  [facility]  Keene BreathPatricia Onesimo Lingard, LCSW Clinical Social Worker [phone]

## 2017-02-28 NOTE — Consult Note (Signed)
           [facility]  02/28/2017  Andrea RiedelBonnie J Mccoy 08/14/1931 [id number]   Went to see patient at the bedsideto identify possible discharge needs but staff reports she is getting ready to transport patient to Vascular Lab for ultrasound.   Per MD note, patient is a resident at [facility] and with multi-infarct dementia. Patient was admitted and being treated for a left lower extremity cellulitis in her thigh.  Plan is for patient to go back to skilled nursing facility ([facility]) at discharge.  No further health management needs noted at this point.  For questions, please contact:  Wyatt HasteLorraine Wonda Goodgame, BSN, RN- [facility] Primary Care Navigator  Telephone: [phone] [facility]

## 2017-02-28 NOTE — Consult Note (Addendum)
[facility]       Reason for Consult: CoNS bacteremia    Referring Physician: Dr. Malachi BondsShort  Principal Problem:   Sepsis [facility](HCC) Active Problems:   Hypothyroidism   Essential hypertension   INTRACRANIAL ANEURYSM   CAD (coronary artery disease)   Cellulitis   Cellulitis of left lower extremity   Pressure injury of skin   . aspirin  81 mg Oral Daily  . donepezil  10 mg Oral Daily  . feeding supplement (GLUCERNA SHAKE)  237 mL Oral BID BM  . feeding supplement (PRO-STAT SUGAR FREE 64)  30 mL Oral Daily  . furosemide  40 mg Oral BID  . insulin aspart  0-9 Units Subcutaneous TID WC  . isosorbide mononitrate  15 mg Oral Daily  . lactulose  45 g Oral Daily  . levothyroxine  25 mcg Oral QAC breakfast  . memantine  28 mg Oral Daily  . methylphenidate  10 mg Oral BH-q7a  . metoCLOPramide  5 mg Oral BID  . metoprolol succinate  100 mg Oral Daily  . omega-3 acid ethyl esters  1 g Oral Daily  . polyvinyl alcohol  1 drop Both Eyes QID  . pravastatin  10 mg Oral QHS  . spironolactone  25 mg Oral Daily    Recommendations: Continue vancomycin Follow repeat blood cultures Can use oral linezolid at discharge for 5 more days at discharge for cellulitis Repeat blood culture if she develops a fever after completion of linezolid   Assessment: She has CoNS bacteremia in 2/2 cultures associated with recent rash.  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   Bullous area - ? Pemphigus, or similar rash.  Can be associated with medications such as furosemide, spironalactone.  These though are not new drugs for her.     Antibiotics: Vancomycin day 4  HPI: Andrea Mccoy is a 83 y.o. female with dementia, DM, peripheral edema being treated at NH for presumed UTI with IV ceftriaxone was sent in with fever, tachypnea and leukocytosis to 21,000.  Also recently with a DVT and on coumadin.  Received IV ceftriaxone for about  2 weeks but fever persisted.  Noted lower extremity cellulitis and blistering area on foot.  No associated n/v.  She does complain of bilateral knee pain but unclear if it is new.  No hip pain expressed by her.    Review of Systems:  Constitutional: negative for fevers, chills, malaise and anorexia Respiratory: negative for cough or sputum Gastrointestinal: negative for nausea and diarrhea All other systems reviewed and are negative    Past Medical History:  Diagnosis Date  . Allergic rhinitis   . Alzheimer's dementia   . Anemia   . Anxiety   . Arteriosclerotic heart disease   . Asthmatic bronchitis   . Calculus of gallbladder without cholecystitis without obstruction   . Cholelithiasis   . Chronic UTI   . Constipation   . Diverticulosis of colon   . DJD (degenerative joint disease)   . Gait abnormality   . GERD (gastroesophageal reflux disease)   . History of falling   . Hypercholesteremia   . Hyperlipidemia   . Hypertension   . Hypokalemia   . Hypothyroid   . Intracranial aneurysm   . Memory loss   . Multinodular goiter   . Nontoxic multinodular goiter   . TIA (transient ischemic attack)   . Venous insufficiency (chronic) (peripheral)   .  Vitamin D deficiency     Social History  Substance Use Topics  . Smoking status: Never Smoker  . Smokeless tobacco: Never Used  . Alcohol use No    FMH: + cardiac disease  Allergies  Allergen Reactions  . Morphine     Hallucinations   . Ofloxacin     Unable to remember    Physical Exam: Constitutional: in no apparent distress and alert Vitals:   02/28/17 0500 02/28/17 0842  BP: 140/78 135/75  Pulse: 71 65  Resp: 20   Temp: 98.8 F (37.1 C)    EYES: anicteric ENMT: no thrush Cardiovascular: Cor RRR Respiratory: CTA B; normal respiratory effort GI: Bowel sounds are normal, liver is not enlarged, spleen is not enlarged,soft, nt Musculoskeletal: no pedal edema noted Skin: foot wrapped so I did not examine, no  rash Neuro: not oriented  Lab Results  Component Value Date   WBC 10.1 02/28/2017   HGB 11.6 (L) 02/28/2017   HCT 35.9 (L) 02/28/2017   MCV 100.3 (H) 02/28/2017   PLT 206 02/28/2017    Lab Results  Component Value Date   CREATININE 1.05 (H) 02/28/2017   BUN 23 (H) 02/28/2017   NA 143 02/28/2017   K 3.8 02/28/2017   CL 114 (H) 02/28/2017   CO2 22 02/28/2017    Lab Results  Component Value Date   ALT 20 02/26/2017   AST 23 02/26/2017   ALKPHOS 113 02/26/2017     Microbiology: Recent Results (from the past 240 hour(s))  Culture, blood (Routine x 2)     Status: None (Preliminary result)   Collection Time: 02/25/17  9:50 PM  Result Value Ref Range Status   Specimen Description BLOOD RIGHT ANTECUBITAL  Final   Special Requests   Final    BOTTLES DRAWN AEROBIC AND ANAEROBIC Blood Culture adequate volume   Culture  Setup Time   Final    GRAM POSITIVE COCCI IN CLUSTERS AEROBIC BOTTLE ONLY IDENTIFICATION TO FOLLOW    Culture   Final    GRAM POSITIVE COCCI CULTURE REINCUBATED FOR BETTER GROWTH    Report Status PENDING  Incomplete  Culture, blood (Routine x 2)     Status: None (Preliminary result)   Collection Time: 02/25/17  9:55 PM  Result Value Ref Range Status   Specimen Description BLOOD LEFT ANTECUBITAL  Final   Special Requests   Final    BOTTLES DRAWN AEROBIC AND ANAEROBIC Blood Culture adequate volume   Culture  Setup Time   Final    GRAM POSITIVE COCCI IN BOTH AEROBIC AND ANAEROBIC BOTTLES CRITICAL RESULT CALLED TO, READ BACK BY AND VERIFIED WITH: T RUDISILL, PHARMD 02/27/17 0344 L CHAMPION    Culture   Final    GRAM POSITIVE COCCI CULTURE REINCUBATED FOR BETTER GROWTH    Report Status PENDING  Incomplete  Blood Culture ID Panel (Reflexed)     Status: Abnormal   Collection Time: 02/25/17  9:55 PM  Result Value Ref Range Status   Enterococcus species NOT DETECTED NOT DETECTED Final   Listeria monocytogenes NOT DETECTED NOT DETECTED Final   Staphylococcus  species DETECTED (A) NOT DETECTED Final    Comment: Methicillin (oxacillin) resistant coagulase negative staphylococcus. Possible blood culture contaminant (unless isolated from more than one blood culture draw or clinical case suggests pathogenicity). No antibiotic treatment is indicated for blood  culture contaminants. CRITICAL RESULT CALLED TO, READ BACK BY AND VERIFIED WITH: T RUDISILL, PHARMD 02/27/17 0344 L CHAMPION    Staphylococcus aureus  NOT DETECTED NOT DETECTED Final   Methicillin resistance DETECTED (A) NOT DETECTED Final    Comment: CRITICAL RESULT CALLED TO, READ BACK BY AND VERIFIED WITH: T RUDISILL, PHARMD 02/27/17 0344 L CHAMPION    Streptococcus species NOT DETECTED NOT DETECTED Final   Streptococcus agalactiae NOT DETECTED NOT DETECTED Final   Streptococcus pneumoniae NOT DETECTED NOT DETECTED Final   Streptococcus pyogenes NOT DETECTED NOT DETECTED Final   Acinetobacter baumannii NOT DETECTED NOT DETECTED Final   Enterobacteriaceae species NOT DETECTED NOT DETECTED Final   Enterobacter cloacae complex NOT DETECTED NOT DETECTED Final   Escherichia coli NOT DETECTED NOT DETECTED Final   Klebsiella oxytoca NOT DETECTED NOT DETECTED Final   Klebsiella pneumoniae NOT DETECTED NOT DETECTED Final   Proteus species NOT DETECTED NOT DETECTED Final   Serratia marcescens NOT DETECTED NOT DETECTED Final   Haemophilus influenzae NOT DETECTED NOT DETECTED Final   Neisseria meningitidis NOT DETECTED NOT DETECTED Final   Pseudomonas aeruginosa NOT DETECTED NOT DETECTED Final   Candida albicans NOT DETECTED NOT DETECTED Final   Candida glabrata NOT DETECTED NOT DETECTED Final   Candida krusei NOT DETECTED NOT DETECTED Final   Candida parapsilosis NOT DETECTED NOT DETECTED Final   Candida tropicalis NOT DETECTED NOT DETECTED Final  Urine culture     Status: None   Collection Time: 02/25/17 11:27 PM  Result Value Ref Range Status   Specimen Description URINE, CATHETERIZED  Final    Special Requests NONE  Final   Culture NO GROWTH  Final   Report Status 02/27/2017 FINAL  Final    Staci Righter, MD [facility] [email or web address] [phone] pager  [phone] cell 02/28/2017, 11:33 AM

## 2017-02-28 NOTE — Progress Notes (Signed)
ANTICOAGULATION CONSULT NOTE - Initial Consult  Pharmacy Consult for Eliquis Indication: Hx VTE  Allergies  Allergen Reactions  . Morphine     Hallucinations   . Ofloxacin     Unable to remember    Patient Measurements: Height:  (UTA at this time) Weight: 228 lb 6.4 oz (103.6 kg)  Assessment: 83 y/o F here with cellulitis/altered mental status, on Coumadin 2mg  daily exc for 4mg  on MWF PTA for acute RLE DVT diagnosed as outpatient 2 weeks prior.   INR is supratherapeutic at 3.43 today and per discussion with Dr. Malachi BondsShort there is concern that bullae on feet may be associated with Coumadin therapy as timing of presentation fits and starting to discolor. Dr. Malachi BondsShort has consulted with dermatology and is stopping Coumadin to be safe. Placed Pharmacy consult to change patient to Eliquis when INR is < or = to 2.   SCr is currently trending down at 1.05 today. Discussed renal function with Dr. Malachi BondsShort and emphasized need to follow renal function closely on Eliquis therapy as outpatient.   Goal of Therapy:  INR 2-3 Monitor platelets by anticoagulation protocol: Yes   Plan:  No Eliquis tonight due to INR of 3.43.  Monitor daily INR for ability to start therapy, CBC, s/s of bleed Monitor SCr.  Link SnufferJessica Latricia Cerrito, PharmD, BCPS Clinical Pharmacist Clinical Phone 02/28/2017 until 3:30PM - [phone] After hours, please call #[phone] 02/28/2017 10:48 AM

## 2017-02-28 NOTE — Progress Notes (Signed)
PROGRESS NOTE  Andrea Mccoy  WUJ:[id number] DOB: 05-May-1932 DOA: 02/25/2017 PCP: Jarome Matin, MD  Brief Narrative:   The patient is an 83 year old female who is a resident at [facility], with multi-infarct dementia, history of DVT which was diagnosed 2 weeks ago for which she was started on Coumadin who was also diagnosed with urinary tract infection 2 weeks ago and started on IV antibiotics. She has been on IV ceftriaxone for 10 days but had worsening fevers and was brought to the emergency department.  She was found to have a left lower extremity cellulitis in the thigh. And her blood culture on the date of admission is growing coag negative staph.    Assessment & Plan:   Principal Problem:   Sepsis (HCC) Active Problems:   Hypothyroidism   Essential hypertension   INTRACRANIAL ANEURYSM   CAD (coronary artery disease)   Cellulitis   Cellulitis of left lower extremity   Pressure injury of skin  Sepsis (fever, leukocytosis, tachycardia) secondary to cellulitis.  See only minimal 1cm area of erythema on left lateral thigh.  She also received an injection of lovenox and possibly an antibiotic two weeks ago.  She does not have symptoms of pneumonia.  Leukocytosis resolving.   -  Continue vancomycin -  Initial culture PCR was coag negative staph -  ECHO:  Poor windows, unable to determine presence of vegetation -  Continue to hold lasix and spironolactone today -  Tele:  NSR, okay to d/c telemetry  CONS in 2/2 blood cultures from time of admission, likely bacteremic from associated cellulitis.   -  Epidermidis, sensitivities pending -  ID consultation -  Repeat BCx pending -  MRI left foot to eval for source of infection:  Soft tissue edema but no abscess -  Continue vancomycin and once ready for discharge, plan on changing to linezolid for 5 more days  Bullae on left foot, looks like bullous pemphigoid which can be associated with medications such as diuretics,  however, given time frame with coumadin having started two weeks ago, worried this may be an early sign of necrosis.   -  D/c coumadin -  Plan to start apixaban once INR is close to 2 -  Spoke with [facility] regarding findings and they recommended follow up in clinic for biopsy.  She does not need to stop her anticoagulation for her biopsy -  WOC consult appreciated -  May continue to drain with a 30G needle if needed for comfort but do not unroof  Stage 2 pressure ulcer -  allevyn  Diabetes mellitus type 2, CBG well controlled and borderline hypoglycemic this morning -  Continue SSI  Hypothyroidism, stable, continue synthroid  DVT, on coumadin.  Currently bullae on left foot do not appear necrotic and I do not see any other areas of necrosis -  D/c coumadin -  Daily INR -  Start apixaban once INR close to 2 -  Will need to follow creatinine carefully  History of intracranial aneurysm  Hyperlipidemia, stable, on statin  Hypertension, stable, continue metoprolol and imdur -  resume lasix and spironolactone  Acute on CKD stage 3, creatinine trending down with IVF from 1.76 to 1.28  DVT prophylaxis:  Coumadin.  INR mildly supratherapeutic Code Status:  DNR Family Communication:  Patient, her daughter and two other family members present at bedside Disposition Plan:  Likely to SNF tomorrow if second blood culture remains negative.     Consultants:   ID  consult  Procedures:  ECHO  MRI foot  Antimicrobials:  Anti-infectives    Start     Dose/Rate Route Frequency Ordered Stop   02/28/17 2200  vancomycin (VANCOCIN) IVPB 1000 mg/200 mL premix     1,000 mg 200 mL/hr over 60 Minutes Intravenous Every 24 hours 02/28/17 1045     02/28/17 1015  doxycycline (VIBRA-TABS) tablet 100 mg  Status:  Discontinued     100 mg Oral Every 12 hours 02/28/17 1008 02/28/17 1040   02/26/17 2200  ceFEPIme (MAXIPIME) 1 g in dextrose 5 % 50 mL IVPB  Status:  Discontinued     1 g 100  mL/hr over 30 Minutes Intravenous Every 24 hours 02/25/17 2233 02/27/17 1235   02/26/17 2000  vancomycin (VANCOCIN) IVPB 750 mg/150 ml premix  Status:  Discontinued     750 mg 150 mL/hr over 60 Minutes Intravenous Every 24 hours 02/25/17 2234 02/26/17 1005   02/26/17 2000  vancomycin (VANCOCIN) IVPB 1000 mg/200 mL premix  Status:  Discontinued     1,000 mg 200 mL/hr over 60 Minutes Intravenous Every 24 hours 02/26/17 1005 02/28/17 1007   02/25/17 2230  ceFEPIme (MAXIPIME) 2 g in dextrose 5 % 50 mL IVPB     2 g 100 mL/hr over 30 Minutes Intravenous  Once 02/25/17 2224 02/26/17 0006   02/25/17 2230  vancomycin (VANCOCIN) IVPB 1000 mg/200 mL premix     1,000 mg 200 mL/hr over 60 Minutes Intravenous  Once 02/25/17 2224 02/26/17 0130       Subjective:  Confused.  Denies chest pains, difficulty breathing, nausea, pains.  Denies being uncomfortable  Objective: Vitals:   02/27/17 2047 02/28/17 0500 02/28/17 0842 02/28/17 1355  BP: (!) 129/41 140/78 135/75 (!) 153/63  Pulse: 68 71 65 60  Resp: 20 20  18   Temp: 99.2 F (37.3 C) 98.8 F (37.1 C)  98.8 F (37.1 C)  TempSrc: Axillary Axillary  Tympanic  SpO2: 100% 100% 100% 100%  Weight:  103.6 kg (228 lb 6.4 oz)      Intake/Output Summary (Last 24 hours) at 02/28/17 1657 Last data filed at 02/28/17 1310  Gross per 24 hour  Intake              720 ml  Output             1303 ml  Net             -583 ml   Filed Weights   02/26/17 0213 02/26/17 0556 02/28/17 0500  Weight: 98.9 kg (218 lb) 102.8 kg (226 lb 9.6 oz) 103.6 kg (228 lb 6.4 oz)    Examination:  General exam:  Adult female, lying in bed, no acute distress HEENT: Normocephalic atraumatic, moist because membranes Respiratory system: Clear to auscultation bilaterally  Cardiovascular system: Regular rate and rhythm, no murmurs, rubs, or gallops, warm extremities Gastrointestinal system: NABS, soft, nondistended, nontender MSK:  2+ pitting edema of the buttocks which is  the deep tendon area. Mild injection of the skin over the edematous areas consistent with stasis dermatitis.  Left lateral thigh with a 1 cm darkened area without underlying fluctuance or surrounding bright erythema.  Large tense bullous on plantar surface of foot has recurred. Continues to have some erythema on the distal portion that extends into the toes with large vesicles on the plantar aspect of the second and third toes.  Bullous on the back of the left heel has been drained remains drained but skin beneath it  is starting to appear a little gray and there is some extension of vesicles along the superior margin of the previous vesicle. There is another small either skin tear were denuded bullae along the left calf with some hemorrhage and copious serous drainage.   Neuro:  Grossly moves all extremities    Data Reviewed: I have personally reviewed following labs and imaging studies  CBC:  Recent Labs Lab 02/25/17 2155 02/26/17 0228 02/27/17 0614 02/28/17 0402  WBC 21.2* 20.8* 15.9* 10.1  NEUTROABS 18.7* 19.3*  --   --   HGB 13.0 12.5 11.7* 11.6*  HCT 39.3 37.5 37.3 35.9*  MCV 100.0 101.1* 100.8* 100.3*  PLT 178 155 178 206   Basic Metabolic Panel:  Recent Labs Lab 02/25/17 2155 02/26/17 0228 02/27/17 0614 02/28/17 0402  NA 138 139 142 143  K 4.2 4.0 4.0 3.8  CL 106 109 113* 114*  CO2 25 23 24 22   GLUCOSE 151* 176* 115* 118*  BUN 29* 27* 26* 23*  CREATININE 1.76* 1.65* 1.28* 1.05*  CALCIUM 9.8 9.3 9.3 9.3   GFR: CrCl cannot be calculated (Unknown ideal weight.). Liver Function Tests:  Recent Labs Lab 02/25/17 2155 02/26/17 0228  AST 22 23  ALT 21 20  ALKPHOS 116 113  BILITOT 0.6 0.6  PROT 6.0* 5.9*  ALBUMIN 2.7* 2.5*   No results for input(s): LIPASE, AMYLASE in the last 168 hours. No results for input(s): AMMONIA in the last 168 hours. Coagulation Profile:  Recent Labs Lab 02/25/17 2155 02/26/17 0228 02/27/17 0614 02/28/17 0402  INR 2.14 2.33 3.43  3.43   Cardiac Enzymes: No results for input(s): CKTOTAL, CKMB, CKMBINDEX, TROPONINI in the last 168 hours. BNP (last 3 results) No results for input(s): PROBNP in the last 8760 hours. HbA1C: No results for input(s): HGBA1C in the last 72 hours. CBG:  Recent Labs Lab 02/27/17 1619 02/27/17 2342 02/28/17 0757 02/28/17 1150 02/28/17 1649  GLUCAP 115* 163* 118* 146* 136*   Lipid Profile: No results for input(s): CHOL, HDL, LDLCALC, TRIG, CHOLHDL, LDLDIRECT in the last 72 hours. Thyroid Function Tests: No results for input(s): TSH, T4TOTAL, FREET4, T3FREE, THYROIDAB in the last 72 hours. Anemia Panel: No results for input(s): VITAMINB12, FOLATE, FERRITIN, TIBC, IRON, RETICCTPCT in the last 72 hours. Urine analysis:    Component Value Date/Time   COLORURINE YELLOW 02/25/2017 2327   APPEARANCEUR CLEAR 02/25/2017 2327   LABSPEC 1.011 02/25/2017 2327   PHURINE 5.0 02/25/2017 2327   GLUCOSEU NEGATIVE 02/25/2017 2327   GLUCOSEU NEGATIVE 12/18/2010 1703   HGBUR NEGATIVE 02/25/2017 2327   BILIRUBINUR NEGATIVE 02/25/2017 2327   KETONESUR NEGATIVE 02/25/2017 2327   PROTEINUR NEGATIVE 02/25/2017 2327   UROBILINOGEN 0.2 12/18/2010 1703   NITRITE NEGATIVE 02/25/2017 2327   LEUKOCYTESUR NEGATIVE 02/25/2017 2327   Sepsis Labs: @LABRCNTIP (procalcitonin:4,lacticidven:4)  ) Recent Results (from the past 240 hour(s))  Culture, blood (Routine x 2)     Status: None (Preliminary result)   Collection Time: 02/25/17  9:50 PM  Result Value Ref Range Status   Specimen Description BLOOD RIGHT ANTECUBITAL  Final   Special Requests   Final    BOTTLES DRAWN AEROBIC AND ANAEROBIC Blood Culture adequate volume   Culture  Setup Time   Final    GRAM POSITIVE COCCI IN CLUSTERS AEROBIC BOTTLE ONLY IDENTIFICATION TO FOLLOW    Culture   Final    GRAM POSITIVE COCCI CULTURE REINCUBATED FOR BETTER GROWTH    Report Status PENDING  Incomplete  Culture, blood (Routine x  2)     Status: Abnormal  (Preliminary result)   Collection Time: 02/25/17  9:55 PM  Result Value Ref Range Status   Specimen Description BLOOD LEFT ANTECUBITAL  Final   Special Requests   Final    BOTTLES DRAWN AEROBIC AND ANAEROBIC Blood Culture adequate volume   Culture  Setup Time   Final    GRAM POSITIVE COCCI IN BOTH AEROBIC AND ANAEROBIC BOTTLES CRITICAL RESULT CALLED TO, READ BACK BY AND VERIFIED WITH: T RUDISILL, PHARMD 02/27/17 0344 L CHAMPION    Culture (A)  Final    STAPHYLOCOCCUS EPIDERMIDIS CULTURE REINCUBATED FOR BETTER GROWTH    Report Status PENDING  Incomplete  Blood Culture ID Panel (Reflexed)     Status: Abnormal   Collection Time: 02/25/17  9:55 PM  Result Value Ref Range Status   Enterococcus species NOT DETECTED NOT DETECTED Final   Listeria monocytogenes NOT DETECTED NOT DETECTED Final   Staphylococcus species DETECTED (A) NOT DETECTED Final    Comment: Methicillin (oxacillin) resistant coagulase negative staphylococcus. Possible blood culture contaminant (unless isolated from more than one blood culture draw or clinical case suggests pathogenicity). No antibiotic treatment is indicated for blood  culture contaminants. CRITICAL RESULT CALLED TO, READ BACK BY AND VERIFIED WITH: T RUDISILL, PHARMD 02/27/17 0344 L CHAMPION    Staphylococcus aureus NOT DETECTED NOT DETECTED Final   Methicillin resistance DETECTED (A) NOT DETECTED Final    Comment: CRITICAL RESULT CALLED TO, READ BACK BY AND VERIFIED WITH: T RUDISILL, PHARMD 02/27/17 0344 L CHAMPION    Streptococcus species NOT DETECTED NOT DETECTED Final   Streptococcus agalactiae NOT DETECTED NOT DETECTED Final   Streptococcus pneumoniae NOT DETECTED NOT DETECTED Final   Streptococcus pyogenes NOT DETECTED NOT DETECTED Final   Acinetobacter baumannii NOT DETECTED NOT DETECTED Final   Enterobacteriaceae species NOT DETECTED NOT DETECTED Final   Enterobacter cloacae complex NOT DETECTED NOT DETECTED Final   Escherichia coli NOT DETECTED  NOT DETECTED Final   Klebsiella oxytoca NOT DETECTED NOT DETECTED Final   Klebsiella pneumoniae NOT DETECTED NOT DETECTED Final   Proteus species NOT DETECTED NOT DETECTED Final   Serratia marcescens NOT DETECTED NOT DETECTED Final   Haemophilus influenzae NOT DETECTED NOT DETECTED Final   Neisseria meningitidis NOT DETECTED NOT DETECTED Final   Pseudomonas aeruginosa NOT DETECTED NOT DETECTED Final   Candida albicans NOT DETECTED NOT DETECTED Final   Candida glabrata NOT DETECTED NOT DETECTED Final   Candida krusei NOT DETECTED NOT DETECTED Final   Candida parapsilosis NOT DETECTED NOT DETECTED Final   Candida tropicalis NOT DETECTED NOT DETECTED Final  Urine culture     Status: None   Collection Time: 02/25/17 11:27 PM  Result Value Ref Range Status   Specimen Description URINE, CATHETERIZED  Final   Special Requests NONE  Final   Culture NO GROWTH  Final   Report Status 02/27/2017 FINAL  Final  Culture, blood (single) w Reflex to ID Panel     Status: None (Preliminary result)   Collection Time: 02/27/17  8:06 AM  Result Value Ref Range Status   Specimen Description BLOOD RIGHT ANTECUBITAL  Final   Special Requests   Final    BOTTLES DRAWN AEROBIC ONLY Blood Culture adequate volume   Culture NO GROWTH 1 DAY  Final   Report Status PENDING  Incomplete      Radiology Studies: Mr Foot Left Wo Contrast  Result Date: 02/28/2017 CLINICAL DATA:  Erythema left foot. EXAM: MRI OF THE LEFT FOOT  WITHOUT CONTRAST TECHNIQUE: Multiplanar, multisequence MR imaging of the left foot was performed. No intravenous contrast was administered. COMPARISON:  None. FINDINGS: Bones/Joint/Cartilage No marrow signal abnormality. No fracture or dislocation. Normal alignment. No joint effusion. Mild osteoarthritis of the first MTP joint. Ligaments Collateral ligaments are intact. Intact Lisfranc ligament. Anterior and posterior talofibular ligaments are intact. Anterior and posterior tibiofibular ligaments are  intact. Deltoid ligament is intact. Plantar fascia is intact. Muscles and Tendons Flexor, peroneal and extensor compartment tendons are intact. Achilles tendon is intact. Muscles are normal. Soft tissue No hematoma. No soft tissue mass. T2 hyperintense Skin blister along the plantar aspect of the forefoot measuring 6 x 1.7 x 6.4 cm. T2 hyperintense Small skin blister along the posterior aspect of the calcaneus measuring 3.8 x 0.7 x 0.5 cm. Generalized soft tissue edema throughout the dorsal aspect of the foot which can be seen with cellulitis. IMPRESSION: 1. Generalized soft tissue edema throughout the dorsal aspect of the foot which can be seen with cellulitis. 2. Skin blister along the plantar aspect of the forefoot measuring 6 x 1.7 x 6.4 cm. Small skin blister along the posterior aspect of the calcaneus measuring 3.8 x 0.7 x 0.5 cm. Electronically Signed   By: Elige KoHetal  Patel   On: 02/28/2017 12:24     Scheduled Meds: . aspirin  81 mg Oral Daily  . donepezil  10 mg Oral Daily  . feeding supplement (GLUCERNA SHAKE)  237 mL Oral BID BM  . feeding supplement (PRO-STAT SUGAR FREE 64)  30 mL Oral Daily  . furosemide  40 mg Oral BID  . insulin aspart  0-9 Units Subcutaneous TID WC  . isosorbide mononitrate  15 mg Oral Daily  . lactulose  45 g Oral Daily  . levothyroxine  25 mcg Oral QAC breakfast  . memantine  28 mg Oral Daily  . methylphenidate  10 mg Oral BH-[id number]  . metoCLOPramide  5 mg Oral BID  . metoprolol succinate  100 mg Oral Daily  . omega-3 acid ethyl esters  1 g Oral Daily  . polyvinyl alcohol  1 drop Both Eyes QID  . pravastatin  10 mg Oral QHS  . spironolactone  25 mg Oral Daily   Continuous Infusions: . vancomycin       LOS: 3 days    Time spent: 30 min    Renae FickleSHORT, Jarreau Callanan, MD [facility] Pager [phone]  If 7PM-7AM, please contact night-coverage [email or web address] Password [email or web address] 02/28/2017, 4:57 PM

## 2017-03-01 DIAGNOSIS — R7881 Bacteremia: Secondary | ICD-10-CM

## 2017-03-01 DIAGNOSIS — I10 Essential (primary) hypertension: Secondary | ICD-10-CM

## 2017-03-01 LAB — CULTURE, BLOOD (ROUTINE X 2)
Special Requests: ADEQUATE
Special Requests: ADEQUATE

## 2017-03-01 LAB — GLUCOSE, CAPILLARY
Glucose-Capillary: 109 mg/dL — ABNORMAL HIGH (ref 65–99)
Glucose-Capillary: 136 mg/dL — ABNORMAL HIGH (ref 65–99)

## 2017-03-01 LAB — CBC
HCT: 35.6 % — ABNORMAL LOW (ref 36.0–46.0)
Hemoglobin: 11.7 g/dL — ABNORMAL LOW (ref 12.0–15.0)
MCH: 33.2 pg (ref 26.0–34.0)
MCHC: 32.9 g/dL (ref 30.0–36.0)
MCV: 101.1 fL — AB (ref 78.0–100.0)
PLATELETS: 220 10*3/uL (ref 150–400)
RBC: 3.52 MIL/uL — ABNORMAL LOW (ref 3.87–5.11)
RDW: 14.8 % (ref 11.5–15.5)
WBC: 7.6 10*3/uL (ref 4.0–10.5)

## 2017-03-01 LAB — BASIC METABOLIC PANEL
Anion gap: 11 (ref 5–15)
BUN: 20 mg/dL (ref 6–20)
CHLORIDE: 112 mmol/L — AB (ref 101–111)
CO2: 19 mmol/L — AB (ref 22–32)
CREATININE: 1.02 mg/dL — AB (ref 0.44–1.00)
Calcium: 9.4 mg/dL (ref 8.9–10.3)
GFR calc Af Amer: 58 mL/min — ABNORMAL LOW (ref >60)
GFR calc non Af Amer: 50 mL/min — ABNORMAL LOW (ref >60)
Glucose, Bld: 101 mg/dL — ABNORMAL HIGH (ref 65–99)
Potassium: 3.9 mmol/L (ref 3.5–5.1)
SODIUM: 142 mmol/L (ref 135–145)

## 2017-03-01 LAB — PROTIME-INR
INR: 2.88
Prothrombin Time: 30.7 seconds — ABNORMAL HIGH (ref 11.4–15.2)

## 2017-03-01 MED ORDER — LINEZOLID 600 MG PO TABS: 600.0000 mg | ORAL_TABLET | Freq: Two times a day (BID) | ORAL | 0 refills | Status: AC

## 2017-03-01 MED ORDER — FUROSEMIDE 40 MG PO TABS: 40.0000 mg | ORAL_TABLET | Freq: Two times a day (BID) | ORAL | 0 refills | Status: AC

## 2017-03-01 MED ORDER — METHYLPHENIDATE HCL 10 MG PO TABS: 10.0000 mg | ORAL_TABLET | ORAL | 0 refills | Status: AC

## 2017-03-01 MED ORDER — LEVOTHYROXINE SODIUM 25 MCG PO TABS: 25.0000 ug | ORAL_TABLET | Freq: Every day | ORAL | 0 refills | Status: AC

## 2017-03-01 NOTE — Care Management Note (Signed)
Case Management Note  Patient Details  Name: Andrea Mccoy MRN: [id number] Date of Birth: [birth year]  Subjective/Objective:   Sepsis, HTN, cellulitis                  Action/Plan: Discharge Planning: Chart reviewed. NCM spoke to pt and dtr at bedside. Scheduled dc to SNF today. CSW following for SNF placement.   PCP Jarome MatinPATERSON, DANIEL MD  Expected Discharge Date:  03/01/17               Expected Discharge Plan:  Skilled Nursing Facility  In-House Referral:  Clinical Social Work  Discharge planning Services  CM Consult  Post Acute Care Choice:  NA Choice offered to:  NA  DME Arranged:  N/A DME Agency:  NA  HH Arranged:  NA HH Agency:  NA  Status of Service:  Completed, signed off  If discussed at Long Length of Stay Meetings, dates discussed:    Additional Comments:  Elliot CousinShavis, Corleone Biegler Ellen, RN 03/01/2017, 4:02 PM

## 2017-03-01 NOTE — NC FL2 (Signed)
[place] MEDICAID FL2 LEVEL OF CARE SCREENING TOOL     IDENTIFICATION  Patient Name: Andrea Mccoy Birthdate: [birth year] Sex: female Admission Date (Current Location): 02/25/2017  [facility] [place]   Facility and Address:         Provider Number: [id number]  Attending Physician Name and Address:  Renae FickleShort, Mackenzie, MD  Relative Name and Phone Number:  Juanetta GoslingRejeana Swain, daughter, [phone]  [phone] mobile    Current Level of Care: Hospital Recommended Level of Care: Skilled Nursing Facility Prior Approval Number:    Date Approved/Denied: 02/28/17 PASRR Number: [id number]  Discharge Plan: SNF    Current Diagnoses: Patient Active Problem List   Diagnosis Date Noted  . Bacteremia due to Gram-positive bacteria 03/01/2017  . Pressure injury of skin 02/27/2017  . Cellulitis of left lower extremity 02/26/2017  . Sepsis (HCC) 02/26/2017  . Cellulitis 02/25/2017  . CAD (coronary artery disease) 08/28/2011  . Dyslipidemia 08/28/2011  . Syncope 01/04/2011  . NONTOXIC MULTINODULAR GOITER 05/13/2010  . INTRACRANIAL ANEURYSM 05/13/2010  . VENOUS INSUFFICIENCY, CHRONIC 01/09/2010  . NUMBNESS 11/18/2008  . ALLERGIC RHINITIS 04-13-2009  . Hypothyroidism 05/25/2008  . ASTHMATIC BRONCHITIS, ACUTE 05/25/2008  . DIVERTICULOSIS OF COLON 09/23/2007  . MEMORY LOSS 09/23/2007  . TIA 09/03/2007  . HYPERCHOLESTEROLEMIA 07/16/2007  . ANEMIA 07/16/2007  . ANXIETY 07/16/2007  . Essential hypertension 07/16/2007  . ARTERIOSCLEROTIC HEART DISEASE 07/16/2007  . GERD 07/16/2007  . URINARY TRACT INFECTION, CHRONIC 07/16/2007  . DEGENERATIVE JOINT DISEASE 07/16/2007    Orientation RESPIRATION BLADDER Height & Weight     Self   (Nasal Canula 2 L) Incontinent, Indwelling catheter Weight: 228 lb 6.4 oz (103.6 kg) Height:   (UTA at this time)  BEHAVIORAL SYMPTOMS/MOOD NEUROLOGICAL BOWEL NUTRITION STATUS      Continent Diet (See DC Summary)  AMBULATORY  STATUS COMMUNICATION OF NEEDS Skin     Verbally Other (Comment) (left Lower extremity cellulitis in the thigh)                       Personal Care Assistance Level of Assistance              Functional Limitations Info             SPECIAL CARE FACTORS FREQUENCY                       Contractures      Additional Factors Info  Code Status, Allergies Code Status Info: DNR Allergies Info: MORPHINE, OFLOXACIN            Current Medications (03/01/2017):  This is the current hospital active medication list Current Facility-Administered Medications  Medication Dose Route Frequency Provider Last Rate Last Dose  . acetaminophen (TYLENOL) tablet 650 mg  650 mg Oral Q6H PRN Eduard ClosKakrakandy, Arshad N, MD   650 mg at 02/26/17 0155   Or  . acetaminophen (TYLENOL) suppository 650 mg  650 mg Rectal Q6H PRN Eduard ClosKakrakandy, Arshad N, MD   650 mg at 02/27/17 0905  . albuterol (PROVENTIL) (2.5 MG/3ML) 0.083% nebulizer solution 2.5 mg  2.5 mg Nebulization TID PRN Eduard ClosKakrakandy, Arshad N, MD      . aspirin chewable tablet 81 mg  81 mg Oral Daily Eduard ClosKakrakandy, Arshad N, MD   81 mg at 03/01/17 1003  . donepezil (ARICEPT) tablet 10 mg  10 mg Oral Daily Eduard ClosKakrakandy, Arshad N, MD   10 mg at 03/01/17 0947  . feeding supplement (  GLUCERNA SHAKE) (GLUCERNA SHAKE) liquid 237 mL  237 mL Oral BID BM Rhetta MuraSamtani, Jai-Gurmukh, MD   237 mL at 03/01/17 0948  . feeding supplement (PRO-STAT SUGAR FREE 64) liquid 30 mL  30 mL Oral Daily Eduard ClosKakrakandy, Arshad N, MD   30 mL at 03/01/17 0948  . furosemide (LASIX) tablet 40 mg  40 mg Oral BID Renae FickleShort, Mackenzie, MD   40 mg at 03/01/17 0947  . HYDROcodone-acetaminophen (NORCO/VICODIN) 5-325 MG per tablet 1 tablet  1 tablet Oral Q6H PRN Eduard ClosKakrakandy, Arshad N, MD   1 tablet at 02/28/17 1542  . insulin aspart (novoLOG) injection 0-9 Units  0-9 Units Subcutaneous TID WC Eduard ClosKakrakandy, Arshad N, MD   1 Units at 03/01/17 1213  . isosorbide mononitrate (IMDUR) 24 hr tablet 15 mg  15 mg  Oral Daily Eduard ClosKakrakandy, Arshad N, MD   15 mg at 03/01/17 0947  . lactulose (CHRONULAC) 10 GM/15ML solution 45 g  45 g Oral Daily Eduard ClosKakrakandy, Arshad N, MD   45 g at 02/28/17 0848  . levothyroxine (SYNTHROID, LEVOTHROID) tablet 25 mcg  25 mcg Oral QAC breakfast Eduard ClosKakrakandy, Arshad N, MD   25 mcg at 03/01/17 [id number]  . memantine (NAMENDA XR) 24 hr capsule 28 mg  28 mg Oral Daily Eduard ClosKakrakandy, Arshad N, MD   28 mg at 03/01/17 1003  . methylphenidate (RITALIN) tablet 10 mg  10 mg Oral Scharlene GlossBH-q7a Kakrakandy, Arshad N, MD   10 mg at 03/01/17 [id number]  . metoCLOPramide (REGLAN) tablet 5 mg  5 mg Oral BID Eduard ClosKakrakandy, Arshad N, MD   5 mg at 03/01/17 0948  . metoprolol succinate (TOPROL-XL) 24 hr tablet 100 mg  100 mg Oral Daily Eduard ClosKakrakandy, Arshad N, MD   100 mg at 03/01/17 0948  . omega-3 acid ethyl esters (LOVAZA) capsule 1 g  1 g Oral Daily Eduard ClosKakrakandy, Arshad N, MD   1 g at 02/28/17 0848  . ondansetron (ZOFRAN) tablet 4 mg  4 mg Oral Q6H PRN Eduard ClosKakrakandy, Arshad N, MD       Or  . ondansetron [facility](ZOFRAN) injection 4 mg  4 mg Intravenous Q6H PRN Eduard ClosKakrakandy, Arshad N, MD      . polyvinyl alcohol (LIQUIFILM TEARS) 1.4 % ophthalmic solution 1 drop  1 drop Both Eyes QID Eduard ClosKakrakandy, Arshad N, MD   1 drop at 03/01/17 1000  . pravastatin (PRAVACHOL) tablet 10 mg  10 mg Oral QHS Eduard ClosKakrakandy, Arshad N, MD   10 mg at 02/28/17 2305  . spironolactone (ALDACTONE) tablet 25 mg  25 mg Oral Daily Renae FickleShort, Mackenzie, MD   25 mg at 03/01/17 0948  . vancomycin (VANCOCIN) IVPB 1000 mg/200 mL premix  1,000 mg Intravenous Q24H Quenton FetterMillen, Jessica B, RPH   Stopped at 03/01/17 0005  . zinc oxide 20 % ointment 1 application  1 application Topical PRN Eduard ClosKakrakandy, Arshad N, MD         Discharge Medications: Please see discharge summary for a list of discharge medications.  Relevant Imaging Results:  Relevant Lab Results:   Additional Information SS#:[id number]  Emily Massar V Maurene Hollin, LCSW

## 2017-03-01 NOTE — Discharge Summary (Addendum)
Physician Discharge Summary  Andrea Mccoy ZOX:[id number] DOB: 12-11-1931 DOA: 02/25/2017  PCP: Jarome Matin, MD  Admit date: 02/25/2017 Discharge date: 03/01/2017  Admitted From: home  Disposition:  SNF  Recommendations for Outpatient Follow-up:  1. Follow up with [facility] ASAP.  Please call to schedule appointment regarding bullae on left foot 2. Linezolid through August 1st, then stop 3. If fever returns, repeat blood culture 4. Please check daily INR and BMP and start apixaban for acute DVT treatment once INR nears (but does not go below) 2.  Dose will depend on creatinine on day of initiation.  Do not resume coumadin until she is evaluated by Dermatology please. 5. Palliative care consult   Home Health:  Continue PT/OT  Equipment/Devices:  2L nasal canula.  Nonadherent dressings to left foot bullae covered with ABD or guaze to absorb moisture.  May prick bullae with large bore needle to drain for comfort if necessary but do not unroof.    Discharge Condition:  Stable, improved CODE STATUS:  DNR  Diet recommendation:  diabetic  Brief/Interim Summary:  The patient is an 83 year old female who is a resident at [facility], with multi-infarct dementia, history of DVT which was diagnosed 2 weeks ago for which she was started on Coumadin who was also diagnosed with urinary tract infection 2 weeks ago and started on IV ceftriaxone. She has been on ceftriaxone for 10 days but had worsening fevers and was brought to the emergency department.  She was found to have a left lower extremity cellulitis on the lateral thigh and a bullous rash on her left lower extremity.  She was started on broad spectrum antibiotics (vanc and cefepime) which were narrowed to monotherapy vancomycin after two of two blood cultures on the date of admission grew coag negative staph (Staph epidermidis).  ID was consulted.  TTE demonstrated no evidence of vegetation but there were poor  windows.  Fortunately, her repeat BCx has not grown bacteria.  She likely had transient bacteremia related to her cellulitis.  Continue linezolid for 5 more days after discharge, then stop.  Her bullae on her left foot do not appear necrotic, but the timing of when she started coumadin (two weeks ago) and onset of bullae is suspicious for warfarin-induced necrosis.  Her coumadin was stopped and she should start apixaban when her INR is near 2.    Discharge Diagnoses:  Principal Problem:   Sepsis (HCC) Active Problems:   Hypothyroidism   Essential hypertension   INTRACRANIAL ANEURYSM   CAD (coronary artery disease)   Cellulitis   Cellulitis of left lower extremity   Pressure injury of skin  Sepsis (fever, leukocytosis, tachycardia) secondary to cellulitis.  sepsis physiology resolved -  Continue linezolid through August 1st. -  Initial culture PCR was coag negative staph, methicillin resistant -  ECHO:  Poor windows, unable to determine presence of vegetation  CONS in 2/2 blood cultures from time of admission, likely bacteremic from associated cellulitis.   -  abx as above  -  ID consultation appreciated -  Repeat BCx negative to date -  MRI left foot to eval for source of infection:  Soft tissue edema but no abscess  Bullae on left foot, looks like bullous pemphigoid which can be associated with medications such as diuretics, however, given time frame with coumadin having started two weeks ago, worried this may be an early sign of necrosis.   -  D/c'd coumadin -  Plan to start apixaban once INR  is close to 2 -  Spoke with [facility] regarding findings and they recommended follow up in clinic for biopsy.  She does not need to stop her anticoagulation for her biopsy -  WOC consult appreciated -  May continue to drain by gently pricking with needle if needed for comfort but do not unroof  Stage 2 pressure ulcer on heel -  allevyn  Diabetes mellitus type 2, CBG well  controlled and borderline hypoglycemic this morning -  Continue SSI  Hypothyroidism, stable, continue synthroid  DVT, on coumadin.  Currently bullae on left foot do not appear necrotic and I do not see any other areas of necrosis -  D/c coumadin -  Daily INR -  Start apixaban once INR close to 2 -  Will need to follow creatinine carefully  History of intracranial aneurysm  Hyperlipidemia, stable, on statin  Hypertension, stable, continue metoprolol and imdur  Anasarca -  continue lasix and spironolactone  Acute on CKD stage 3, creatinine trending down with IVF from 1.76 to 1.02.    Peripheral arterial disease suggested on ABIs "Likely falsely elevated ABI. however, waveforms suggest no disease in right leg with moderate disease in left leg."  Consider vascular surgery referral if consistent with patient and family's goals of care.    Mild dementia, no agitation.    Discharge Instructions     Medication List    STOP taking these medications   HYDROcodone-acetaminophen 5-325 MG tablet Commonly known as:  NORCO/VICODIN   insulin detemir 100 UNIT/ML injection Commonly known as:  LEVEMIR   pseudoephedrine-dextromethorphan-guaifenesin 30-10-100 MG/5ML solution Commonly known as:  ROBITUSSIN-PE   warfarin 2 MG tablet Commonly known as:  COUMADIN     TAKE these medications   acetaminophen 650 MG CR tablet Commonly known as:  TYLENOL Take 650 mg by mouth every 4 (four) hours as needed for pain. What changed:  Another medication with the same name was removed. Continue taking this medication, and follow the directions you see here.   albuterol (2.5 MG/3ML) 0.083% nebulizer solution Commonly known as:  PROVENTIL Take 2.5 mg by nebulization 3 (three) times daily as needed for wheezing or shortness of breath.   aspirin 81 MG chewable tablet Chew 81 mg by mouth daily.   CENTRUM SILVER PO Take 1 tablet by mouth daily.   DESITIN 13 % Crea Generic drug:  Zinc  Oxide Apply 1 application topically See admin instructions. Each shift and as needed for skin irration   feeding supplement (PRO-STAT SUGAR FREE 64) Liqd Take 30 mLs by mouth daily.   fish oil-omega-3 fatty acids 1000 MG capsule Take 1 g by mouth daily.   furosemide 40 MG tablet Commonly known as:  LASIX Take 1 tablet (40 mg total) by mouth 2 (two) times daily. What changed:  when to take this  additional instructions  Another medication with the same name was removed. Continue taking this medication, and follow the directions you see here.   hydroxypropyl methylcellulose / hypromellose 2.5 % ophthalmic solution Commonly known as:  ISOPTO TEARS / GONIOVISC Place 1 drop into both eyes 4 (four) times daily.   isosorbide mononitrate 30 MG 24 hr tablet Commonly known as:  IMDUR Take 1/2 tablet by mouth once daily   lactulose 10 GM/15ML solution Commonly known as:  CHRONULAC Take 45 g by mouth daily.   levothyroxine 25 MCG tablet Commonly known as:  SYNTHROID, LEVOTHROID Take 1 tablet (25 mcg total) by mouth daily before breakfast. What changed:  medication strength  how much to take  when to take this   lidocaine 2 % solution Commonly known as:  XYLOCAINE 20 mLs 2 (two) times daily. Apply to rectal area   linezolid 600 MG tablet Commonly known as:  ZYVOX Take 1 tablet (600 mg total) by mouth 2 (two) times daily.   methylphenidate 10 MG tablet Commonly known as:  RITALIN Take 10 mg by mouth every morning.   metoCLOPramide 5 MG tablet Commonly known as:  REGLAN Take 5 mg by mouth 2 (two) times daily.   metoprolol succinate 25 MG 24 hr tablet Commonly known as:  TOPROL-XL Take 100 mg by mouth daily.   NAMZARIC 28-10 MG Cp24 Generic drug:  Memantine HCl-Donepezil HCl Take 1 capsule by mouth daily.   NOVOLOG FLEXPEN 100 UNIT/ML FlexPen Generic drug:  insulin aspart Inject 0-12 Units into the skin 4 (four) times daily as needed for high blood sugar. Less  than 150=0 units 151-200=4 units 201-250=6 units 251-300=8 units 301-350=10 units Greater than 351=12 units   potassium chloride SA 20 MEQ tablet Commonly known as:  K-DUR,KLOR-CON Take 1 tablet (20 mEq total) by mouth daily. What changed:  when to take this   pravastatin 10 MG tablet Commonly known as:  PRAVACHOL Take 10 mg by mouth at bedtime.   spironolactone 25 MG tablet Commonly known as:  ALDACTONE Take 25 mg by mouth every Tuesday, Thursday, and Saturday at 6 PM.   Vitamin D 2000 units Caps Take 1 capsule by mouth daily.   witch hazel-glycerin pad Commonly known as:  TUCKS Apply 1 application topically daily as needed for hemorrhoids.      Follow-up Information    [facility], [place]. Schedule an appointment as soon as possible for a visit in 1 week(s).   Contact information: [address] [place] [address] [phone]        Jarome Matin, MD Follow up.   Specialty:  Internal Medicine Contact information: [address] [place] [address] [phone]          Allergies  Allergen Reactions  . Morphine     Hallucinations   . Ofloxacin     Unable to remember    Consultations: ID   Procedures/Studies: Dg Chest 2 View  Result Date: 02/25/2017 CLINICAL DATA:  Sepsis EXAM: CHEST  2 VIEW COMPARISON:  [id number] FINDINGS: Stable mild right hemidiaphragm elevation. No airspace consolidation. No effusion. Normal pulmonary vasculature. IMPRESSION: No consolidation or effusion. Electronically Signed   By: Ellery Plunk M.D.   On: 02/25/2017 23:05   Mr Foot Left Wo Contrast  Result Date: 02/28/2017 CLINICAL DATA:  Erythema left foot. EXAM: MRI OF THE LEFT FOOT WITHOUT CONTRAST TECHNIQUE: Multiplanar, multisequence MR imaging of the left foot was performed. No intravenous contrast was administered. COMPARISON:  None. FINDINGS: Bones/Joint/Cartilage No marrow signal abnormality. No fracture or dislocation. Normal alignment. No joint  effusion. Mild osteoarthritis of the first MTP joint. Ligaments Collateral ligaments are intact. Intact Lisfranc ligament. Anterior and posterior talofibular ligaments are intact. Anterior and posterior tibiofibular ligaments are intact. Deltoid ligament is intact. Plantar fascia is intact. Muscles and Tendons Flexor, peroneal and extensor compartment tendons are intact. Achilles tendon is intact. Muscles are normal. Soft tissue No hematoma. No soft tissue mass. T2 hyperintense Skin blister along the plantar aspect of the forefoot measuring 6 x 1.7 x 6.4 cm. T2 hyperintense Small skin blister along the posterior aspect of the calcaneus measuring 3.8 x 0.7 x 0.5 cm. Generalized soft tissue  edema throughout the dorsal aspect of the foot which can be seen with cellulitis. IMPRESSION: 1. Generalized soft tissue edema throughout the dorsal aspect of the foot which can be seen with cellulitis. 2. Skin blister along the plantar aspect of the forefoot measuring 6 x 1.7 x 6.4 cm. Small skin blister along the posterior aspect of the calcaneus measuring 3.8 x 0.7 x 0.5 cm. Electronically Signed   By: Elige KoHetal  Patel   On: 02/28/2017 12:24    Subjective: Denies discomfort.  Denies shortness of breath, chest pains, lower extremity pain.  Discharge Exam: Vitals:   03/01/17 0530 03/01/17 0945  BP: 140/87 (!) 152/66  Pulse: 61 67  Resp:    Temp: 98.7 F (37.1 C) 98.7 F (37.1 C)   Vitals:   02/28/17 2100 03/01/17 0530 03/01/17 0945 03/01/17 1053  BP: (!) 151/60 140/87 (!) 152/66   Pulse: 61 61 67   Resp:      Temp: 98.7 F (37.1 C) 98.7 F (37.1 C) 98.7 F (37.1 C)   TempSrc: Axillary Axillary Axillary   SpO2: 100% 100% 99% 98%  Weight:        General: Pt is alert, awake, not in acute distress Cardiovascular: RRR, S1/S2 +, no rubs, no gallops Respiratory: CTA bilaterally, no wheezing, no rhonchi Abdominal: Soft, NT, ND, bowel sounds + Extremities: 2+ pitting edema in dependent areas such as the  buttocks, overall improved from prior.  Injection of the skin over the edematous areas consistent with stasis dermatitis has improved.  Left lateral thigh with a 1 cm darkened area has lightened and continues to have no fluctuance.  Large tense bullous on plantar surface of footwas drained by myself using a 20G needle. Continues to have some erythema on the distal portion that extends into the toes with large vesicles on the plantar aspect of the second and third toes.  Bullous on the back of the left heel has been drained remains drained. There is another small either skin tear were denuded bullae along the left calf with some hemorrhage and copious serous drainage that I did not observe today.  No other bullae noted on arms, chest, abdomen, or legs today.      The results of significant diagnostics from this hospitalization (including imaging, microbiology, ancillary and laboratory) are listed below for reference.     Microbiology: Recent Results (from the past 240 hour(s))  Culture, blood (Routine x 2)     Status: Abnormal   Collection Time: 02/25/17  9:50 PM  Result Value Ref Range Status   Specimen Description BLOOD RIGHT ANTECUBITAL  Final   Special Requests   Final    BOTTLES DRAWN AEROBIC AND ANAEROBIC Blood Culture adequate volume   Culture  Setup Time   Final    GRAM POSITIVE COCCI IN CLUSTERS AEROBIC BOTTLE ONLY CRITICAL VALUE NOTED.  VALUE IS CONSISTENT WITH PREVIOUSLY REPORTED AND CALLED VALUE.    Culture (A)  Final    STAPHYLOCOCCUS SPECIES (COAGULASE NEGATIVE) SUSCEPTIBILITIES PERFORMED ON PREVIOUS CULTURE WITHIN THE LAST 5 DAYS.    Report Status 03/01/2017 FINAL  Final  Culture, blood (Routine x 2)     Status: Abnormal   Collection Time: 02/25/17  9:55 PM  Result Value Ref Range Status   Specimen Description BLOOD LEFT ANTECUBITAL  Final   Special Requests   Final    BOTTLES DRAWN AEROBIC AND ANAEROBIC Blood Culture adequate volume   Culture  Setup Time   Final    GRAM  POSITIVE COCCI  IN BOTH AEROBIC AND ANAEROBIC BOTTLES CRITICAL RESULT CALLED TO, READ BACK BY AND VERIFIED WITH: T RUDISILL, PHARMD 02/27/17 0344 L CHAMPION    Culture STAPHYLOCOCCUS SPECIES (COAGULASE NEGATIVE) (A)  Final   Report Status 03/01/2017 FINAL  Final   Organism ID, Bacteria STAPHYLOCOCCUS SPECIES (COAGULASE NEGATIVE)  Final      Susceptibility   Staphylococcus species (coagulase negative) - MIC*    CIPROFLOXACIN 4 RESISTANT Resistant     ERYTHROMYCIN <=0.25 SENSITIVE Sensitive     GENTAMICIN <=0.5 SENSITIVE Sensitive     OXACILLIN >=4 RESISTANT Resistant     TETRACYCLINE <=1 SENSITIVE Sensitive     VANCOMYCIN <=0.5 SENSITIVE Sensitive     TRIMETH/SULFA 80 RESISTANT Resistant     CLINDAMYCIN <=0.25 SENSITIVE Sensitive     RIFAMPIN <=0.5 SENSITIVE Sensitive     Inducible Clindamycin NEGATIVE Sensitive     * STAPHYLOCOCCUS SPECIES (COAGULASE NEGATIVE)  Blood Culture ID Panel (Reflexed)     Status: Abnormal   Collection Time: 02/25/17  9:55 PM  Result Value Ref Range Status   Enterococcus species NOT DETECTED NOT DETECTED Final   Listeria monocytogenes NOT DETECTED NOT DETECTED Final   Staphylococcus species DETECTED (A) NOT DETECTED Final    Comment: Methicillin (oxacillin) resistant coagulase negative staphylococcus. Possible blood culture contaminant (unless isolated from more than one blood culture draw or clinical case suggests pathogenicity). No antibiotic treatment is indicated for blood  culture contaminants. CRITICAL RESULT CALLED TO, READ BACK BY AND VERIFIED WITH: T RUDISILL, PHARMD 02/27/17 0344 L CHAMPION    Staphylococcus aureus NOT DETECTED NOT DETECTED Final   Methicillin resistance DETECTED (A) NOT DETECTED Final    Comment: CRITICAL RESULT CALLED TO, READ BACK BY AND VERIFIED WITH: T RUDISILL, PHARMD 02/27/17 0344 L CHAMPION    Streptococcus species NOT DETECTED NOT DETECTED Final   Streptococcus agalactiae NOT DETECTED NOT DETECTED Final   Streptococcus  pneumoniae NOT DETECTED NOT DETECTED Final   Streptococcus pyogenes NOT DETECTED NOT DETECTED Final   Acinetobacter baumannii NOT DETECTED NOT DETECTED Final   Enterobacteriaceae species NOT DETECTED NOT DETECTED Final   Enterobacter cloacae complex NOT DETECTED NOT DETECTED Final   Escherichia coli NOT DETECTED NOT DETECTED Final   Klebsiella oxytoca NOT DETECTED NOT DETECTED Final   Klebsiella pneumoniae NOT DETECTED NOT DETECTED Final   Proteus species NOT DETECTED NOT DETECTED Final   Serratia marcescens NOT DETECTED NOT DETECTED Final   Haemophilus influenzae NOT DETECTED NOT DETECTED Final   Neisseria meningitidis NOT DETECTED NOT DETECTED Final   Pseudomonas aeruginosa NOT DETECTED NOT DETECTED Final   Candida albicans NOT DETECTED NOT DETECTED Final   Candida glabrata NOT DETECTED NOT DETECTED Final   Candida krusei NOT DETECTED NOT DETECTED Final   Candida parapsilosis NOT DETECTED NOT DETECTED Final   Candida tropicalis NOT DETECTED NOT DETECTED Final  Urine culture     Status: None   Collection Time: 02/25/17 11:27 PM  Result Value Ref Range Status   Specimen Description URINE, CATHETERIZED  Final   Special Requests NONE  Final   Culture NO GROWTH  Final   Report Status 02/27/2017 FINAL  Final  Culture, blood (single) w Reflex to ID Panel     Status: None (Preliminary result)   Collection Time: 02/27/17  8:06 AM  Result Value Ref Range Status   Specimen Description BLOOD RIGHT ANTECUBITAL  Final   Special Requests   Final    BOTTLES DRAWN AEROBIC ONLY Blood Culture adequate volume   Culture NO  GROWTH 2 DAYS  Final   Report Status PENDING  Incomplete     Labs: BNP (last 3 results) No results for input(s): BNP in the last 8760 hours. Basic Metabolic Panel:  Recent Labs Lab 02/25/17 2155 02/26/17 0228 02/27/17 0614 02/28/17 0402 03/01/17 0418  NA 138 139 142 143 142  K 4.2 4.0 4.0 3.8 3.9  CL 106 109 113* 114* 112*  CO2 25 23 24 22  19*  GLUCOSE 151* 176*  115* 118* 101*  BUN 29* 27* 26* 23* 20  CREATININE 1.76* 1.65* 1.28* 1.05* 1.02*  CALCIUM 9.8 9.3 9.3 9.3 9.4   Liver Function Tests:  Recent Labs Lab 02/25/17 2155 02/26/17 0228  AST 22 23  ALT 21 20  ALKPHOS 116 113  BILITOT 0.6 0.6  PROT 6.0* 5.9*  ALBUMIN 2.7* 2.5*   No results for input(s): LIPASE, AMYLASE in the last 168 hours. No results for input(s): AMMONIA in the last 168 hours. CBC:  Recent Labs Lab 02/25/17 2155 02/26/17 0228 02/27/17 0614 02/28/17 0402 03/01/17 0418  WBC 21.2* 20.8* 15.9* 10.1 7.6  NEUTROABS 18.7* 19.3*  --   --   --   HGB 13.0 12.5 11.7* 11.6* 11.7*  HCT 39.3 37.5 37.3 35.9* 35.6*  MCV 100.0 101.1* 100.8* 100.3* 101.1*  PLT 178 155 178 206 220   Cardiac Enzymes: No results for input(s): CKTOTAL, CKMB, CKMBINDEX, TROPONINI in the last 168 hours. BNP: Invalid input(s): POCBNP CBG:  Recent Labs Lab 02/28/17 0757 02/28/17 1150 02/28/17 1649 03/01/17 0626 03/01/17 1127  GLUCAP 118* 146* 136* 109* 136*   D-Dimer No results for input(s): DDIMER in the last 72 hours. Hgb A1c No results for input(s): HGBA1C in the last 72 hours. Lipid Profile No results for input(s): CHOL, HDL, LDLCALC, TRIG, CHOLHDL, LDLDIRECT in the last 72 hours. Thyroid function studies No results for input(s): TSH, T4TOTAL, T3FREE, THYROIDAB in the last 72 hours.  Invalid input(s): FREET3 Anemia work up No results for input(s): VITAMINB12, FOLATE, FERRITIN, TIBC, IRON, RETICCTPCT in the last 72 hours. Urinalysis    Component Value Date/Time   COLORURINE YELLOW 02/25/2017 2327   APPEARANCEUR CLEAR 02/25/2017 2327   LABSPEC 1.011 02/25/2017 2327   PHURINE 5.0 02/25/2017 2327   GLUCOSEU NEGATIVE 02/25/2017 2327   GLUCOSEU NEGATIVE 12/18/2010 1703   HGBUR NEGATIVE 02/25/2017 2327   BILIRUBINUR NEGATIVE 02/25/2017 2327   KETONESUR NEGATIVE 02/25/2017 2327   PROTEINUR NEGATIVE 02/25/2017 2327   UROBILINOGEN 0.2 12/18/2010 1703   NITRITE NEGATIVE  02/25/2017 2327   LEUKOCYTESUR NEGATIVE 02/25/2017 2327   Sepsis Labs Invalid input(s): PROCALCITONIN,  WBC,  LACTICIDVEN   Time coordinating discharge: Over 30 minutes  SIGNED:   Renae Fickle, MD  [facility] 03/01/2017, 1:55 PM Pager   If 7PM-7AM, please contact night-coverage [email or web address] Password [email or web address]

## 2017-03-01 NOTE — Progress Notes (Signed)
[facility]   Reason for visit: Follow up on Staph bacteremia  Interval History: repeat blood cultures ngtd from 7/25; no fever, normal wbc.  No complaint of hip pain.    Physical Exam: Constitutional:  Vitals:   03/01/17 0530 03/01/17 0945  BP: 140/87 (!) 152/66  Pulse: 61 67  Resp:    Temp: 98.7 F (37.1 C) 98.7 F (37.1 C)   patient appears in NAD Respiratory: Normal respiratory effort; CTA B Cardiovascular: RRR GI: soft, nt, nd  Review of Systems: Constitutional: negative for fevers and chills Gastrointestinal: negative for nausea and diarrhea Integument/breast: negative for rash  Lab Results  Component Value Date   WBC 7.6 03/01/2017   HGB 11.7 (L) 03/01/2017   HCT 35.6 (L) 03/01/2017   MCV 101.1 (H) 03/01/2017   PLT 220 03/01/2017    Lab Results  Component Value Date   CREATININE 1.02 (H) 03/01/2017   BUN 20 03/01/2017   NA 142 03/01/2017   K 3.9 03/01/2017   CL 112 (H) 03/01/2017   CO2 19 (L) 03/01/2017    Lab Results  Component Value Date   ALT 20 02/26/2017   AST 23 02/26/2017   ALKPHOS 113 02/26/2017     Microbiology: Recent Results (from the past 240 hour(s))  Culture, blood (Routine x 2)     Status: Abnormal   Collection Time: 02/25/17  9:50 PM  Result Value Ref Range Status   Specimen Description BLOOD RIGHT ANTECUBITAL  Final   Special Requests   Final    BOTTLES DRAWN AEROBIC AND ANAEROBIC Blood Culture adequate volume   Culture  Setup Time   Final    GRAM POSITIVE COCCI IN CLUSTERS AEROBIC BOTTLE ONLY CRITICAL VALUE NOTED.  VALUE IS CONSISTENT WITH PREVIOUSLY REPORTED AND CALLED VALUE.    Culture (A)  Final    STAPHYLOCOCCUS SPECIES (COAGULASE NEGATIVE) SUSCEPTIBILITIES PERFORMED ON PREVIOUS CULTURE WITHIN THE LAST 5 DAYS.    Report Status 03/01/2017 FINAL  Final  Culture, blood (Routine x 2)     Status: Abnormal   Collection Time: 02/25/17  9:55 PM  Result Value Ref Range Status   Specimen  Description BLOOD LEFT ANTECUBITAL  Final   Special Requests   Final    BOTTLES DRAWN AEROBIC AND ANAEROBIC Blood Culture adequate volume   Culture  Setup Time   Final    GRAM POSITIVE COCCI IN BOTH AEROBIC AND ANAEROBIC BOTTLES CRITICAL RESULT CALLED TO, READ BACK BY AND VERIFIED WITH: T RUDISILL, PHARMD 02/27/17 0344 L CHAMPION    Culture STAPHYLOCOCCUS SPECIES (COAGULASE NEGATIVE) (A)  Final   Report Status 03/01/2017 FINAL  Final   Organism ID, Bacteria STAPHYLOCOCCUS SPECIES (COAGULASE NEGATIVE)  Final      Susceptibility   Staphylococcus species (coagulase negative) - MIC*    CIPROFLOXACIN 4 RESISTANT Resistant     ERYTHROMYCIN <=0.25 SENSITIVE Sensitive     GENTAMICIN <=0.5 SENSITIVE Sensitive     OXACILLIN >=4 RESISTANT Resistant     TETRACYCLINE <=1 SENSITIVE Sensitive     VANCOMYCIN <=0.5 SENSITIVE Sensitive     TRIMETH/SULFA 80 RESISTANT Resistant     CLINDAMYCIN <=0.25 SENSITIVE Sensitive     RIFAMPIN <=0.5 SENSITIVE Sensitive     Inducible Clindamycin NEGATIVE Sensitive     * STAPHYLOCOCCUS SPECIES (COAGULASE NEGATIVE)  Blood Culture ID Panel (Reflexed)     Status: Abnormal   Collection Time: 02/25/17  9:55 PM  Result Value Ref Range Status   Enterococcus species NOT  DETECTED NOT DETECTED Final   Listeria monocytogenes NOT DETECTED NOT DETECTED Final   Staphylococcus species DETECTED (A) NOT DETECTED Final    Comment: Methicillin (oxacillin) resistant coagulase negative staphylococcus. Possible blood culture contaminant (unless isolated from more than one blood culture draw or clinical case suggests pathogenicity). No antibiotic treatment is indicated for blood  culture contaminants. CRITICAL RESULT CALLED TO, READ BACK BY AND VERIFIED WITH: T RUDISILL, PHARMD 02/27/17 0344 L CHAMPION    Staphylococcus aureus NOT DETECTED NOT DETECTED Final   Methicillin resistance DETECTED (A) NOT DETECTED Final    Comment: CRITICAL RESULT CALLED TO, READ BACK BY AND VERIFIED WITH: T  RUDISILL, PHARMD 02/27/17 0344 L CHAMPION    Streptococcus species NOT DETECTED NOT DETECTED Final   Streptococcus agalactiae NOT DETECTED NOT DETECTED Final   Streptococcus pneumoniae NOT DETECTED NOT DETECTED Final   Streptococcus pyogenes NOT DETECTED NOT DETECTED Final   Acinetobacter baumannii NOT DETECTED NOT DETECTED Final   Enterobacteriaceae species NOT DETECTED NOT DETECTED Final   Enterobacter cloacae complex NOT DETECTED NOT DETECTED Final   Escherichia coli NOT DETECTED NOT DETECTED Final   Klebsiella oxytoca NOT DETECTED NOT DETECTED Final   Klebsiella pneumoniae NOT DETECTED NOT DETECTED Final   Proteus species NOT DETECTED NOT DETECTED Final   Serratia marcescens NOT DETECTED NOT DETECTED Final   Haemophilus influenzae NOT DETECTED NOT DETECTED Final   Neisseria meningitidis NOT DETECTED NOT DETECTED Final   Pseudomonas aeruginosa NOT DETECTED NOT DETECTED Final   Candida albicans NOT DETECTED NOT DETECTED Final   Candida glabrata NOT DETECTED NOT DETECTED Final   Candida krusei NOT DETECTED NOT DETECTED Final   Candida parapsilosis NOT DETECTED NOT DETECTED Final   Candida tropicalis NOT DETECTED NOT DETECTED Final  Urine culture     Status: None   Collection Time: 02/25/17 11:27 PM  Result Value Ref Range Status   Specimen Description URINE, CATHETERIZED  Final   Special Requests NONE  Final   Culture NO GROWTH  Final   Report Status 02/27/2017 FINAL  Final  Culture, blood (single) w Reflex to ID Panel     Status: None (Preliminary result)   Collection Time: 02/27/17  8:06 AM  Result Value Ref Range Status   Specimen Description BLOOD RIGHT ANTECUBITAL  Final   Special Requests   Final    BOTTLES DRAWN AEROBIC ONLY Blood Culture adequate volume   Culture NO GROWTH 1 DAY  Final   Report Status PENDING  Incomplete    Impression/Plan:  1. Staph species bacteremia - not otherwise identified.  Repeat negative.  I most suspect this is cellulitis related.  Her hips  and knees do not have any identifiable effusion I would treat with linezolid for 5 days at discharge If she develops a fever during or in the next 1-2 weeks and no source identified, I would repeat the blood culture   2. Rash - to get dermatology evaluation after discharge.    I will sign off, call with any new issues or concerns thanks

## 2017-03-01 NOTE — Clinical Social Work Placement (Signed)
   CLINICAL SOCIAL WORK PLACEMENT  NOTE  Date:  03/01/2017  Patient Details  Name: Clydene PughBonnie J Mazon MRN: [id number] Date of Birth: 19-Jul-1932  Clinical Social Work is seeking post-discharge placement for this patient at the Skilled  Nursing Facility level of care (*CSW will initial, date and re-position this form in  chart as items are completed):  Yes   Patient/family provided with [place] Clinical Social Work Department's list of facilities offering this level of care within the geographic area requested by the patient (or if unable, by the patient's family).  Yes   Patient/family informed of their freedom to choose among providers that offer the needed level of care, that participate in Medicare, Medicaid or managed care program needed by the patient, have an available bed and are willing to accept the patient.  Yes   Patient/family informed of 's ownership interest in [facility] and [facility], as well as of the fact that they are under no obligation to receive care at these facilities.  PASRR submitted to EDS on       PASRR number received on       Existing PASRR number confirmed on 02/28/17     FL2 transmitted to all facilities in geographic area requested by pt/family on       FL2 transmitted to all facilities within larger geographic area on 03/01/17     Patient informed that his/her managed care company has contracts with or will negotiate with certain facilities, including the following:        Yes   Patient/family informed of bed offers received.  Patient chooses bed at [facility] [place]     Physician recommends and patient chooses bed at      Patient to be transferred to [facility] [place] on 03/01/17.  Patient to be transferred to facility by       Patient family notified on 03/01/17 of transfer.  Name of family member notified:  daughter     PHYSICIAN Please prepare priority discharge summary, including medications, Please  prepare prescriptions, Please sign DNR, Please sign FL2     Additional Comment:    _______________________________________________ Tresa MoorePatricia V Handy Mcloud, LCSW 03/01/2017, 2:12 PM

## 2017-03-01 NOTE — Progress Notes (Signed)
ANTICOAGULATION CONSULT NOTE -Follow-up Consult  Pharmacy Consult for Eliquis Indication: Hx VTE  Allergies  Allergen Reactions  . Morphine     Hallucinations   . Ofloxacin     Unable to remember    Patient Measurements: Height:  (UTA at this time) Weight: 228 lb 6.4 oz (103.6 kg)  Assessment: 83 y/o F here with cellulitis/altered mental status, on Coumadin 2mg  daily exc for 4mg  on MWF PTA for acute RLE DVT diagnosed as outpatient 2 weeks prior.   INR is therapeutic at 2.88 today and per discussion with Dr. Malachi BondsShort there is concern that bullae on feet may be associated with Coumadin therapy as timing of presentation fits and starting to discolor. Dr. Malachi BondsShort has consulted with dermatology and is stopping Coumadin to be safe. Placed Pharmacy consult to change patient to Eliquis when INR is < or = to 2.   SCr is currently trending down. Discussed renal function with Dr. Malachi BondsShort and emphasized need to follow renal function closely on Eliquis therapy as outpatient.   Goal of Therapy:  INR 2-3 Monitor platelets by anticoagulation protocol: Yes   Plan:  No Eliquis tonight due to INR of 2.88. If discharge to SNF- recheck INR daily if possible or on Sunday latest.  If unable to obtain INR, would recommend starting Eliquis on Sunday at dose of 5mg  po BID (since has already had 2 weeks of Coumadin therapy and was therapeutic).  Monitor daily INR for ability to start therapy, CBC, s/s of bleed Monitor SCr.  Link SnufferJessica Khrystal Jeanmarie, PharmD, BCPS Clinical Pharmacist Clinical Phone 03/01/2017 until 3:30PM - [phone] After hours, please call #[phone] 03/01/2017 1:52 PM

## 2017-03-01 NOTE — Clinical Social Work Note (Signed)
Clinical Social Work Assessment  Patient Details  Name: Andrea Mccoy MRN: [id number] Date of Birth: 1931/12/06  Date of referral:  02/28/17               Reason for consult:  Facility Placement                Permission sought to share information with:  Facility Industrial/product designerContact Representative Permission granted to share information::  Yes, Verbal Permission Granted  Name::     daughter  Agency::  [facility]  Relationship::     Contact Information:     Housing/Transportation Living arrangements for the past 2 months:  Assisted Living Facility Source of Information:  Adult Children, Other (Comment Required) Engineer, building services(Facility) Patient Interpreter Needed:  None Criminal Activity/Legal Involvement Pertinent to Current Situation/Hospitalization:  No - Comment as needed Significant Relationships:  Adult Children Lives with:  Facility Resident Do you feel safe going back to the place where you live?  Yes Need for family participation in patient care:  Yes (Comment)  Care giving concerns:  Patient resides at [facility]. Pt will return to nursing facility when medically stable.  Social Worker assessment / plan:  CSW will transition patient back to nursing facility when medically stable. CSW confirmed with family and facility.  FL2 completed. Passr confirmed. Offer sent.  Employment status:  Retired Health and safety inspectornsurance information:  Medicare PT Recommendations:  Skilled Nursing Facility Information / Referral to community resources:  Skilled Nursing Facility  Patient/Family's Response to care:  No issues with care. Family appreciative of CSW assistance.  Patient/Family's Understanding of and Emotional Response to Diagnosis, Current Treatment, and Prognosis:  Family has good understanding of diagnosis, current treatment and prognosis. No issues or concerns identified at this time.  Emotional Assessment Appearance:  Appears stated age Attitude/Demeanor/Rapport:   (Cooperative) Affect (typically  observed):  Accepting, Appropriate Orientation:  Oriented to Self Alcohol / Substance use:  Not Applicable Psych involvement (Current and /or in the community):  No (Comment)  Discharge Needs  Concerns to be addressed:  Care Coordination Readmission within the last 30 days:  No Current discharge risk:  None Barriers to Discharge:  No Barriers Identified   Andrea Mooreatricia V Judah Carchi, LCSW 03/01/2017, 2:27 PM

## 2017-03-01 NOTE — Discharge Instructions (Addendum)
Information on my medicine - ELIQUIS (apixaban)  This medication education was reviewed with me or my healthcare representative as part of my discharge preparation.  The pharmacist that spoke with me during my hospital stay was:  Fayne NorrieMillen, Alora Gorey Brown, [facility]PH  Why was Eliquis prescribed for you? Eliquis was prescribed to treat blood clots that may have been found in the veins of your legs (deep vein thrombosis) or in your lungs (pulmonary embolism) and to reduce the risk of them occurring again.  What do You need to know about Eliquis ? The dose is ONE 5 mg tablet taken TWICE daily.  Eliquis may be taken with or without food. * This will not start until INR falls to <=2 or Sunday at latest as determine by SNF.   Try to take the dose about the same time in the morning and in the evening. If you have difficulty swallowing the tablet whole please discuss with your pharmacist how to take the medication safely.  Take Eliquis exactly as prescribed and DO NOT stop taking Eliquis without talking to the doctor who prescribed the medication.  Stopping may increase your risk of developing a new blood clot.  Refill your prescription before you run out.  After discharge, you should have regular check-up appointments with your healthcare provider that is prescribing your Eliquis.    What do you do if you miss a dose? If a dose of ELIQUIS is not taken at the scheduled time, take it as soon as possible on the same day and twice-daily administration should be resumed. The dose should not be doubled to make up for a missed dose.  Important Safety Information A possible side effect of Eliquis is bleeding. You should call your healthcare provider right away if you experience any of the following: ? Bleeding from an injury or your nose that does not stop. ? Unusual colored urine (red or dark brown) or unusual colored stools (red or black). ? Unusual bruising for unknown reasons. ? A serious fall or if you  hit your head (even if there is no bleeding).  Some medicines may interact with Eliquis and might increase your risk of bleeding or clotting while on Eliquis. To help avoid this, consult your healthcare provider or pharmacist prior to using any new prescription or non-prescription medications, including herbals, vitamins, non-steroidal anti-inflammatory drugs (NSAIDs) and supplements.  This website has more information on Eliquis (apixaban): http://www.eliquis.com/eliquis/home

## 2017-03-01 NOTE — Progress Notes (Signed)
PT Cancellation Note  Patient Details Name: Andrea Mccoy MRN: [id number] DOB: September 22, 1931   Cancelled Treatment:    Reason Eval/Treat Not Completed: Other (comment) (Pt imminently discharging.) Pt awaiting transport to SNF. Will defer further therapy services to SNF.  [name], VirginiaPTA Office #[phone]  [name] 03/01/2017, 3:38 PM

## 2017-03-01 NOTE — Care Management Important Message (Signed)
Important Message  Patient Details  Name: Andrea Mccoy MRN: [id number] Date of Birth: Oct 11, 1931   Medicare Important Message Given:  Yes    Elliot CousinShavis, Carmell Elgin Ellen, RN 03/01/2017, 3:29 PM

## 2017-03-01 NOTE — Progress Notes (Signed)
Removed IV, called SNF and gave report to nurse Marylu LundJanet. Provided discharge education to patient and family member, all questions and concerns addressed, patient not in distress. Transported to SNF via [facility].

## 2017-03-01 NOTE — Social Work (Signed)
Clinical Social Worker facilitated patient discharge including contacting patient family and facility to confirm patient discharge plans.  Clinical information faxed to facility and family agreeable with plan.    CSW arranged ambulance transport via [facility] to [facility].    RN to call [phone] report prior to discharge. Pt going to Room 603.  Clinical Social Worker will sign off for now as social work intervention is no longer needed. Please consult us again if new need arises.  Keene BreathPatricia Katalia Choma, LCSW Clinical Social Worker [phone]

## 2017-03-02 DIAGNOSIS — F0280 Dementia in other diseases classified elsewhere without behavioral disturbance: Secondary | ICD-10-CM | POA: Diagnosis not present

## 2017-03-02 DIAGNOSIS — M6281 Muscle weakness (generalized): Secondary | ICD-10-CM | POA: Diagnosis not present

## 2017-03-02 DIAGNOSIS — M24542 Contracture, left hand: Secondary | ICD-10-CM | POA: Diagnosis not present

## 2017-03-02 DIAGNOSIS — I872 Venous insufficiency (chronic) (peripheral): Secondary | ICD-10-CM | POA: Diagnosis not present

## 2017-03-03 ENCOUNTER — Encounter (HOSPITAL_COMMUNITY): Payer: Self-pay | Admitting: Emergency Medicine

## 2017-03-03 ENCOUNTER — Emergency Department (HOSPITAL_COMMUNITY)
Admission: EM | Admit: 2017-03-03 | Discharge: 2017-03-03 | Disposition: A | Discharge status: Home / Self Care | Payer: Medicare Other | Attending: Emergency Medicine | Admitting: Emergency Medicine

## 2017-03-03 ENCOUNTER — Emergency Department (HOSPITAL_COMMUNITY): Payer: Medicare Other

## 2017-03-03 DIAGNOSIS — Z96641 Presence of right artificial hip joint: Secondary | ICD-10-CM | POA: Diagnosis not present

## 2017-03-03 DIAGNOSIS — R410 Disorientation, unspecified: Secondary | ICD-10-CM | POA: Diagnosis not present

## 2017-03-03 DIAGNOSIS — I10 Essential (primary) hypertension: Secondary | ICD-10-CM | POA: Diagnosis not present

## 2017-03-03 DIAGNOSIS — S90822A Blister (nonthermal), left foot, initial encounter: Secondary | ICD-10-CM | POA: Diagnosis not present

## 2017-03-03 DIAGNOSIS — R4182 Altered mental status, unspecified: Secondary | ICD-10-CM | POA: Diagnosis not present

## 2017-03-03 DIAGNOSIS — Z8673 Personal history of transient ischemic attack (TIA), and cerebral infarction without residual deficits: Secondary | ICD-10-CM | POA: Insufficient documentation

## 2017-03-03 DIAGNOSIS — J80 Acute respiratory distress syndrome: Secondary | ICD-10-CM | POA: Diagnosis not present

## 2017-03-03 DIAGNOSIS — I2510 Atherosclerotic heart disease of native coronary artery without angina pectoris: Secondary | ICD-10-CM | POA: Insufficient documentation

## 2017-03-03 DIAGNOSIS — R0902 Hypoxemia: Secondary | ICD-10-CM

## 2017-03-03 DIAGNOSIS — I82409 Acute embolism and thrombosis of unspecified deep veins of unspecified lower extremity: Secondary | ICD-10-CM | POA: Diagnosis not present

## 2017-03-03 DIAGNOSIS — R509 Fever, unspecified: Secondary | ICD-10-CM | POA: Diagnosis not present

## 2017-03-03 DIAGNOSIS — E039 Hypothyroidism, unspecified: Secondary | ICD-10-CM | POA: Insufficient documentation

## 2017-03-03 DIAGNOSIS — E440 Moderate protein-calorie malnutrition: Secondary | ICD-10-CM | POA: Diagnosis not present

## 2017-03-03 DIAGNOSIS — R069 Unspecified abnormalities of breathing: Secondary | ICD-10-CM | POA: Diagnosis not present

## 2017-03-03 DIAGNOSIS — Z96651 Presence of right artificial knee joint: Secondary | ICD-10-CM | POA: Insufficient documentation

## 2017-03-03 DIAGNOSIS — R5383 Other fatigue: Secondary | ICD-10-CM | POA: Diagnosis not present

## 2017-03-03 DIAGNOSIS — Z79899 Other long term (current) drug therapy: Secondary | ICD-10-CM | POA: Diagnosis not present

## 2017-03-03 LAB — BASIC METABOLIC PANEL
ANION GAP: 10 (ref 5–15)
BUN: 17 mg/dL (ref 6–20)
CO2: 25 mmol/L (ref 22–32)
Calcium: 9.7 mg/dL (ref 8.9–10.3)
Chloride: 104 mmol/L (ref 101–111)
Creatinine, Ser: 1.16 mg/dL — ABNORMAL HIGH (ref 0.44–1.00)
GFR calc Af Amer: 49 mL/min — ABNORMAL LOW (ref >60)
GFR calc non Af Amer: 43 mL/min — ABNORMAL LOW (ref >60)
GLUCOSE: 131 mg/dL — AB (ref 65–99)
POTASSIUM: 3.7 mmol/L (ref 3.5–5.1)
Sodium: 139 mmol/L (ref 135–145)

## 2017-03-03 LAB — CBC WITH DIFFERENTIAL/PLATELET
BASOS ABS: 0 10*3/uL (ref 0.0–0.1)
Basophils Relative: 0 %
EOS PCT: 4 %
Eosinophils Absolute: 0.3 10*3/uL (ref 0.0–0.7)
HEMATOCRIT: 38.3 % (ref 36.0–46.0)
Hemoglobin: 12.4 g/dL (ref 12.0–15.0)
LYMPHS ABS: 1.5 10*3/uL (ref 0.7–4.0)
LYMPHS PCT: 20 %
MCH: 32 pg (ref 26.0–34.0)
MCHC: 32.4 g/dL (ref 30.0–36.0)
MCV: 98.7 fL (ref 78.0–100.0)
MONO ABS: 0.5 10*3/uL (ref 0.1–1.0)
Monocytes Relative: 7 %
NEUTROS ABS: 5.4 10*3/uL (ref 1.7–7.7)
Neutrophils Relative %: 69 %
PLATELETS: 297 10*3/uL (ref 150–400)
RBC: 3.88 MIL/uL (ref 3.87–5.11)
RDW: 14.1 % (ref 11.5–15.5)
WBC: 7.8 10*3/uL (ref 4.0–10.5)

## 2017-03-03 LAB — I-STAT TROPONIN, ED: Troponin i, poc: 0.01 ng/mL (ref 0.00–0.08)

## 2017-03-03 LAB — BRAIN NATRIURETIC PEPTIDE: B Natriuretic Peptide: 70.5 pg/mL (ref 0.0–100.0)

## 2017-03-03 NOTE — ED Provider Notes (Signed)
[facility]-EMERGENCY DEPT Provider Note   CSN: [id number] Arrival date & time: 03/03/17  0235 By signing my name below, I, Levon Hedger, attest that this documentation has been prepared under the direction and in the presence of Vida Nicol, Canary Brim, MD . Electronically Signed: Levon Hedger, Scribe. 03/03/2017. 3:10 AM.   History   Chief Complaint Chief Complaint  Patient presents with  . Low Oxygen   LEVEL 5 CAVEAT: HPI AND ROS LIMITED DUE TO DEMENTIA    HPI Andrea Mccoy is a 83 y.o. female, brought in by ambulance from [facility], who presents to the Emergency Department for evaluation after pt had low O2 sat reading tonight. She was given 1 duoneb en route to the ED which significantly improve symptoms. Pt's O2 sats noted to be 100 on RA while in the ED. Pt has no other acute complaints or associated symptoms at this time.    The history is provided by medical records and the EMS personnel. The history is limited by the condition of the patient. No language interpreter was used.   Past Medical History:  Diagnosis Date  . Allergic rhinitis   . Alzheimer's dementia   . Anemia   . Anxiety   . Arteriosclerotic heart disease   . Asthmatic bronchitis   . Calculus of gallbladder without cholecystitis without obstruction   . Cholelithiasis   . Chronic UTI   . Constipation   . Diverticulosis of colon   . DJD (degenerative joint disease)   . Gait abnormality   . GERD (gastroesophageal reflux disease)   . History of falling   . Hypercholesteremia   . Hyperlipidemia   . Hypertension   . Hypokalemia   . Hypothyroid   . Intracranial aneurysm   . Memory loss   . Multinodular goiter   . Nontoxic multinodular goiter   . TIA (transient ischemic attack)   . Venous insufficiency (chronic) (peripheral)   . Vitamin D deficiency     Patient Active Problem List   Diagnosis Date Noted  . Bacteremia due to Gram-positive bacteria 03/01/2017  . Pressure injury of skin 02/27/2017  .  Cellulitis of left lower extremity 02/26/2017  . Sepsis (HCC) 02/26/2017  . Cellulitis 02/25/2017  . CAD (coronary artery disease) 08/28/2011  . Dyslipidemia 08/28/2011  . Syncope 01/04/2011  . NONTOXIC MULTINODULAR GOITER 05/13/2010  . INTRACRANIAL ANEURYSM 05/13/2010  . VENOUS INSUFFICIENCY, CHRONIC 01/09/2010  . NUMBNESS 11/18/2008  . ALLERGIC RHINITIS 15-May-202010  . Hypothyroidism 05/25/2008  . ASTHMATIC BRONCHITIS, ACUTE 05/25/2008  . DIVERTICULOSIS OF COLON 09/23/2007  . MEMORY LOSS 09/23/2007  . TIA 09/03/2007  . HYPERCHOLESTEROLEMIA 07/16/2007  . ANEMIA 07/16/2007  . ANXIETY 07/16/2007  . Essential hypertension 07/16/2007  . ARTERIOSCLEROTIC HEART DISEASE 07/16/2007  . GERD 07/16/2007  . URINARY TRACT INFECTION, CHRONIC 07/16/2007  . DEGENERATIVE JOINT DISEASE 07/16/2007    Past Surgical History:  Procedure Laterality Date  . ABDOMINAL HYSTERECTOMY    . bilateral THR's    . LAPAROSCOPIC CHOLECYSTECTOMY  01/2007   Dr. Odie Sera  . left femur fracture  04/2002   Dr. Ranell Patrick  . left total hip    . LUMBAR FUSION    . right total hip    . right total knee  11/1999   Dr. Leslee Home    OB History    No data available      Home Medications    Prior to Admission medications   Medication Sig Start Date End Date Taking? Authorizing Provider  acetaminophen (TYLENOL) 650 MG  CR tablet Take 650 mg by mouth every 4 (four) hours as needed for pain.   Yes [provider]  albuterol (PROVENTIL) (2.5 MG/3ML) 0.083% nebulizer solution Take 2.5 mg by nebulization 3 (three) times daily as needed for wheezing or shortness of breath.   Yes [provider]  Amino Acids-Protein Hydrolys (FEEDING SUPPLEMENT, PRO-STAT SUGAR FREE 64,) LIQD Take 30 mLs by mouth daily.   Yes [provider]  aspirin 81 MG chewable tablet Chew 81 mg by mouth daily.   Yes [provider]  Cholecalciferol (VITAMIN D) 2000 UNITS CAPS Take 1 capsule by mouth daily.   Yes  [provider]  fish oil-omega-3 fatty acids 1000 MG capsule Take 1 g by mouth daily.     Yes [provider]  furosemide (LASIX) 40 MG tablet Take 1 tablet (40 mg total) by mouth 2 (two) times daily. 03/01/17  Yes Short, Thea SilversmithMackenzie, MD  hydroxypropyl methylcellulose / hypromellose (ISOPTO TEARS / GONIOVISC) 2.5 % ophthalmic solution Place 1 drop into both eyes 4 (four) times daily.   Yes [provider]  insulin aspart (NOVOLOG FLEXPEN) 100 UNIT/ML FlexPen Inject 0-12 Units into the skin 4 (four) times daily as needed for high blood sugar. Less than 150=0 units 151-200=4 units 201-250=6 units 251-300=8 units 301-350=10 units Greater than 351=12 units   Yes [provider]  isosorbide mononitrate (IMDUR) 30 MG 24 hr tablet Take 1/2 tablet by mouth once daily Patient taking differently: Take 15 mg by mouth daily.  11/14/12  Yes Michele McalpineNadel, Scott M, MD  lactulose (CHRONULAC) 10 GM/15ML solution Take 45 g by mouth daily.    Yes [provider]  levothyroxine (SYNTHROID, LEVOTHROID) 25 MCG tablet Take 1 tablet (25 mcg total) by mouth daily before breakfast. 03/02/17  Yes Short, Thea SilversmithMackenzie, MD  lidocaine (XYLOCAINE) 2 % solution 20 mLs 2 (two) times daily as needed (for Hemorrhoids). Apply to rectal area    Yes [provider]  linezolid (ZYVOX) 600 MG tablet Take 1 tablet (600 mg total) by mouth 2 (two) times daily. 03/01/17  Yes Short, Thea SilversmithMackenzie, MD  Memantine HCl-Donepezil HCl (NAMZARIC) 28-10 MG CP24 Take 1 capsule by mouth daily.   Yes [provider]  metoCLOPramide (REGLAN) 5 MG tablet Take 5 mg by mouth 2 (two) times daily.   Yes [provider]  metoprolol succinate (TOPROL-XL) 25 MG 24 hr tablet Take 100 mg by mouth daily.    Yes [provider]  Multiple Vitamins-Minerals (CENTRUM SILVER PO) Take 1 tablet by mouth daily.     Yes [provider]  potassium chloride SA (K-DUR,KLOR-CON) 20 MEQ tablet Take 1 tablet  (20 mEq total) by mouth daily. Patient taking differently: Take 20 mEq by mouth 2 (two) times daily.  11/14/12 03/03/17 Yes Michele McalpineNadel, Scott M, MD  pravastatin (PRAVACHOL) 10 MG tablet Take 10 mg by mouth at bedtime.   Yes [provider]  spironolactone (ALDACTONE) 25 MG tablet Take 25 mg by mouth See admin instructions. On Tuesday, Thursday and Saturday at 6 pm   Yes [provider]  methylphenidate (RITALIN) 10 MG tablet Take 1 tablet (10 mg total) by mouth every morning. 03/01/17   Renae FickleShort, Mackenzie, MD   Family History No family history on file.  Social History Social History  Substance Use Topics  . Smoking status: Never Smoker  . Smokeless tobacco: Never Used  . Alcohol use No     Allergies   Morphine and Ofloxacin   Review of  Systems Review of Systems  Unable to perform ROS: Dementia   Physical Exam Updated Vital Signs BP (!) 145/58   Pulse (!) 57   Temp 97.9 F (36.6 C) (Oral)   Resp (!) 21   Ht 5\' 7"  (1.702 m)   Wt 104.3 kg (230 lb)   SpO2 100%   BMI 36.02 kg/m   Physical Exam  Constitutional: She appears well-developed and well-nourished. No distress.  HENT:  Head: Normocephalic and atraumatic.  Right Ear: Hearing normal.  Left Ear: Hearing normal.  Nose: Nose normal.  Mouth/Throat: Oropharynx is clear and moist and mucous membranes are normal.  Eyes: Pupils are equal, round, and reactive to light. Conjunctivae and EOM are normal.  Neck: Normal range of motion. Neck supple.  Cardiovascular: Regular rhythm, S1 normal and S2 normal.  Exam reveals no gallop and no friction rub.   No murmur heard. Pulmonary/Chest: Effort normal and breath sounds normal. No respiratory distress. She exhibits no tenderness.  Abdominal: Soft. Normal appearance and bowel sounds are normal. There is no hepatosplenomegaly. There is no tenderness. There is no rebound, no guarding, no tenderness at McBurney's point and negative Murphy's sign. No hernia.  Musculoskeletal:  Normal range of motion.  Neurological: She is alert. She has normal strength. No cranial nerve deficit or sensory deficit. Coordination normal. GCS eye subscore is 4. GCS verbal subscore is 5. GCS motor subscore is 6.  Disoriented  Skin: Skin is warm, dry and intact. No rash noted. No cyanosis.  Psychiatric: She has a normal mood and affect. Her speech is normal and behavior is normal. Thought content normal.  Nursing note and vitals reviewed.  ED Treatments / Results  DIAGNOSTIC STUDIES:  Oxygen Saturation is 100% on RA, normal by my interpretation.    Labs (all labs ordered are listed, but only abnormal results are displayed) Labs Reviewed  BASIC METABOLIC PANEL - Abnormal; Notable for the following:       Result Value   Glucose, Bld 131 (*)    Creatinine, Ser 1.16 (*)    GFR calc non Af Amer 43 (*)    GFR calc Af Amer 49 (*)    All other components within normal limits  CBC WITH DIFFERENTIAL/PLATELET  BRAIN NATRIURETIC PEPTIDE  I-STAT TROPONIN, ED    EKG  EKG Interpretation  Date/Time:  Sunday March 03 2017 02:43:24 EDT Ventricular Rate:  58 PR Interval:  178 QRS Duration: 84 QT Interval:  434 QTC Calculation: 426 R Axis:   20 Text Interpretation:  Sinus bradycardia Otherwise normal ECG Confirmed by Gilda Crease [phone]) on 03/03/2017 4:48:02 AM       Radiology Dg Chest 2 View  Result Date: 03/03/2017 CLINICAL DATA:  Hypoxia. EXAM: CHEST  2 VIEW COMPARISON:  02/25/2017 FINDINGS: Unchanged cardiomegaly. Normal pulmonary vasculature. No pleural effusions. No focal airspace consolidation. Mild linear lung base opacities are accentuated by the shallow inspiration but may be atelectatic. IMPRESSION: Normal pulmonary vasculature.  No consolidation or effusion. Electronically Signed   By: Ellery Plunk M.D.   On: 03/03/2017 04:14    Procedures Procedures (including critical care time)  Medications Ordered in ED Medications - No data to display   Initial  Impression / Assessment and Plan / ED Course  I have reviewed the triage vital signs and the nursing notes.  Pertinent labs & imaging results that were available during my care of the patient were reviewed by me and considered in my medical decision making (see chart for details).  Patient presents to the emergency department from nursing home. Patient was checked on by staff at nursing home, routine vitals were obtained. They reported extremely low oxygen saturation and centered to the ER for evaluation. EMS reports that she has not been short of breath and has had normal oxygen saturations during transport. This has continued here in the ER. She is in no distress, breathing comfortably. Lungs are clear. Chest x-ray is clear. All of her workup is negative, no sign of acute coronary syndrome or congestive heart failure. I suspect that this was an error in taking her vitals, we have not reproduced any hypoxia here, patient appropriate for return to the nursing home.  Final Clinical Impressions(s) / ED Diagnoses   Final diagnoses:  Hypoxia    New Prescriptions New Prescriptions   No medications on file   I personally performed the services described in this documentation, which was scribed in my presence. The recorded information has been reviewed and is accurate.    Gilda CreasePollina, Antonisha Waskey J, MD 03/03/17 [phone]

## 2017-03-03 NOTE — ED Triage Notes (Signed)
BIB EMS from [facility], called out for low O2 sats per facility, they gave her 1 duoneb. Sats noted to be 100 RA here, clear lung sounds.

## 2017-03-04 DIAGNOSIS — M24542 Contracture, left hand: Secondary | ICD-10-CM | POA: Diagnosis not present

## 2017-03-04 DIAGNOSIS — I872 Venous insufficiency (chronic) (peripheral): Secondary | ICD-10-CM | POA: Diagnosis not present

## 2017-03-04 DIAGNOSIS — F0280 Dementia in other diseases classified elsewhere without behavioral disturbance: Secondary | ICD-10-CM | POA: Diagnosis not present

## 2017-03-04 DIAGNOSIS — M6281 Muscle weakness (generalized): Secondary | ICD-10-CM | POA: Diagnosis not present

## 2017-03-04 LAB — CULTURE, BLOOD (SINGLE)
Culture: NO GROWTH
SPECIAL REQUESTS: ADEQUATE

## 2017-03-04 NOTE — Care Management Important Message (Signed)
Important Message  Patient Details  Name: Andrea PughBonnie J Allcock MRN: [id number] Date of Birth: [birth year]   Medicare Important Message Given:  Yes    Kamarius Buckbee 03/04/2017, 8:45 AM

## 2017-03-05 DIAGNOSIS — M6281 Muscle weakness (generalized): Secondary | ICD-10-CM | POA: Diagnosis not present

## 2017-03-05 DIAGNOSIS — M24542 Contracture, left hand: Secondary | ICD-10-CM | POA: Diagnosis not present

## 2017-03-05 DIAGNOSIS — I872 Venous insufficiency (chronic) (peripheral): Secondary | ICD-10-CM | POA: Diagnosis not present

## 2017-03-05 DIAGNOSIS — F0280 Dementia in other diseases classified elsewhere without behavioral disturbance: Secondary | ICD-10-CM | POA: Diagnosis not present

## 2017-03-06 DIAGNOSIS — D649 Anemia, unspecified: Secondary | ICD-10-CM | POA: Diagnosis not present

## 2017-03-06 DIAGNOSIS — E1151 Type 2 diabetes mellitus with diabetic peripheral angiopathy without gangrene: Secondary | ICD-10-CM | POA: Diagnosis not present

## 2017-03-06 DIAGNOSIS — F0280 Dementia in other diseases classified elsewhere without behavioral disturbance: Secondary | ICD-10-CM | POA: Diagnosis not present

## 2017-03-06 DIAGNOSIS — M79674 Pain in right toe(s): Secondary | ICD-10-CM | POA: Diagnosis not present

## 2017-03-06 DIAGNOSIS — I872 Venous insufficiency (chronic) (peripheral): Secondary | ICD-10-CM | POA: Diagnosis not present

## 2017-03-06 DIAGNOSIS — M24542 Contracture, left hand: Secondary | ICD-10-CM | POA: Diagnosis not present

## 2017-03-06 DIAGNOSIS — Z794 Long term (current) use of insulin: Secondary | ICD-10-CM | POA: Diagnosis not present

## 2017-03-06 DIAGNOSIS — M6281 Muscle weakness (generalized): Secondary | ICD-10-CM | POA: Diagnosis not present

## 2017-03-06 DIAGNOSIS — L603 Nail dystrophy: Secondary | ICD-10-CM | POA: Diagnosis not present

## 2017-03-06 DIAGNOSIS — B351 Tinea unguium: Secondary | ICD-10-CM | POA: Diagnosis not present

## 2017-03-07 DIAGNOSIS — L03116 Cellulitis of left lower limb: Secondary | ICD-10-CM | POA: Diagnosis not present

## 2017-03-07 DIAGNOSIS — F0280 Dementia in other diseases classified elsewhere without behavioral disturbance: Secondary | ICD-10-CM | POA: Diagnosis not present

## 2017-03-07 DIAGNOSIS — L820 Inflamed seborrheic keratosis: Secondary | ICD-10-CM | POA: Diagnosis not present

## 2017-03-07 DIAGNOSIS — I872 Venous insufficiency (chronic) (peripheral): Secondary | ICD-10-CM | POA: Diagnosis not present

## 2017-03-07 DIAGNOSIS — M6281 Muscle weakness (generalized): Secondary | ICD-10-CM | POA: Diagnosis not present

## 2017-03-07 DIAGNOSIS — M24542 Contracture, left hand: Secondary | ICD-10-CM | POA: Diagnosis not present

## 2017-03-08 DIAGNOSIS — I872 Venous insufficiency (chronic) (peripheral): Secondary | ICD-10-CM | POA: Diagnosis not present

## 2017-03-08 DIAGNOSIS — M6281 Muscle weakness (generalized): Secondary | ICD-10-CM | POA: Diagnosis not present

## 2017-03-08 DIAGNOSIS — F0280 Dementia in other diseases classified elsewhere without behavioral disturbance: Secondary | ICD-10-CM | POA: Diagnosis not present

## 2017-03-08 DIAGNOSIS — M24542 Contracture, left hand: Secondary | ICD-10-CM | POA: Diagnosis not present

## 2017-03-11 DIAGNOSIS — F0280 Dementia in other diseases classified elsewhere without behavioral disturbance: Secondary | ICD-10-CM | POA: Diagnosis not present

## 2017-03-11 DIAGNOSIS — M6281 Muscle weakness (generalized): Secondary | ICD-10-CM | POA: Diagnosis not present

## 2017-03-11 DIAGNOSIS — M24542 Contracture, left hand: Secondary | ICD-10-CM | POA: Diagnosis not present

## 2017-03-11 DIAGNOSIS — I872 Venous insufficiency (chronic) (peripheral): Secondary | ICD-10-CM | POA: Diagnosis not present

## 2017-03-12 DIAGNOSIS — M24542 Contracture, left hand: Secondary | ICD-10-CM | POA: Diagnosis not present

## 2017-03-12 DIAGNOSIS — I872 Venous insufficiency (chronic) (peripheral): Secondary | ICD-10-CM | POA: Diagnosis not present

## 2017-03-12 DIAGNOSIS — F0280 Dementia in other diseases classified elsewhere without behavioral disturbance: Secondary | ICD-10-CM | POA: Diagnosis not present

## 2017-03-12 DIAGNOSIS — M6281 Muscle weakness (generalized): Secondary | ICD-10-CM | POA: Diagnosis not present

## 2017-03-13 DIAGNOSIS — I872 Venous insufficiency (chronic) (peripheral): Secondary | ICD-10-CM | POA: Diagnosis not present

## 2017-03-13 DIAGNOSIS — M24542 Contracture, left hand: Secondary | ICD-10-CM | POA: Diagnosis not present

## 2017-03-13 DIAGNOSIS — F0280 Dementia in other diseases classified elsewhere without behavioral disturbance: Secondary | ICD-10-CM | POA: Diagnosis not present

## 2017-03-13 DIAGNOSIS — M6281 Muscle weakness (generalized): Secondary | ICD-10-CM | POA: Diagnosis not present

## 2017-03-14 DIAGNOSIS — F0280 Dementia in other diseases classified elsewhere without behavioral disturbance: Secondary | ICD-10-CM | POA: Diagnosis not present

## 2017-03-14 DIAGNOSIS — M24542 Contracture, left hand: Secondary | ICD-10-CM | POA: Diagnosis not present

## 2017-03-14 DIAGNOSIS — I872 Venous insufficiency (chronic) (peripheral): Secondary | ICD-10-CM | POA: Diagnosis not present

## 2017-03-14 DIAGNOSIS — M6281 Muscle weakness (generalized): Secondary | ICD-10-CM | POA: Diagnosis not present

## 2017-03-15 DIAGNOSIS — F0280 Dementia in other diseases classified elsewhere without behavioral disturbance: Secondary | ICD-10-CM | POA: Diagnosis not present

## 2017-03-15 DIAGNOSIS — M6281 Muscle weakness (generalized): Secondary | ICD-10-CM | POA: Diagnosis not present

## 2017-03-15 DIAGNOSIS — I872 Venous insufficiency (chronic) (peripheral): Secondary | ICD-10-CM | POA: Diagnosis not present

## 2017-03-15 DIAGNOSIS — M24542 Contracture, left hand: Secondary | ICD-10-CM | POA: Diagnosis not present

## 2017-03-17 DIAGNOSIS — S90821A Blister (nonthermal), right foot, initial encounter: Secondary | ICD-10-CM | POA: Diagnosis not present

## 2017-03-19 ENCOUNTER — Other Ambulatory Visit: Payer: Self-pay | Admitting: *Deleted

## 2017-03-19 DIAGNOSIS — D649 Anemia, unspecified: Secondary | ICD-10-CM | POA: Diagnosis not present

## 2017-03-19 NOTE — Patient Outreach (Signed)
[facility]) Care Management  03/19/2017  Andrea Mccoy Dec 01, 1931 [id number]   Met with [name], SW at facility, she reports that patient is a LTC resident of facility. No plans to discharge home.  Plan to sign off. Royetta Crochet. Laymond Purser, RN, BSN, [place] [phone]) Business Cell  [phone]) Toll Free Office

## 2017-03-20 DIAGNOSIS — M24542 Contracture, left hand: Secondary | ICD-10-CM | POA: Diagnosis not present

## 2017-03-20 DIAGNOSIS — I872 Venous insufficiency (chronic) (peripheral): Secondary | ICD-10-CM | POA: Diagnosis not present

## 2017-03-20 DIAGNOSIS — M6281 Muscle weakness (generalized): Secondary | ICD-10-CM | POA: Diagnosis not present

## 2017-03-20 DIAGNOSIS — F0280 Dementia in other diseases classified elsewhere without behavioral disturbance: Secondary | ICD-10-CM | POA: Diagnosis not present

## 2017-03-21 DIAGNOSIS — I872 Venous insufficiency (chronic) (peripheral): Secondary | ICD-10-CM | POA: Diagnosis not present

## 2017-03-21 DIAGNOSIS — M6281 Muscle weakness (generalized): Secondary | ICD-10-CM | POA: Diagnosis not present

## 2017-03-21 DIAGNOSIS — F0280 Dementia in other diseases classified elsewhere without behavioral disturbance: Secondary | ICD-10-CM | POA: Diagnosis not present

## 2017-03-21 DIAGNOSIS — M24542 Contracture, left hand: Secondary | ICD-10-CM | POA: Diagnosis not present

## 2017-03-22 DIAGNOSIS — F0280 Dementia in other diseases classified elsewhere without behavioral disturbance: Secondary | ICD-10-CM | POA: Diagnosis not present

## 2017-03-22 DIAGNOSIS — M24542 Contracture, left hand: Secondary | ICD-10-CM | POA: Diagnosis not present

## 2017-03-22 DIAGNOSIS — I872 Venous insufficiency (chronic) (peripheral): Secondary | ICD-10-CM | POA: Diagnosis not present

## 2017-03-22 DIAGNOSIS — M6281 Muscle weakness (generalized): Secondary | ICD-10-CM | POA: Diagnosis not present

## 2017-03-25 DIAGNOSIS — I872 Venous insufficiency (chronic) (peripheral): Secondary | ICD-10-CM | POA: Diagnosis not present

## 2017-03-25 DIAGNOSIS — M24542 Contracture, left hand: Secondary | ICD-10-CM | POA: Diagnosis not present

## 2017-03-25 DIAGNOSIS — M6281 Muscle weakness (generalized): Secondary | ICD-10-CM | POA: Diagnosis not present

## 2017-03-25 DIAGNOSIS — F0280 Dementia in other diseases classified elsewhere without behavioral disturbance: Secondary | ICD-10-CM | POA: Diagnosis not present

## 2017-03-27 DIAGNOSIS — M6281 Muscle weakness (generalized): Secondary | ICD-10-CM | POA: Diagnosis not present

## 2017-03-27 DIAGNOSIS — I872 Venous insufficiency (chronic) (peripheral): Secondary | ICD-10-CM | POA: Diagnosis not present

## 2017-03-27 DIAGNOSIS — F0280 Dementia in other diseases classified elsewhere without behavioral disturbance: Secondary | ICD-10-CM | POA: Diagnosis not present

## 2017-03-27 DIAGNOSIS — M24542 Contracture, left hand: Secondary | ICD-10-CM | POA: Diagnosis not present

## 2017-03-29 DIAGNOSIS — F0280 Dementia in other diseases classified elsewhere without behavioral disturbance: Secondary | ICD-10-CM | POA: Diagnosis not present

## 2017-03-29 DIAGNOSIS — M6281 Muscle weakness (generalized): Secondary | ICD-10-CM | POA: Diagnosis not present

## 2017-03-29 DIAGNOSIS — I872 Venous insufficiency (chronic) (peripheral): Secondary | ICD-10-CM | POA: Diagnosis not present

## 2017-03-29 DIAGNOSIS — M24542 Contracture, left hand: Secondary | ICD-10-CM | POA: Diagnosis not present

## 2017-04-09 DIAGNOSIS — L97511 Non-pressure chronic ulcer of other part of right foot limited to breakdown of skin: Secondary | ICD-10-CM | POA: Diagnosis not present

## 2017-04-09 DIAGNOSIS — L988 Other specified disorders of the skin and subcutaneous tissue: Secondary | ICD-10-CM | POA: Diagnosis not present

## 2017-04-16 DIAGNOSIS — L97511 Non-pressure chronic ulcer of other part of right foot limited to breakdown of skin: Secondary | ICD-10-CM | POA: Diagnosis not present

## 2017-04-16 DIAGNOSIS — L988 Other specified disorders of the skin and subcutaneous tissue: Secondary | ICD-10-CM | POA: Diagnosis not present

## 2017-04-23 DIAGNOSIS — L988 Other specified disorders of the skin and subcutaneous tissue: Secondary | ICD-10-CM | POA: Diagnosis not present

## 2017-04-23 DIAGNOSIS — L97511 Non-pressure chronic ulcer of other part of right foot limited to breakdown of skin: Secondary | ICD-10-CM | POA: Diagnosis not present

## 2017-05-01 DIAGNOSIS — L988 Other specified disorders of the skin and subcutaneous tissue: Secondary | ICD-10-CM | POA: Diagnosis not present

## 2017-05-01 DIAGNOSIS — L97511 Non-pressure chronic ulcer of other part of right foot limited to breakdown of skin: Secondary | ICD-10-CM | POA: Diagnosis not present

## 2017-05-07 DIAGNOSIS — S91001A Unspecified open wound, right ankle, initial encounter: Secondary | ICD-10-CM | POA: Diagnosis not present

## 2017-05-07 DIAGNOSIS — L97511 Non-pressure chronic ulcer of other part of right foot limited to breakdown of skin: Secondary | ICD-10-CM | POA: Diagnosis not present

## 2017-05-07 DIAGNOSIS — L988 Other specified disorders of the skin and subcutaneous tissue: Secondary | ICD-10-CM | POA: Diagnosis not present

## 2017-05-07 DIAGNOSIS — S91001D Unspecified open wound, right ankle, subsequent encounter: Secondary | ICD-10-CM | POA: Diagnosis not present

## 2017-05-09 DIAGNOSIS — R54 Age-related physical debility: Secondary | ICD-10-CM | POA: Diagnosis not present

## 2017-05-09 DIAGNOSIS — R293 Abnormal posture: Secondary | ICD-10-CM | POA: Diagnosis not present

## 2017-05-09 DIAGNOSIS — R278 Other lack of coordination: Secondary | ICD-10-CM | POA: Diagnosis not present

## 2017-05-14 DIAGNOSIS — R54 Age-related physical debility: Secondary | ICD-10-CM | POA: Diagnosis not present

## 2017-05-14 DIAGNOSIS — R293 Abnormal posture: Secondary | ICD-10-CM | POA: Diagnosis not present

## 2017-05-14 DIAGNOSIS — R278 Other lack of coordination: Secondary | ICD-10-CM | POA: Diagnosis not present

## 2017-05-14 DIAGNOSIS — S91001A Unspecified open wound, right ankle, initial encounter: Secondary | ICD-10-CM | POA: Diagnosis not present

## 2017-05-14 DIAGNOSIS — S91001D Unspecified open wound, right ankle, subsequent encounter: Secondary | ICD-10-CM | POA: Diagnosis not present

## 2017-05-14 DIAGNOSIS — L988 Other specified disorders of the skin and subcutaneous tissue: Secondary | ICD-10-CM | POA: Diagnosis not present

## 2017-05-14 DIAGNOSIS — L97511 Non-pressure chronic ulcer of other part of right foot limited to breakdown of skin: Secondary | ICD-10-CM | POA: Diagnosis not present

## 2017-05-16 DIAGNOSIS — R54 Age-related physical debility: Secondary | ICD-10-CM | POA: Diagnosis not present

## 2017-05-16 DIAGNOSIS — R293 Abnormal posture: Secondary | ICD-10-CM | POA: Diagnosis not present

## 2017-05-16 DIAGNOSIS — R278 Other lack of coordination: Secondary | ICD-10-CM | POA: Diagnosis not present

## 2017-05-17 DIAGNOSIS — R278 Other lack of coordination: Secondary | ICD-10-CM | POA: Diagnosis not present

## 2017-05-17 DIAGNOSIS — R293 Abnormal posture: Secondary | ICD-10-CM | POA: Diagnosis not present

## 2017-05-17 DIAGNOSIS — R54 Age-related physical debility: Secondary | ICD-10-CM | POA: Diagnosis not present

## 2017-05-21 DIAGNOSIS — L97511 Non-pressure chronic ulcer of other part of right foot limited to breakdown of skin: Secondary | ICD-10-CM | POA: Diagnosis not present

## 2017-05-21 DIAGNOSIS — S91001D Unspecified open wound, right ankle, subsequent encounter: Secondary | ICD-10-CM | POA: Diagnosis not present

## 2017-05-21 DIAGNOSIS — L988 Other specified disorders of the skin and subcutaneous tissue: Secondary | ICD-10-CM | POA: Diagnosis not present

## 2017-05-21 DIAGNOSIS — S91001A Unspecified open wound, right ankle, initial encounter: Secondary | ICD-10-CM | POA: Diagnosis not present

## 2017-05-22 DIAGNOSIS — R54 Age-related physical debility: Secondary | ICD-10-CM | POA: Diagnosis not present

## 2017-05-22 DIAGNOSIS — R278 Other lack of coordination: Secondary | ICD-10-CM | POA: Diagnosis not present

## 2017-05-22 DIAGNOSIS — R293 Abnormal posture: Secondary | ICD-10-CM | POA: Diagnosis not present

## 2017-05-28 DIAGNOSIS — S91001A Unspecified open wound, right ankle, initial encounter: Secondary | ICD-10-CM | POA: Diagnosis not present

## 2017-05-28 DIAGNOSIS — S91001D Unspecified open wound, right ankle, subsequent encounter: Secondary | ICD-10-CM | POA: Diagnosis not present

## 2017-05-28 DIAGNOSIS — L988 Other specified disorders of the skin and subcutaneous tissue: Secondary | ICD-10-CM | POA: Diagnosis not present

## 2017-05-28 DIAGNOSIS — L97511 Non-pressure chronic ulcer of other part of right foot limited to breakdown of skin: Secondary | ICD-10-CM | POA: Diagnosis not present

## 2017-05-31 DIAGNOSIS — B351 Tinea unguium: Secondary | ICD-10-CM | POA: Diagnosis not present

## 2017-05-31 DIAGNOSIS — L603 Nail dystrophy: Secondary | ICD-10-CM | POA: Diagnosis not present

## 2017-05-31 DIAGNOSIS — E1151 Type 2 diabetes mellitus with diabetic peripheral angiopathy without gangrene: Secondary | ICD-10-CM | POA: Diagnosis not present

## 2017-05-31 DIAGNOSIS — I69993 Ataxia following unspecified cerebrovascular disease: Secondary | ICD-10-CM | POA: Diagnosis not present

## 2017-05-31 DIAGNOSIS — Z794 Long term (current) use of insulin: Secondary | ICD-10-CM | POA: Diagnosis not present

## 2017-06-07 DIAGNOSIS — L988 Other specified disorders of the skin and subcutaneous tissue: Secondary | ICD-10-CM | POA: Diagnosis not present

## 2017-06-11 DIAGNOSIS — L988 Other specified disorders of the skin and subcutaneous tissue: Secondary | ICD-10-CM | POA: Diagnosis not present

## 2017-06-16 DIAGNOSIS — E1129 Type 2 diabetes mellitus with other diabetic kidney complication: Secondary | ICD-10-CM | POA: Diagnosis not present

## 2017-06-16 DIAGNOSIS — E038 Other specified hypothyroidism: Secondary | ICD-10-CM | POA: Diagnosis not present

## 2017-06-16 DIAGNOSIS — I82409 Acute embolism and thrombosis of unspecified deep veins of unspecified lower extremity: Secondary | ICD-10-CM | POA: Diagnosis not present

## 2017-06-16 DIAGNOSIS — M1990 Unspecified osteoarthritis, unspecified site: Secondary | ICD-10-CM | POA: Diagnosis not present

## 2017-06-16 DIAGNOSIS — N184 Chronic kidney disease, stage 4 (severe): Secondary | ICD-10-CM | POA: Diagnosis not present

## 2017-06-16 DIAGNOSIS — G309 Alzheimer's disease, unspecified: Secondary | ICD-10-CM | POA: Diagnosis not present

## 2017-06-16 DIAGNOSIS — I10 Essential (primary) hypertension: Secondary | ICD-10-CM | POA: Diagnosis not present

## 2017-06-16 DIAGNOSIS — I2510 Atherosclerotic heart disease of native coronary artery without angina pectoris: Secondary | ICD-10-CM | POA: Diagnosis not present

## 2017-06-18 DIAGNOSIS — E119 Type 2 diabetes mellitus without complications: Secondary | ICD-10-CM | POA: Diagnosis not present

## 2017-06-18 DIAGNOSIS — L988 Other specified disorders of the skin and subcutaneous tissue: Secondary | ICD-10-CM | POA: Diagnosis not present

## 2017-06-18 DIAGNOSIS — E039 Hypothyroidism, unspecified: Secondary | ICD-10-CM | POA: Diagnosis not present

## 2017-06-24 DIAGNOSIS — L988 Other specified disorders of the skin and subcutaneous tissue: Secondary | ICD-10-CM | POA: Diagnosis not present

## 2017-07-02 DIAGNOSIS — L988 Other specified disorders of the skin and subcutaneous tissue: Secondary | ICD-10-CM | POA: Diagnosis not present

## 2017-07-07 DIAGNOSIS — G309 Alzheimer's disease, unspecified: Secondary | ICD-10-CM | POA: Diagnosis not present

## 2017-07-07 DIAGNOSIS — I2510 Atherosclerotic heart disease of native coronary artery without angina pectoris: Secondary | ICD-10-CM | POA: Diagnosis not present

## 2017-07-07 DIAGNOSIS — R1319 Other dysphagia: Secondary | ICD-10-CM | POA: Diagnosis not present

## 2017-07-09 DIAGNOSIS — L988 Other specified disorders of the skin and subcutaneous tissue: Secondary | ICD-10-CM | POA: Diagnosis not present

## 2017-07-17 DIAGNOSIS — L988 Other specified disorders of the skin and subcutaneous tissue: Secondary | ICD-10-CM | POA: Diagnosis not present

## 2017-07-23 DIAGNOSIS — L988 Other specified disorders of the skin and subcutaneous tissue: Secondary | ICD-10-CM | POA: Diagnosis not present

## 2017-09-04 DIAGNOSIS — E1051 Type 1 diabetes mellitus with diabetic peripheral angiopathy without gangrene: Secondary | ICD-10-CM | POA: Diagnosis not present

## 2017-09-04 DIAGNOSIS — B351 Tinea unguium: Secondary | ICD-10-CM | POA: Diagnosis not present

## 2017-09-04 DIAGNOSIS — R262 Difficulty in walking, not elsewhere classified: Secondary | ICD-10-CM | POA: Diagnosis not present

## 2017-09-19 DIAGNOSIS — Z794 Long term (current) use of insulin: Secondary | ICD-10-CM | POA: Diagnosis not present

## 2017-09-19 DIAGNOSIS — Z961 Presence of intraocular lens: Secondary | ICD-10-CM | POA: Diagnosis not present

## 2017-09-19 DIAGNOSIS — H04123 Dry eye syndrome of bilateral lacrimal glands: Secondary | ICD-10-CM | POA: Diagnosis not present

## 2017-09-19 DIAGNOSIS — E119 Type 2 diabetes mellitus without complications: Secondary | ICD-10-CM | POA: Diagnosis not present

## 2017-10-20 DIAGNOSIS — R1319 Other dysphagia: Secondary | ICD-10-CM | POA: Diagnosis not present

## 2017-10-20 DIAGNOSIS — I10 Essential (primary) hypertension: Secondary | ICD-10-CM | POA: Diagnosis not present

## 2017-10-20 DIAGNOSIS — G309 Alzheimer's disease, unspecified: Secondary | ICD-10-CM | POA: Diagnosis not present

## 2017-10-20 DIAGNOSIS — I2510 Atherosclerotic heart disease of native coronary artery without angina pectoris: Secondary | ICD-10-CM | POA: Diagnosis not present

## 2017-11-13 DIAGNOSIS — B351 Tinea unguium: Secondary | ICD-10-CM | POA: Diagnosis not present

## 2017-11-13 DIAGNOSIS — R262 Difficulty in walking, not elsewhere classified: Secondary | ICD-10-CM | POA: Diagnosis not present

## 2017-11-13 DIAGNOSIS — E1051 Type 1 diabetes mellitus with diabetic peripheral angiopathy without gangrene: Secondary | ICD-10-CM | POA: Diagnosis not present

## 2017-12-03 IMAGING — MR MR FOOT*L* W/O CM
4 of 5 series · 19 of 40 positions shown · non-contrast
Comparison: None.

CLINICAL DATA: Erythema left foot.

EXAM:
MRI OF THE LEFT FOOT WITHOUT CONTRAST
TECHNIQUE: Multiplanar, multisequence MR imaging of the left foot was
performed. No intravenous contrast was administered.

[Series 5: T1 · coronal · 4.0mm · 0.29mm/px · 3 of 43 slices shown (1 of 2)]
[im 5/43]
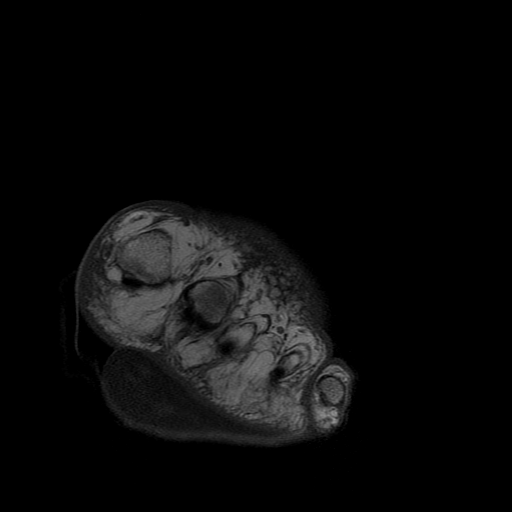
[im 22/43]
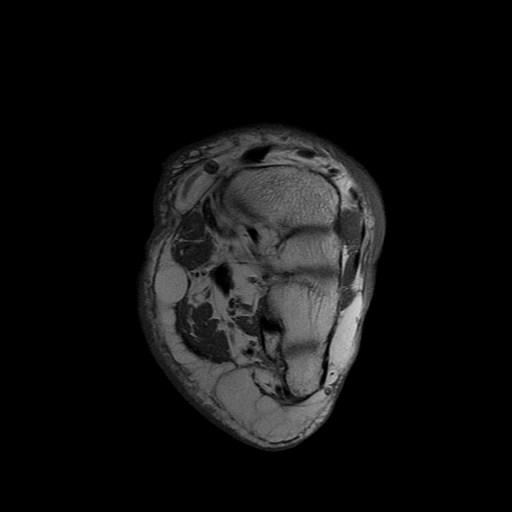
[im 38/43]
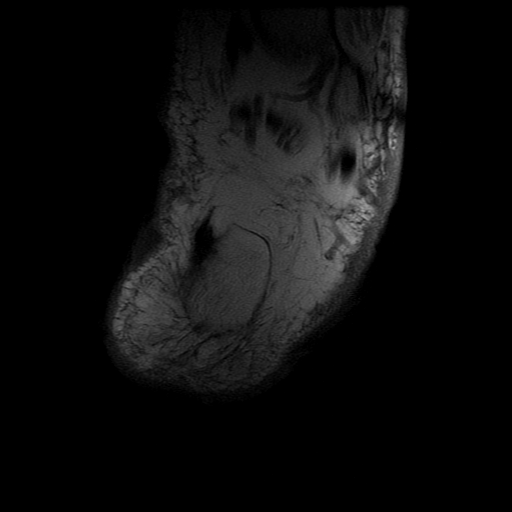

[Series 6: T2 fat-sat · coronal · 4.0mm · 0.29mm/px · 10 of 44 slices shown]
[im 1/44]
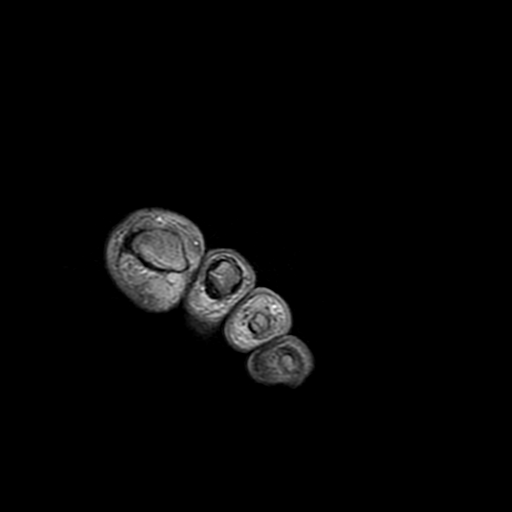
[im 5/44]
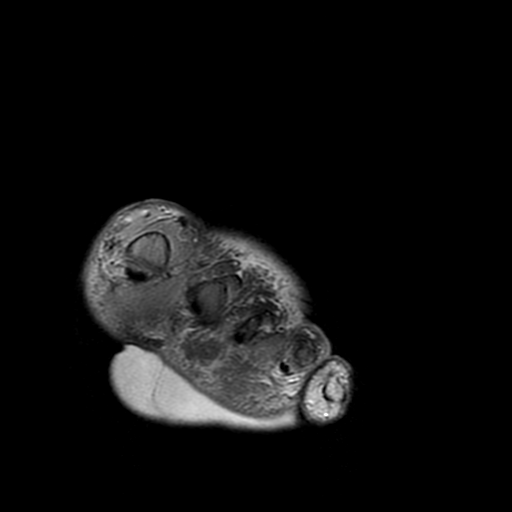
[im 9/44]
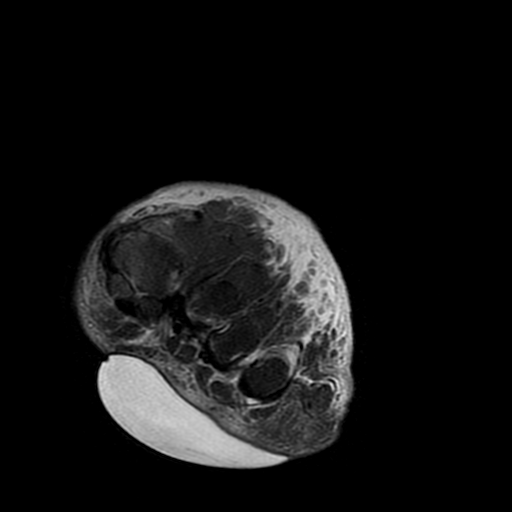
[im 13/44]
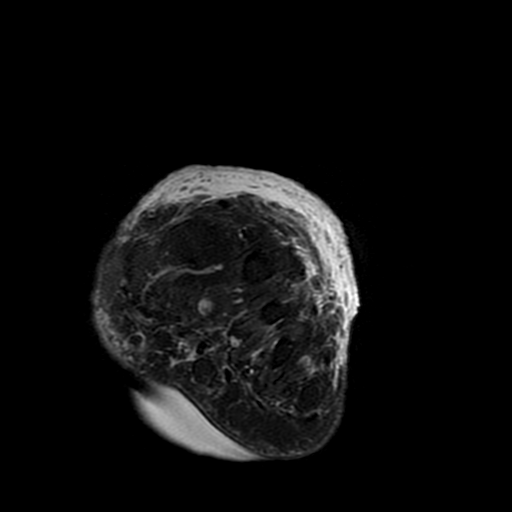
[im 18/44]
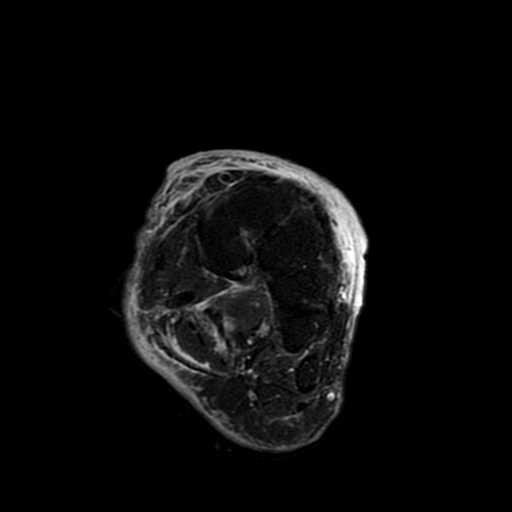
[im 22/44]
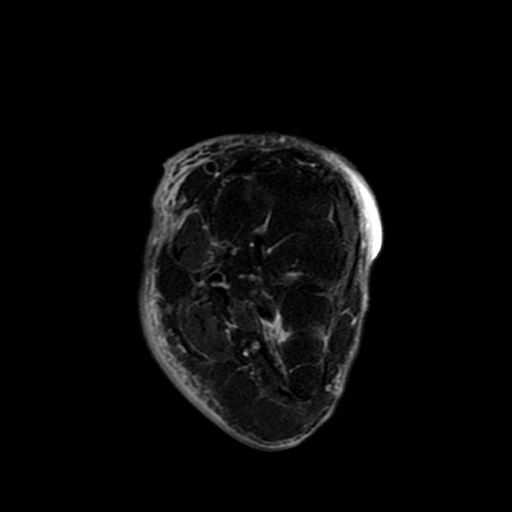
[im 26/44]
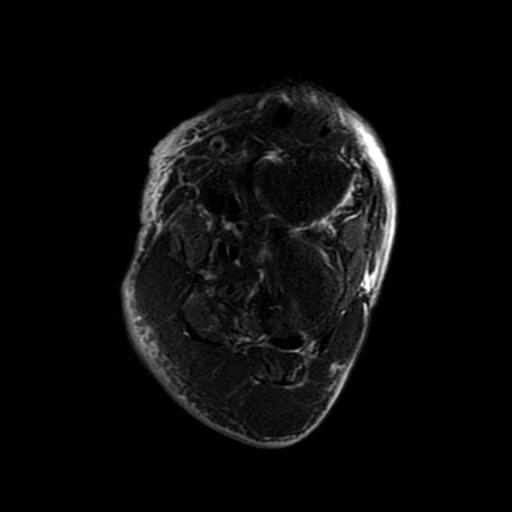
[im 31/44]
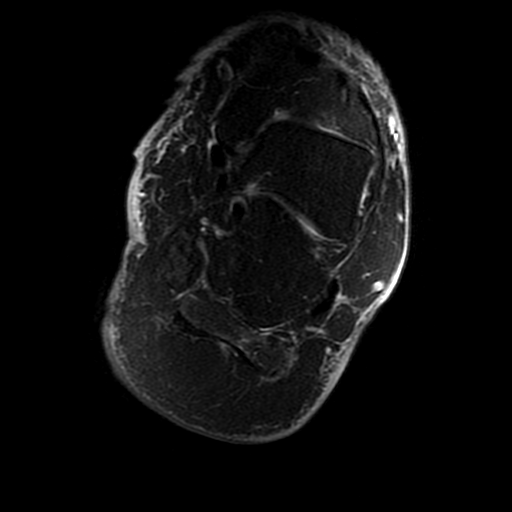
[im 35/44]
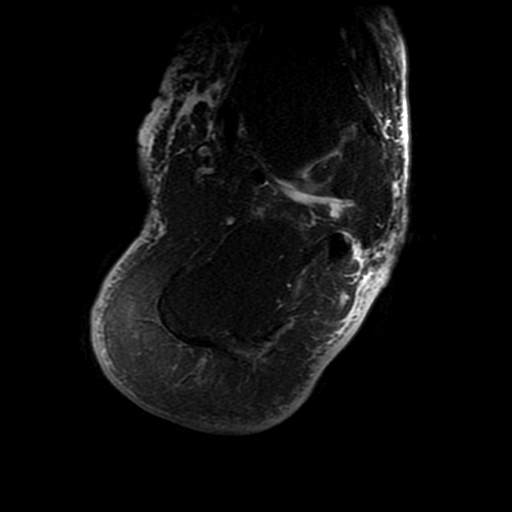
[im 39/44]
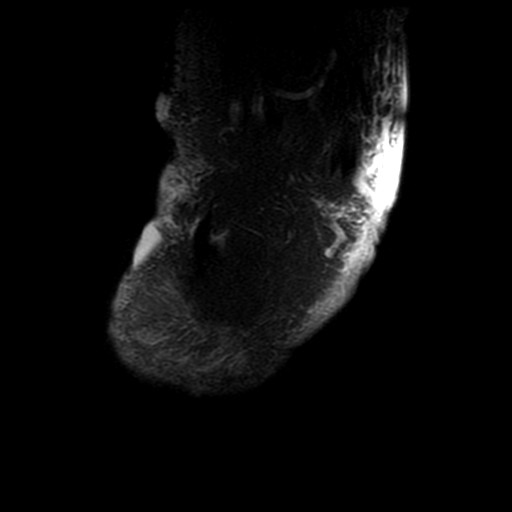

[Series 7: T1 · oblique · 4.0mm · 0.45mm/px · 3 of 23 slices shown (2 of 2)]
[im 5/23]
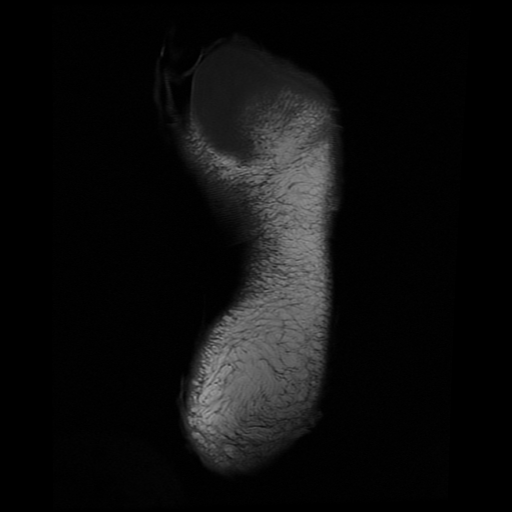
[im 14/23]
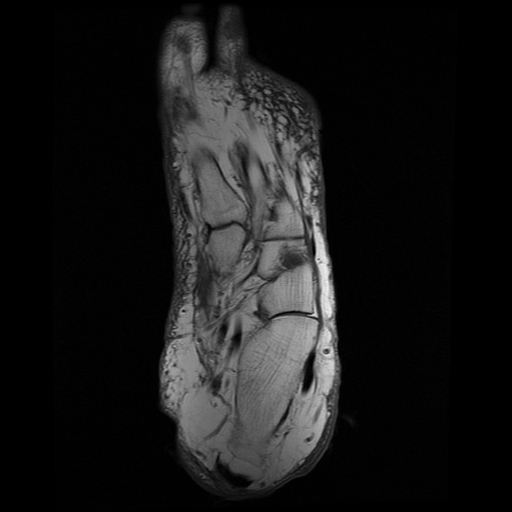
[im 23/23]
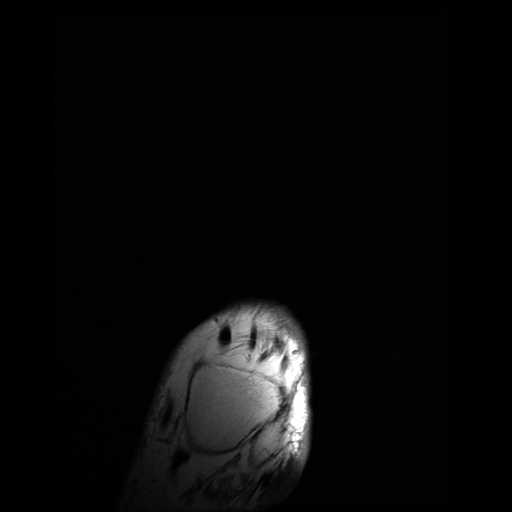

[Series 8: STIR · oblique · 4.0mm · 0.45mm/px · 3 of 23 slices shown]
[im 5/23]
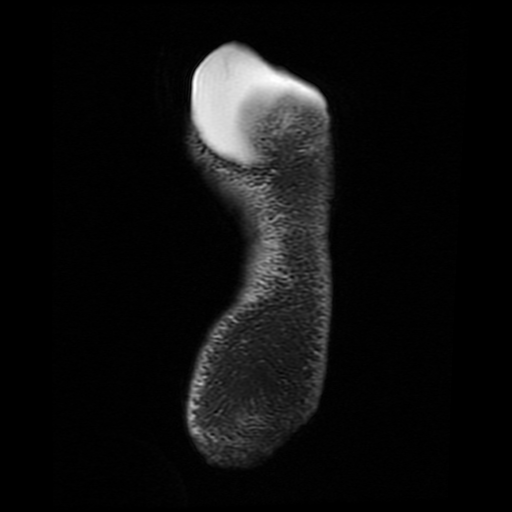
[im 14/23]
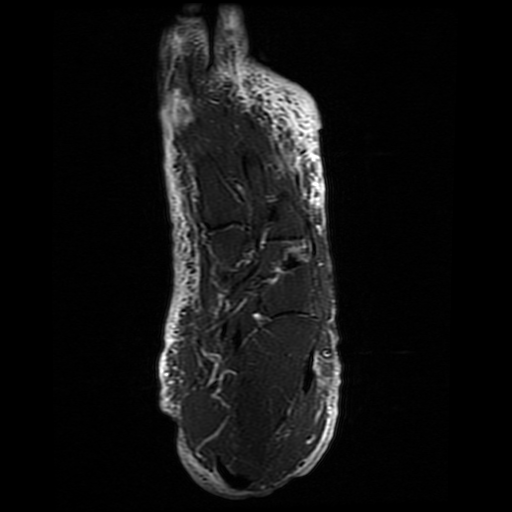
[im 23/23]
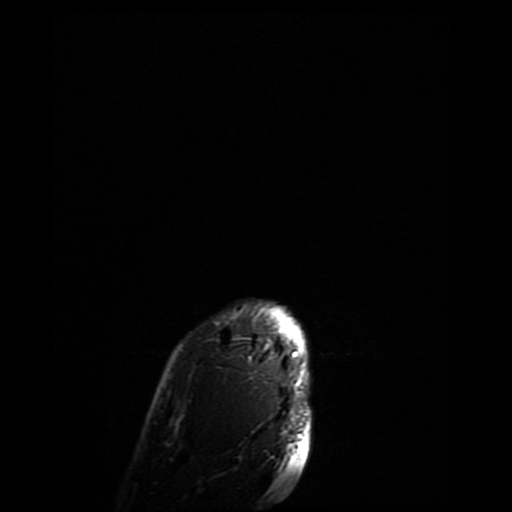

[19 of 40 positions shown; findings below may reference images not displayed]

FINDINGS: Bones/Joint/Cartilage

No marrow signal abnormality. No fracture or dislocation. Normal
alignment. No joint effusion. Mild osteoarthritis of the first MTP
joint.

Ligaments

Collateral ligaments are intact. Intact Lisfranc ligament. Anterior
and posterior talofibular ligaments are intact. Anterior and
posterior tibiofibular ligaments are intact. Deltoid ligament is
intact. Plantar fascia is intact.

Muscles and Tendons
Flexor, peroneal and extensor compartment tendons are intact.
Achilles tendon is intact. Muscles are normal.

Soft tissue
No hematoma. No soft tissue mass. T2 hyperintense Skin blister along
the plantar aspect of the forefoot measuring 6 x 1.7 x 6.4 cm. T2
hyperintense Small skin blister along the posterior aspect of the
calcaneus measuring 3.8 x 0.7 x 0.5 cm. Generalized soft tissue
edema throughout the dorsal aspect of the foot which can be seen
with cellulitis.
IMPRESSION: 1. Generalized soft tissue edema throughout the dorsal aspect of the
foot which can be seen with cellulitis.
2. Skin blister along the plantar aspect of the forefoot measuring 6
x 1.7 x 6.4 cm. Small skin blister along the posterior aspect of the
calcaneus measuring 3.8 x 0.7 x 0.5 cm.

## 2018-02-12 DIAGNOSIS — E1051 Type 1 diabetes mellitus with diabetic peripheral angiopathy without gangrene: Secondary | ICD-10-CM | POA: Diagnosis not present

## 2018-02-12 DIAGNOSIS — B351 Tinea unguium: Secondary | ICD-10-CM | POA: Diagnosis not present

## 2018-04-05 DIAGNOSIS — E1151 Type 2 diabetes mellitus with diabetic peripheral angiopathy without gangrene: Secondary | ICD-10-CM | POA: Diagnosis not present

## 2018-04-05 DIAGNOSIS — G309 Alzheimer's disease, unspecified: Secondary | ICD-10-CM | POA: Diagnosis not present

## 2018-04-05 DIAGNOSIS — I2510 Atherosclerotic heart disease of native coronary artery without angina pectoris: Secondary | ICD-10-CM | POA: Diagnosis not present

## 2018-04-17 DIAGNOSIS — B351 Tinea unguium: Secondary | ICD-10-CM | POA: Diagnosis not present

## 2018-04-17 DIAGNOSIS — E119 Type 2 diabetes mellitus without complications: Secondary | ICD-10-CM | POA: Diagnosis not present

## 2018-05-06 DIAGNOSIS — M24542 Contracture, left hand: Secondary | ICD-10-CM | POA: Diagnosis not present

## 2018-05-06 DIAGNOSIS — R293 Abnormal posture: Secondary | ICD-10-CM | POA: Diagnosis not present

## 2018-05-06 DIAGNOSIS — Z8673 Personal history of transient ischemic attack (TIA), and cerebral infarction without residual deficits: Secondary | ICD-10-CM | POA: Diagnosis not present

## 2018-05-06 DIAGNOSIS — R1312 Dysphagia, oropharyngeal phase: Secondary | ICD-10-CM | POA: Diagnosis not present

## 2018-05-06 DIAGNOSIS — A419 Sepsis, unspecified organism: Secondary | ICD-10-CM | POA: Diagnosis not present

## 2018-05-06 DIAGNOSIS — G309 Alzheimer's disease, unspecified: Secondary | ICD-10-CM | POA: Diagnosis not present

## 2018-05-07 DIAGNOSIS — A419 Sepsis, unspecified organism: Secondary | ICD-10-CM | POA: Diagnosis not present

## 2018-05-07 DIAGNOSIS — R1312 Dysphagia, oropharyngeal phase: Secondary | ICD-10-CM | POA: Diagnosis not present

## 2018-05-07 DIAGNOSIS — M24542 Contracture, left hand: Secondary | ICD-10-CM | POA: Diagnosis not present

## 2018-05-07 DIAGNOSIS — G309 Alzheimer's disease, unspecified: Secondary | ICD-10-CM | POA: Diagnosis not present

## 2018-05-07 DIAGNOSIS — R293 Abnormal posture: Secondary | ICD-10-CM | POA: Diagnosis not present

## 2018-05-07 DIAGNOSIS — Z8673 Personal history of transient ischemic attack (TIA), and cerebral infarction without residual deficits: Secondary | ICD-10-CM | POA: Diagnosis not present

## 2018-05-09 DIAGNOSIS — R1312 Dysphagia, oropharyngeal phase: Secondary | ICD-10-CM | POA: Diagnosis not present

## 2018-05-09 DIAGNOSIS — Z8673 Personal history of transient ischemic attack (TIA), and cerebral infarction without residual deficits: Secondary | ICD-10-CM | POA: Diagnosis not present

## 2018-05-09 DIAGNOSIS — G309 Alzheimer's disease, unspecified: Secondary | ICD-10-CM | POA: Diagnosis not present

## 2018-05-09 DIAGNOSIS — A419 Sepsis, unspecified organism: Secondary | ICD-10-CM | POA: Diagnosis not present

## 2018-05-09 DIAGNOSIS — R293 Abnormal posture: Secondary | ICD-10-CM | POA: Diagnosis not present

## 2018-05-09 DIAGNOSIS — M24542 Contracture, left hand: Secondary | ICD-10-CM | POA: Diagnosis not present

## 2018-05-14 DIAGNOSIS — A419 Sepsis, unspecified organism: Secondary | ICD-10-CM | POA: Diagnosis not present

## 2018-05-14 DIAGNOSIS — G309 Alzheimer's disease, unspecified: Secondary | ICD-10-CM | POA: Diagnosis not present

## 2018-05-14 DIAGNOSIS — Z8673 Personal history of transient ischemic attack (TIA), and cerebral infarction without residual deficits: Secondary | ICD-10-CM | POA: Diagnosis not present

## 2018-05-14 DIAGNOSIS — R1312 Dysphagia, oropharyngeal phase: Secondary | ICD-10-CM | POA: Diagnosis not present

## 2018-05-14 DIAGNOSIS — M24542 Contracture, left hand: Secondary | ICD-10-CM | POA: Diagnosis not present

## 2018-05-14 DIAGNOSIS — R293 Abnormal posture: Secondary | ICD-10-CM | POA: Diagnosis not present

## 2018-05-15 DIAGNOSIS — G309 Alzheimer's disease, unspecified: Secondary | ICD-10-CM | POA: Diagnosis not present

## 2018-05-15 DIAGNOSIS — R293 Abnormal posture: Secondary | ICD-10-CM | POA: Diagnosis not present

## 2018-05-15 DIAGNOSIS — A419 Sepsis, unspecified organism: Secondary | ICD-10-CM | POA: Diagnosis not present

## 2018-05-15 DIAGNOSIS — R1312 Dysphagia, oropharyngeal phase: Secondary | ICD-10-CM | POA: Diagnosis not present

## 2018-05-15 DIAGNOSIS — M24542 Contracture, left hand: Secondary | ICD-10-CM | POA: Diagnosis not present

## 2018-05-15 DIAGNOSIS — Z8673 Personal history of transient ischemic attack (TIA), and cerebral infarction without residual deficits: Secondary | ICD-10-CM | POA: Diagnosis not present

## 2018-05-16 DIAGNOSIS — G309 Alzheimer's disease, unspecified: Secondary | ICD-10-CM | POA: Diagnosis not present

## 2018-05-16 DIAGNOSIS — R1312 Dysphagia, oropharyngeal phase: Secondary | ICD-10-CM | POA: Diagnosis not present

## 2018-05-16 DIAGNOSIS — A419 Sepsis, unspecified organism: Secondary | ICD-10-CM | POA: Diagnosis not present

## 2018-05-16 DIAGNOSIS — M24542 Contracture, left hand: Secondary | ICD-10-CM | POA: Diagnosis not present

## 2018-05-16 DIAGNOSIS — Z8673 Personal history of transient ischemic attack (TIA), and cerebral infarction without residual deficits: Secondary | ICD-10-CM | POA: Diagnosis not present

## 2018-05-16 DIAGNOSIS — R293 Abnormal posture: Secondary | ICD-10-CM | POA: Diagnosis not present

## 2018-05-19 DIAGNOSIS — A419 Sepsis, unspecified organism: Secondary | ICD-10-CM | POA: Diagnosis not present

## 2018-05-19 DIAGNOSIS — G309 Alzheimer's disease, unspecified: Secondary | ICD-10-CM | POA: Diagnosis not present

## 2018-05-19 DIAGNOSIS — R293 Abnormal posture: Secondary | ICD-10-CM | POA: Diagnosis not present

## 2018-05-19 DIAGNOSIS — R1312 Dysphagia, oropharyngeal phase: Secondary | ICD-10-CM | POA: Diagnosis not present

## 2018-05-19 DIAGNOSIS — Z8673 Personal history of transient ischemic attack (TIA), and cerebral infarction without residual deficits: Secondary | ICD-10-CM | POA: Diagnosis not present

## 2018-05-19 DIAGNOSIS — M24542 Contracture, left hand: Secondary | ICD-10-CM | POA: Diagnosis not present

## 2018-05-20 DIAGNOSIS — A419 Sepsis, unspecified organism: Secondary | ICD-10-CM | POA: Diagnosis not present

## 2018-05-20 DIAGNOSIS — G309 Alzheimer's disease, unspecified: Secondary | ICD-10-CM | POA: Diagnosis not present

## 2018-05-20 DIAGNOSIS — R293 Abnormal posture: Secondary | ICD-10-CM | POA: Diagnosis not present

## 2018-05-20 DIAGNOSIS — R1312 Dysphagia, oropharyngeal phase: Secondary | ICD-10-CM | POA: Diagnosis not present

## 2018-05-20 DIAGNOSIS — M24542 Contracture, left hand: Secondary | ICD-10-CM | POA: Diagnosis not present

## 2018-05-20 DIAGNOSIS — Z8673 Personal history of transient ischemic attack (TIA), and cerebral infarction without residual deficits: Secondary | ICD-10-CM | POA: Diagnosis not present

## 2018-05-22 DIAGNOSIS — G309 Alzheimer's disease, unspecified: Secondary | ICD-10-CM | POA: Diagnosis not present

## 2018-05-22 DIAGNOSIS — R293 Abnormal posture: Secondary | ICD-10-CM | POA: Diagnosis not present

## 2018-05-22 DIAGNOSIS — A419 Sepsis, unspecified organism: Secondary | ICD-10-CM | POA: Diagnosis not present

## 2018-05-22 DIAGNOSIS — M24542 Contracture, left hand: Secondary | ICD-10-CM | POA: Diagnosis not present

## 2018-05-22 DIAGNOSIS — R1312 Dysphagia, oropharyngeal phase: Secondary | ICD-10-CM | POA: Diagnosis not present

## 2018-05-22 DIAGNOSIS — Z8673 Personal history of transient ischemic attack (TIA), and cerebral infarction without residual deficits: Secondary | ICD-10-CM | POA: Diagnosis not present

## 2018-06-04 DIAGNOSIS — I509 Heart failure, unspecified: Secondary | ICD-10-CM | POA: Diagnosis not present

## 2018-06-04 DIAGNOSIS — R05 Cough: Secondary | ICD-10-CM | POA: Diagnosis not present

## 2018-06-04 DIAGNOSIS — D649 Anemia, unspecified: Secondary | ICD-10-CM | POA: Diagnosis not present

## 2018-06-05 DIAGNOSIS — R293 Abnormal posture: Secondary | ICD-10-CM | POA: Diagnosis not present

## 2018-06-05 DIAGNOSIS — G309 Alzheimer's disease, unspecified: Secondary | ICD-10-CM | POA: Diagnosis not present

## 2018-06-05 DIAGNOSIS — M24542 Contracture, left hand: Secondary | ICD-10-CM | POA: Diagnosis not present

## 2018-06-05 DIAGNOSIS — Z8673 Personal history of transient ischemic attack (TIA), and cerebral infarction without residual deficits: Secondary | ICD-10-CM | POA: Diagnosis not present

## 2018-06-05 DIAGNOSIS — A419 Sepsis, unspecified organism: Secondary | ICD-10-CM | POA: Diagnosis not present

## 2018-06-05 DIAGNOSIS — R1312 Dysphagia, oropharyngeal phase: Secondary | ICD-10-CM | POA: Diagnosis not present

## 2018-06-06 DIAGNOSIS — Z8673 Personal history of transient ischemic attack (TIA), and cerebral infarction without residual deficits: Secondary | ICD-10-CM | POA: Diagnosis not present

## 2018-06-06 DIAGNOSIS — A419 Sepsis, unspecified organism: Secondary | ICD-10-CM | POA: Diagnosis not present

## 2018-06-06 DIAGNOSIS — R1312 Dysphagia, oropharyngeal phase: Secondary | ICD-10-CM | POA: Diagnosis not present

## 2018-06-06 DIAGNOSIS — R293 Abnormal posture: Secondary | ICD-10-CM | POA: Diagnosis not present

## 2018-06-06 DIAGNOSIS — D649 Anemia, unspecified: Secondary | ICD-10-CM | POA: Diagnosis not present

## 2018-06-10 DIAGNOSIS — R1312 Dysphagia, oropharyngeal phase: Secondary | ICD-10-CM | POA: Diagnosis not present

## 2018-06-10 DIAGNOSIS — D649 Anemia, unspecified: Secondary | ICD-10-CM | POA: Diagnosis not present

## 2018-06-10 DIAGNOSIS — R293 Abnormal posture: Secondary | ICD-10-CM | POA: Diagnosis not present

## 2018-06-10 DIAGNOSIS — A419 Sepsis, unspecified organism: Secondary | ICD-10-CM | POA: Diagnosis not present

## 2018-06-10 DIAGNOSIS — Z8673 Personal history of transient ischemic attack (TIA), and cerebral infarction without residual deficits: Secondary | ICD-10-CM | POA: Diagnosis not present

## 2018-06-11 DIAGNOSIS — R293 Abnormal posture: Secondary | ICD-10-CM | POA: Diagnosis not present

## 2018-06-11 DIAGNOSIS — Z8673 Personal history of transient ischemic attack (TIA), and cerebral infarction without residual deficits: Secondary | ICD-10-CM | POA: Diagnosis not present

## 2018-06-11 DIAGNOSIS — D649 Anemia, unspecified: Secondary | ICD-10-CM | POA: Diagnosis not present

## 2018-06-11 DIAGNOSIS — A419 Sepsis, unspecified organism: Secondary | ICD-10-CM | POA: Diagnosis not present

## 2018-06-11 DIAGNOSIS — R1312 Dysphagia, oropharyngeal phase: Secondary | ICD-10-CM | POA: Diagnosis not present

## 2018-07-12 DIAGNOSIS — R05 Cough: Secondary | ICD-10-CM | POA: Diagnosis not present

## 2018-07-13 DIAGNOSIS — R0609 Other forms of dyspnea: Secondary | ICD-10-CM | POA: Diagnosis not present

## 2018-07-13 DIAGNOSIS — R1319 Other dysphagia: Secondary | ICD-10-CM | POA: Diagnosis not present

## 2018-07-13 DIAGNOSIS — I10 Essential (primary) hypertension: Secondary | ICD-10-CM | POA: Diagnosis not present

## 2018-07-13 DIAGNOSIS — I2510 Atherosclerotic heart disease of native coronary artery without angina pectoris: Secondary | ICD-10-CM | POA: Diagnosis not present

## 2018-07-13 DIAGNOSIS — G309 Alzheimer's disease, unspecified: Secondary | ICD-10-CM | POA: Diagnosis not present

## 2018-07-13 DIAGNOSIS — R05 Cough: Secondary | ICD-10-CM | POA: Diagnosis not present

## 2018-07-13 DIAGNOSIS — D649 Anemia, unspecified: Secondary | ICD-10-CM | POA: Diagnosis not present

## 2018-07-14 DIAGNOSIS — A419 Sepsis, unspecified organism: Secondary | ICD-10-CM | POA: Diagnosis not present

## 2018-07-14 DIAGNOSIS — R1310 Dysphagia, unspecified: Secondary | ICD-10-CM | POA: Diagnosis not present

## 2018-07-14 DIAGNOSIS — R1312 Dysphagia, oropharyngeal phase: Secondary | ICD-10-CM | POA: Diagnosis not present

## 2018-07-15 DIAGNOSIS — R1312 Dysphagia, oropharyngeal phase: Secondary | ICD-10-CM | POA: Diagnosis not present

## 2018-07-15 DIAGNOSIS — R1310 Dysphagia, unspecified: Secondary | ICD-10-CM | POA: Diagnosis not present

## 2018-07-15 DIAGNOSIS — A419 Sepsis, unspecified organism: Secondary | ICD-10-CM | POA: Diagnosis not present

## 2018-07-18 DIAGNOSIS — R1312 Dysphagia, oropharyngeal phase: Secondary | ICD-10-CM | POA: Diagnosis not present

## 2018-07-18 DIAGNOSIS — R1310 Dysphagia, unspecified: Secondary | ICD-10-CM | POA: Diagnosis not present

## 2018-07-18 DIAGNOSIS — A419 Sepsis, unspecified organism: Secondary | ICD-10-CM | POA: Diagnosis not present

## 2018-07-21 DIAGNOSIS — R1312 Dysphagia, oropharyngeal phase: Secondary | ICD-10-CM | POA: Diagnosis not present

## 2018-07-21 DIAGNOSIS — R1310 Dysphagia, unspecified: Secondary | ICD-10-CM | POA: Diagnosis not present

## 2018-07-21 DIAGNOSIS — A419 Sepsis, unspecified organism: Secondary | ICD-10-CM | POA: Diagnosis not present

## 2018-07-22 DIAGNOSIS — R1310 Dysphagia, unspecified: Secondary | ICD-10-CM | POA: Diagnosis not present

## 2018-07-22 DIAGNOSIS — R1312 Dysphagia, oropharyngeal phase: Secondary | ICD-10-CM | POA: Diagnosis not present

## 2018-07-22 DIAGNOSIS — A419 Sepsis, unspecified organism: Secondary | ICD-10-CM | POA: Diagnosis not present

## 2018-07-23 DIAGNOSIS — R1310 Dysphagia, unspecified: Secondary | ICD-10-CM | POA: Diagnosis not present

## 2018-07-23 DIAGNOSIS — A419 Sepsis, unspecified organism: Secondary | ICD-10-CM | POA: Diagnosis not present

## 2018-07-23 DIAGNOSIS — R1312 Dysphagia, oropharyngeal phase: Secondary | ICD-10-CM | POA: Diagnosis not present

## 2018-09-06 DEATH — deceased
